# Patient Record
Sex: Female | Born: 1940
Health system: Southern US, Community
[De-identification: ages and names within clinical notes are randomized; demographics above are authoritative.]

## PROBLEM LIST (undated history)

## (undated) DIAGNOSIS — I639 Cerebral infarction, unspecified: Secondary | ICD-10-CM

## (undated) DIAGNOSIS — I219 Acute myocardial infarction, unspecified: Secondary | ICD-10-CM

## (undated) DIAGNOSIS — M1990 Unspecified osteoarthritis, unspecified site: Secondary | ICD-10-CM

## (undated) HISTORY — DX: Cerebral infarction, unspecified: I63.9

## (undated) HISTORY — PX: TOTAL KNEE ARTHROPLASTY: SHX125

## (undated) HISTORY — PX: COLON SURGERY: SHX602

## (undated) HISTORY — PX: JOINT REPLACEMENT: SHX530

---
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
## 2015-12-03 DIAGNOSIS — I10 Essential (primary) hypertension: Secondary | ICD-10-CM | POA: Diagnosis not present

## 2015-12-03 DIAGNOSIS — M5417 Radiculopathy, lumbosacral region: Secondary | ICD-10-CM | POA: Diagnosis not present

## 2015-12-03 DIAGNOSIS — Z6832 Body mass index (BMI) 32.0-32.9, adult: Secondary | ICD-10-CM | POA: Diagnosis not present

## 2015-12-03 DIAGNOSIS — R42 Dizziness and giddiness: Secondary | ICD-10-CM | POA: Diagnosis not present

## 2015-12-03 DIAGNOSIS — M25562 Pain in left knee: Secondary | ICD-10-CM | POA: Diagnosis not present

## 2015-12-03 DIAGNOSIS — E8881 Metabolic syndrome: Secondary | ICD-10-CM | POA: Diagnosis not present

## 2015-12-03 DIAGNOSIS — M810 Age-related osteoporosis without current pathological fracture: Secondary | ICD-10-CM | POA: Diagnosis not present

## 2015-12-03 DIAGNOSIS — R946 Abnormal results of thyroid function studies: Secondary | ICD-10-CM | POA: Diagnosis not present

## 2015-12-03 DIAGNOSIS — M159 Polyosteoarthritis, unspecified: Secondary | ICD-10-CM | POA: Diagnosis not present

## 2015-12-09 DIAGNOSIS — H8110 Benign paroxysmal vertigo, unspecified ear: Secondary | ICD-10-CM | POA: Diagnosis not present

## 2015-12-09 DIAGNOSIS — R42 Dizziness and giddiness: Secondary | ICD-10-CM | POA: Diagnosis not present

## 2015-12-10 DIAGNOSIS — M5417 Radiculopathy, lumbosacral region: Secondary | ICD-10-CM | POA: Diagnosis not present

## 2015-12-10 DIAGNOSIS — M25562 Pain in left knee: Secondary | ICD-10-CM | POA: Diagnosis not present

## 2015-12-10 DIAGNOSIS — M159 Polyosteoarthritis, unspecified: Secondary | ICD-10-CM | POA: Diagnosis not present

## 2015-12-10 DIAGNOSIS — M810 Age-related osteoporosis without current pathological fracture: Secondary | ICD-10-CM | POA: Diagnosis not present

## 2015-12-10 DIAGNOSIS — I10 Essential (primary) hypertension: Secondary | ICD-10-CM | POA: Diagnosis not present

## 2015-12-10 DIAGNOSIS — R946 Abnormal results of thyroid function studies: Secondary | ICD-10-CM | POA: Diagnosis not present

## 2015-12-10 DIAGNOSIS — E8881 Metabolic syndrome: Secondary | ICD-10-CM | POA: Diagnosis not present

## 2015-12-10 DIAGNOSIS — I2510 Atherosclerotic heart disease of native coronary artery without angina pectoris: Secondary | ICD-10-CM | POA: Diagnosis not present

## 2015-12-10 DIAGNOSIS — Z6832 Body mass index (BMI) 32.0-32.9, adult: Secondary | ICD-10-CM | POA: Diagnosis not present

## 2015-12-10 DIAGNOSIS — R42 Dizziness and giddiness: Secondary | ICD-10-CM | POA: Diagnosis not present

## 2015-12-16 DIAGNOSIS — R42 Dizziness and giddiness: Secondary | ICD-10-CM | POA: Diagnosis not present

## 2015-12-16 DIAGNOSIS — H8112 Benign paroxysmal vertigo, left ear: Secondary | ICD-10-CM | POA: Diagnosis not present

## 2015-12-23 DIAGNOSIS — M1712 Unilateral primary osteoarthritis, left knee: Secondary | ICD-10-CM | POA: Diagnosis not present

## 2016-01-06 DIAGNOSIS — R42 Dizziness and giddiness: Secondary | ICD-10-CM | POA: Diagnosis not present

## 2016-01-06 DIAGNOSIS — H8112 Benign paroxysmal vertigo, left ear: Secondary | ICD-10-CM | POA: Diagnosis not present

## 2016-01-13 DIAGNOSIS — H8112 Benign paroxysmal vertigo, left ear: Secondary | ICD-10-CM | POA: Diagnosis not present

## 2016-03-11 DIAGNOSIS — R946 Abnormal results of thyroid function studies: Secondary | ICD-10-CM | POA: Diagnosis not present

## 2016-03-11 DIAGNOSIS — Z9181 History of falling: Secondary | ICD-10-CM | POA: Diagnosis not present

## 2016-03-11 DIAGNOSIS — Z6832 Body mass index (BMI) 32.0-32.9, adult: Secondary | ICD-10-CM | POA: Diagnosis not present

## 2016-03-11 DIAGNOSIS — Z1389 Encounter for screening for other disorder: Secondary | ICD-10-CM | POA: Diagnosis not present

## 2016-03-11 DIAGNOSIS — M5417 Radiculopathy, lumbosacral region: Secondary | ICD-10-CM | POA: Diagnosis not present

## 2016-03-11 DIAGNOSIS — M810 Age-related osteoporosis without current pathological fracture: Secondary | ICD-10-CM | POA: Diagnosis not present

## 2016-03-11 DIAGNOSIS — E8881 Metabolic syndrome: Secondary | ICD-10-CM | POA: Diagnosis not present

## 2016-03-11 DIAGNOSIS — I10 Essential (primary) hypertension: Secondary | ICD-10-CM | POA: Diagnosis not present

## 2016-03-11 DIAGNOSIS — M159 Polyosteoarthritis, unspecified: Secondary | ICD-10-CM | POA: Diagnosis not present

## 2016-03-11 DIAGNOSIS — R42 Dizziness and giddiness: Secondary | ICD-10-CM | POA: Diagnosis not present

## 2016-05-03 DIAGNOSIS — M1712 Unilateral primary osteoarthritis, left knee: Secondary | ICD-10-CM | POA: Diagnosis not present

## 2016-05-05 DIAGNOSIS — Z79899 Other long term (current) drug therapy: Secondary | ICD-10-CM | POA: Diagnosis not present

## 2016-05-05 DIAGNOSIS — M79609 Pain in unspecified limb: Secondary | ICD-10-CM | POA: Diagnosis not present

## 2016-05-05 DIAGNOSIS — Z01818 Encounter for other preprocedural examination: Secondary | ICD-10-CM | POA: Diagnosis not present

## 2016-05-05 DIAGNOSIS — R52 Pain, unspecified: Secondary | ICD-10-CM | POA: Diagnosis not present

## 2016-05-05 DIAGNOSIS — Z01810 Encounter for preprocedural cardiovascular examination: Secondary | ICD-10-CM | POA: Diagnosis not present

## 2016-05-10 DIAGNOSIS — E785 Hyperlipidemia, unspecified: Secondary | ICD-10-CM | POA: Diagnosis not present

## 2016-05-10 DIAGNOSIS — E8881 Metabolic syndrome: Secondary | ICD-10-CM | POA: Diagnosis not present

## 2016-05-10 DIAGNOSIS — E669 Obesity, unspecified: Secondary | ICD-10-CM | POA: Diagnosis not present

## 2016-05-10 DIAGNOSIS — Z01810 Encounter for preprocedural cardiovascular examination: Secondary | ICD-10-CM | POA: Diagnosis not present

## 2016-05-10 DIAGNOSIS — M5417 Radiculopathy, lumbosacral region: Secondary | ICD-10-CM | POA: Diagnosis not present

## 2016-05-10 DIAGNOSIS — I2510 Atherosclerotic heart disease of native coronary artery without angina pectoris: Secondary | ICD-10-CM

## 2016-05-10 DIAGNOSIS — M159 Polyosteoarthritis, unspecified: Secondary | ICD-10-CM | POA: Diagnosis not present

## 2016-05-10 DIAGNOSIS — Z01818 Encounter for other preprocedural examination: Secondary | ICD-10-CM | POA: Diagnosis not present

## 2016-05-10 DIAGNOSIS — R42 Dizziness and giddiness: Secondary | ICD-10-CM | POA: Diagnosis not present

## 2016-05-10 DIAGNOSIS — I10 Essential (primary) hypertension: Secondary | ICD-10-CM | POA: Diagnosis not present

## 2016-05-10 DIAGNOSIS — R946 Abnormal results of thyroid function studies: Secondary | ICD-10-CM | POA: Diagnosis not present

## 2016-05-10 DIAGNOSIS — M810 Age-related osteoporosis without current pathological fracture: Secondary | ICD-10-CM | POA: Diagnosis not present

## 2016-05-10 HISTORY — DX: Atherosclerotic heart disease of native coronary artery without angina pectoris: I25.10

## 2016-05-11 DIAGNOSIS — I209 Angina pectoris, unspecified: Secondary | ICD-10-CM | POA: Diagnosis not present

## 2016-05-11 DIAGNOSIS — M059 Rheumatoid arthritis with rheumatoid factor, unspecified: Secondary | ICD-10-CM | POA: Diagnosis not present

## 2016-05-11 DIAGNOSIS — Z0000 Encounter for general adult medical examination without abnormal findings: Secondary | ICD-10-CM | POA: Diagnosis not present

## 2016-05-11 DIAGNOSIS — I252 Old myocardial infarction: Secondary | ICD-10-CM | POA: Diagnosis not present

## 2016-05-14 DIAGNOSIS — R42 Dizziness and giddiness: Secondary | ICD-10-CM | POA: Diagnosis not present

## 2016-05-14 DIAGNOSIS — I252 Old myocardial infarction: Secondary | ICD-10-CM | POA: Diagnosis not present

## 2016-05-14 DIAGNOSIS — R0789 Other chest pain: Secondary | ICD-10-CM | POA: Diagnosis not present

## 2016-05-14 DIAGNOSIS — I10 Essential (primary) hypertension: Secondary | ICD-10-CM | POA: Diagnosis not present

## 2016-05-21 DIAGNOSIS — Z01810 Encounter for preprocedural cardiovascular examination: Secondary | ICD-10-CM | POA: Diagnosis not present

## 2016-05-21 DIAGNOSIS — R079 Chest pain, unspecified: Secondary | ICD-10-CM | POA: Diagnosis not present

## 2016-05-21 DIAGNOSIS — Z01818 Encounter for other preprocedural examination: Secondary | ICD-10-CM | POA: Diagnosis not present

## 2016-05-21 DIAGNOSIS — E785 Hyperlipidemia, unspecified: Secondary | ICD-10-CM | POA: Diagnosis not present

## 2016-05-21 DIAGNOSIS — I2510 Atherosclerotic heart disease of native coronary artery without angina pectoris: Secondary | ICD-10-CM | POA: Diagnosis not present

## 2016-06-01 DIAGNOSIS — G43109 Migraine with aura, not intractable, without status migrainosus: Secondary | ICD-10-CM | POA: Diagnosis not present

## 2016-06-01 DIAGNOSIS — H8112 Benign paroxysmal vertigo, left ear: Secondary | ICD-10-CM | POA: Diagnosis not present

## 2016-06-04 DIAGNOSIS — M1712 Unilateral primary osteoarthritis, left knee: Secondary | ICD-10-CM | POA: Diagnosis not present

## 2016-06-04 DIAGNOSIS — Z01818 Encounter for other preprocedural examination: Secondary | ICD-10-CM | POA: Diagnosis not present

## 2016-06-08 DIAGNOSIS — H8112 Benign paroxysmal vertigo, left ear: Secondary | ICD-10-CM | POA: Diagnosis not present

## 2016-06-08 DIAGNOSIS — R51 Headache: Secondary | ICD-10-CM | POA: Diagnosis not present

## 2016-06-08 DIAGNOSIS — H903 Sensorineural hearing loss, bilateral: Secondary | ICD-10-CM | POA: Diagnosis not present

## 2016-06-10 DIAGNOSIS — E785 Hyperlipidemia, unspecified: Secondary | ICD-10-CM | POA: Diagnosis not present

## 2016-06-10 DIAGNOSIS — Z01818 Encounter for other preprocedural examination: Secondary | ICD-10-CM | POA: Diagnosis not present

## 2016-06-10 DIAGNOSIS — I2510 Atherosclerotic heart disease of native coronary artery without angina pectoris: Secondary | ICD-10-CM | POA: Diagnosis not present

## 2016-06-15 DIAGNOSIS — H8112 Benign paroxysmal vertigo, left ear: Secondary | ICD-10-CM | POA: Diagnosis not present

## 2016-06-15 DIAGNOSIS — R42 Dizziness and giddiness: Secondary | ICD-10-CM | POA: Diagnosis not present

## 2016-06-16 DIAGNOSIS — H905 Unspecified sensorineural hearing loss: Secondary | ICD-10-CM | POA: Diagnosis not present

## 2016-06-16 DIAGNOSIS — I252 Old myocardial infarction: Secondary | ICD-10-CM | POA: Diagnosis not present

## 2016-06-16 DIAGNOSIS — Z9861 Coronary angioplasty status: Secondary | ICD-10-CM | POA: Diagnosis not present

## 2016-06-16 DIAGNOSIS — M1712 Unilateral primary osteoarthritis, left knee: Secondary | ICD-10-CM | POA: Diagnosis not present

## 2016-06-16 DIAGNOSIS — G8918 Other acute postprocedural pain: Secondary | ICD-10-CM | POA: Diagnosis not present

## 2016-06-16 DIAGNOSIS — E079 Disorder of thyroid, unspecified: Secondary | ICD-10-CM | POA: Diagnosis not present

## 2016-06-16 DIAGNOSIS — Z96652 Presence of left artificial knee joint: Secondary | ICD-10-CM | POA: Diagnosis not present

## 2016-06-16 DIAGNOSIS — Z882 Allergy status to sulfonamides status: Secondary | ICD-10-CM | POA: Diagnosis not present

## 2016-06-16 DIAGNOSIS — Z86718 Personal history of other venous thrombosis and embolism: Secondary | ICD-10-CM | POA: Diagnosis not present

## 2016-06-16 DIAGNOSIS — Z471 Aftercare following joint replacement surgery: Secondary | ICD-10-CM | POA: Diagnosis not present

## 2016-06-16 DIAGNOSIS — I2510 Atherosclerotic heart disease of native coronary artery without angina pectoris: Secondary | ICD-10-CM | POA: Diagnosis not present

## 2016-06-19 DIAGNOSIS — Z471 Aftercare following joint replacement surgery: Secondary | ICD-10-CM | POA: Diagnosis not present

## 2016-06-21 DIAGNOSIS — Z471 Aftercare following joint replacement surgery: Secondary | ICD-10-CM | POA: Diagnosis not present

## 2016-06-22 DIAGNOSIS — Z471 Aftercare following joint replacement surgery: Secondary | ICD-10-CM | POA: Diagnosis not present

## 2016-06-23 DIAGNOSIS — Z471 Aftercare following joint replacement surgery: Secondary | ICD-10-CM | POA: Diagnosis not present

## 2016-06-24 DIAGNOSIS — Z471 Aftercare following joint replacement surgery: Secondary | ICD-10-CM | POA: Diagnosis not present

## 2016-06-25 DIAGNOSIS — R600 Localized edema: Secondary | ICD-10-CM | POA: Diagnosis not present

## 2016-06-25 DIAGNOSIS — M79662 Pain in left lower leg: Secondary | ICD-10-CM | POA: Diagnosis not present

## 2016-06-25 DIAGNOSIS — Z471 Aftercare following joint replacement surgery: Secondary | ICD-10-CM | POA: Diagnosis not present

## 2016-06-28 DIAGNOSIS — Z471 Aftercare following joint replacement surgery: Secondary | ICD-10-CM | POA: Diagnosis not present

## 2016-07-29 DIAGNOSIS — M25662 Stiffness of left knee, not elsewhere classified: Secondary | ICD-10-CM | POA: Diagnosis not present

## 2016-07-29 DIAGNOSIS — R262 Difficulty in walking, not elsewhere classified: Secondary | ICD-10-CM | POA: Diagnosis not present

## 2016-07-29 DIAGNOSIS — M25562 Pain in left knee: Secondary | ICD-10-CM | POA: Diagnosis not present

## 2016-07-30 DIAGNOSIS — Z96652 Presence of left artificial knee joint: Secondary | ICD-10-CM | POA: Diagnosis not present

## 2016-08-03 DIAGNOSIS — M25662 Stiffness of left knee, not elsewhere classified: Secondary | ICD-10-CM | POA: Diagnosis not present

## 2016-08-03 DIAGNOSIS — R262 Difficulty in walking, not elsewhere classified: Secondary | ICD-10-CM | POA: Diagnosis not present

## 2016-08-03 DIAGNOSIS — M25562 Pain in left knee: Secondary | ICD-10-CM | POA: Diagnosis not present

## 2016-08-05 DIAGNOSIS — M25562 Pain in left knee: Secondary | ICD-10-CM | POA: Diagnosis not present

## 2016-08-05 DIAGNOSIS — R262 Difficulty in walking, not elsewhere classified: Secondary | ICD-10-CM | POA: Diagnosis not present

## 2016-08-05 DIAGNOSIS — M25662 Stiffness of left knee, not elsewhere classified: Secondary | ICD-10-CM | POA: Diagnosis not present

## 2016-08-10 DIAGNOSIS — R262 Difficulty in walking, not elsewhere classified: Secondary | ICD-10-CM | POA: Diagnosis not present

## 2016-08-10 DIAGNOSIS — M25662 Stiffness of left knee, not elsewhere classified: Secondary | ICD-10-CM | POA: Diagnosis not present

## 2016-08-10 DIAGNOSIS — M25562 Pain in left knee: Secondary | ICD-10-CM | POA: Diagnosis not present

## 2016-09-10 DIAGNOSIS — M5417 Radiculopathy, lumbosacral region: Secondary | ICD-10-CM | POA: Diagnosis not present

## 2016-09-10 DIAGNOSIS — Z96652 Presence of left artificial knee joint: Secondary | ICD-10-CM | POA: Diagnosis not present

## 2016-09-10 DIAGNOSIS — M1712 Unilateral primary osteoarthritis, left knee: Secondary | ICD-10-CM | POA: Diagnosis not present

## 2016-09-24 DIAGNOSIS — M48061 Spinal stenosis, lumbar region without neurogenic claudication: Secondary | ICD-10-CM | POA: Diagnosis not present

## 2016-09-24 DIAGNOSIS — M5416 Radiculopathy, lumbar region: Secondary | ICD-10-CM | POA: Diagnosis not present

## 2016-09-28 DIAGNOSIS — Z96652 Presence of left artificial knee joint: Secondary | ICD-10-CM | POA: Diagnosis not present

## 2016-09-28 DIAGNOSIS — M1712 Unilateral primary osteoarthritis, left knee: Secondary | ICD-10-CM | POA: Diagnosis not present

## 2016-09-28 DIAGNOSIS — M5417 Radiculopathy, lumbosacral region: Secondary | ICD-10-CM | POA: Diagnosis not present

## 2016-10-06 DIAGNOSIS — M5417 Radiculopathy, lumbosacral region: Secondary | ICD-10-CM | POA: Diagnosis not present

## 2016-12-10 DIAGNOSIS — M5417 Radiculopathy, lumbosacral region: Secondary | ICD-10-CM | POA: Diagnosis not present

## 2016-12-10 DIAGNOSIS — Z96652 Presence of left artificial knee joint: Secondary | ICD-10-CM | POA: Diagnosis not present

## 2016-12-10 DIAGNOSIS — M1712 Unilateral primary osteoarthritis, left knee: Secondary | ICD-10-CM | POA: Diagnosis not present

## 2017-06-13 DIAGNOSIS — Z96652 Presence of left artificial knee joint: Secondary | ICD-10-CM | POA: Diagnosis not present

## 2018-06-01 DIAGNOSIS — M1711 Unilateral primary osteoarthritis, right knee: Secondary | ICD-10-CM | POA: Diagnosis not present

## 2018-06-08 DIAGNOSIS — R52 Pain, unspecified: Secondary | ICD-10-CM | POA: Diagnosis not present

## 2018-06-08 DIAGNOSIS — Z79899 Other long term (current) drug therapy: Secondary | ICD-10-CM | POA: Diagnosis not present

## 2018-06-08 DIAGNOSIS — Z01818 Encounter for other preprocedural examination: Secondary | ICD-10-CM | POA: Diagnosis not present

## 2018-06-08 DIAGNOSIS — J9811 Atelectasis: Secondary | ICD-10-CM | POA: Diagnosis not present

## 2018-06-08 DIAGNOSIS — M79609 Pain in unspecified limb: Secondary | ICD-10-CM | POA: Diagnosis not present

## 2018-06-13 DIAGNOSIS — Z01818 Encounter for other preprocedural examination: Secondary | ICD-10-CM | POA: Diagnosis not present

## 2018-06-13 DIAGNOSIS — I2510 Atherosclerotic heart disease of native coronary artery without angina pectoris: Secondary | ICD-10-CM | POA: Diagnosis not present

## 2018-06-13 DIAGNOSIS — L2081 Atopic neurodermatitis: Secondary | ICD-10-CM | POA: Diagnosis not present

## 2018-06-13 DIAGNOSIS — M1711 Unilateral primary osteoarthritis, right knee: Secondary | ICD-10-CM | POA: Diagnosis not present

## 2018-06-14 DIAGNOSIS — Z01810 Encounter for preprocedural cardiovascular examination: Secondary | ICD-10-CM | POA: Diagnosis not present

## 2018-07-05 ENCOUNTER — Ambulatory Visit: Payer: Medicare HMO | Admitting: Cardiology

## 2018-07-19 DIAGNOSIS — Z86718 Personal history of other venous thrombosis and embolism: Secondary | ICD-10-CM | POA: Diagnosis not present

## 2018-07-19 DIAGNOSIS — G8918 Other acute postprocedural pain: Secondary | ICD-10-CM | POA: Diagnosis not present

## 2018-07-19 DIAGNOSIS — Z8679 Personal history of other diseases of the circulatory system: Secondary | ICD-10-CM | POA: Diagnosis not present

## 2018-07-19 DIAGNOSIS — Z23 Encounter for immunization: Secondary | ICD-10-CM | POA: Diagnosis not present

## 2018-07-19 DIAGNOSIS — Z96652 Presence of left artificial knee joint: Secondary | ICD-10-CM | POA: Diagnosis not present

## 2018-07-19 DIAGNOSIS — I2510 Atherosclerotic heart disease of native coronary artery without angina pectoris: Secondary | ICD-10-CM | POA: Diagnosis not present

## 2018-07-19 DIAGNOSIS — M1711 Unilateral primary osteoarthritis, right knee: Secondary | ICD-10-CM | POA: Diagnosis not present

## 2018-07-19 DIAGNOSIS — Z7982 Long term (current) use of aspirin: Secondary | ICD-10-CM | POA: Diagnosis not present

## 2018-07-19 DIAGNOSIS — Z96651 Presence of right artificial knee joint: Secondary | ICD-10-CM | POA: Diagnosis not present

## 2018-07-19 DIAGNOSIS — I252 Old myocardial infarction: Secondary | ICD-10-CM | POA: Diagnosis not present

## 2018-07-19 DIAGNOSIS — I10 Essential (primary) hypertension: Secondary | ICD-10-CM | POA: Diagnosis not present

## 2018-07-19 DIAGNOSIS — Z471 Aftercare following joint replacement surgery: Secondary | ICD-10-CM | POA: Diagnosis not present

## 2018-07-22 DIAGNOSIS — Z79891 Long term (current) use of opiate analgesic: Secondary | ICD-10-CM | POA: Diagnosis not present

## 2018-07-22 DIAGNOSIS — Z471 Aftercare following joint replacement surgery: Secondary | ICD-10-CM | POA: Diagnosis not present

## 2018-07-22 DIAGNOSIS — I2510 Atherosclerotic heart disease of native coronary artery without angina pectoris: Secondary | ICD-10-CM | POA: Diagnosis not present

## 2018-07-22 DIAGNOSIS — M1991 Primary osteoarthritis, unspecified site: Secondary | ICD-10-CM | POA: Diagnosis not present

## 2018-07-22 DIAGNOSIS — Z7901 Long term (current) use of anticoagulants: Secondary | ICD-10-CM | POA: Diagnosis not present

## 2018-07-22 DIAGNOSIS — Z9181 History of falling: Secondary | ICD-10-CM | POA: Diagnosis not present

## 2018-07-22 DIAGNOSIS — Z96653 Presence of artificial knee joint, bilateral: Secondary | ICD-10-CM | POA: Diagnosis not present

## 2018-07-22 DIAGNOSIS — H8112 Benign paroxysmal vertigo, left ear: Secondary | ICD-10-CM | POA: Diagnosis not present

## 2018-07-22 DIAGNOSIS — M5416 Radiculopathy, lumbar region: Secondary | ICD-10-CM | POA: Diagnosis not present

## 2018-07-22 DIAGNOSIS — H902 Conductive hearing loss, unspecified: Secondary | ICD-10-CM | POA: Diagnosis not present

## 2018-07-25 DIAGNOSIS — I2510 Atherosclerotic heart disease of native coronary artery without angina pectoris: Secondary | ICD-10-CM | POA: Diagnosis not present

## 2018-07-25 DIAGNOSIS — Z79891 Long term (current) use of opiate analgesic: Secondary | ICD-10-CM | POA: Diagnosis not present

## 2018-07-25 DIAGNOSIS — M1991 Primary osteoarthritis, unspecified site: Secondary | ICD-10-CM | POA: Diagnosis not present

## 2018-07-25 DIAGNOSIS — H902 Conductive hearing loss, unspecified: Secondary | ICD-10-CM | POA: Diagnosis not present

## 2018-07-25 DIAGNOSIS — Z9181 History of falling: Secondary | ICD-10-CM | POA: Diagnosis not present

## 2018-07-25 DIAGNOSIS — Z471 Aftercare following joint replacement surgery: Secondary | ICD-10-CM | POA: Diagnosis not present

## 2018-07-25 DIAGNOSIS — Z96653 Presence of artificial knee joint, bilateral: Secondary | ICD-10-CM | POA: Diagnosis not present

## 2018-07-25 DIAGNOSIS — M5416 Radiculopathy, lumbar region: Secondary | ICD-10-CM | POA: Diagnosis not present

## 2018-07-25 DIAGNOSIS — H8112 Benign paroxysmal vertigo, left ear: Secondary | ICD-10-CM | POA: Diagnosis not present

## 2018-07-25 DIAGNOSIS — Z7901 Long term (current) use of anticoagulants: Secondary | ICD-10-CM | POA: Diagnosis not present

## 2018-07-26 DIAGNOSIS — H902 Conductive hearing loss, unspecified: Secondary | ICD-10-CM | POA: Diagnosis not present

## 2018-07-26 DIAGNOSIS — Z471 Aftercare following joint replacement surgery: Secondary | ICD-10-CM | POA: Diagnosis not present

## 2018-07-26 DIAGNOSIS — Z7901 Long term (current) use of anticoagulants: Secondary | ICD-10-CM | POA: Diagnosis not present

## 2018-07-26 DIAGNOSIS — I2510 Atherosclerotic heart disease of native coronary artery without angina pectoris: Secondary | ICD-10-CM | POA: Diagnosis not present

## 2018-07-26 DIAGNOSIS — Z9181 History of falling: Secondary | ICD-10-CM | POA: Diagnosis not present

## 2018-07-26 DIAGNOSIS — M5416 Radiculopathy, lumbar region: Secondary | ICD-10-CM | POA: Diagnosis not present

## 2018-07-26 DIAGNOSIS — H8112 Benign paroxysmal vertigo, left ear: Secondary | ICD-10-CM | POA: Diagnosis not present

## 2018-07-26 DIAGNOSIS — M1991 Primary osteoarthritis, unspecified site: Secondary | ICD-10-CM | POA: Diagnosis not present

## 2018-07-26 DIAGNOSIS — Z96653 Presence of artificial knee joint, bilateral: Secondary | ICD-10-CM | POA: Diagnosis not present

## 2018-07-26 DIAGNOSIS — Z79891 Long term (current) use of opiate analgesic: Secondary | ICD-10-CM | POA: Diagnosis not present

## 2018-07-27 DIAGNOSIS — M1991 Primary osteoarthritis, unspecified site: Secondary | ICD-10-CM | POA: Diagnosis not present

## 2018-07-27 DIAGNOSIS — Z7901 Long term (current) use of anticoagulants: Secondary | ICD-10-CM | POA: Diagnosis not present

## 2018-07-27 DIAGNOSIS — M5416 Radiculopathy, lumbar region: Secondary | ICD-10-CM | POA: Diagnosis not present

## 2018-07-27 DIAGNOSIS — I2510 Atherosclerotic heart disease of native coronary artery without angina pectoris: Secondary | ICD-10-CM | POA: Diagnosis not present

## 2018-07-27 DIAGNOSIS — H8112 Benign paroxysmal vertigo, left ear: Secondary | ICD-10-CM | POA: Diagnosis not present

## 2018-07-27 DIAGNOSIS — Z79891 Long term (current) use of opiate analgesic: Secondary | ICD-10-CM | POA: Diagnosis not present

## 2018-07-27 DIAGNOSIS — H902 Conductive hearing loss, unspecified: Secondary | ICD-10-CM | POA: Diagnosis not present

## 2018-07-27 DIAGNOSIS — Z96653 Presence of artificial knee joint, bilateral: Secondary | ICD-10-CM | POA: Diagnosis not present

## 2018-07-27 DIAGNOSIS — Z471 Aftercare following joint replacement surgery: Secondary | ICD-10-CM | POA: Diagnosis not present

## 2018-07-27 DIAGNOSIS — Z9181 History of falling: Secondary | ICD-10-CM | POA: Diagnosis not present

## 2018-08-01 DIAGNOSIS — M1991 Primary osteoarthritis, unspecified site: Secondary | ICD-10-CM | POA: Diagnosis not present

## 2018-08-01 DIAGNOSIS — Z96653 Presence of artificial knee joint, bilateral: Secondary | ICD-10-CM | POA: Diagnosis not present

## 2018-08-01 DIAGNOSIS — Z79891 Long term (current) use of opiate analgesic: Secondary | ICD-10-CM | POA: Diagnosis not present

## 2018-08-01 DIAGNOSIS — M5416 Radiculopathy, lumbar region: Secondary | ICD-10-CM | POA: Diagnosis not present

## 2018-08-01 DIAGNOSIS — I2510 Atherosclerotic heart disease of native coronary artery without angina pectoris: Secondary | ICD-10-CM | POA: Diagnosis not present

## 2018-08-01 DIAGNOSIS — Z471 Aftercare following joint replacement surgery: Secondary | ICD-10-CM | POA: Diagnosis not present

## 2018-08-01 DIAGNOSIS — H902 Conductive hearing loss, unspecified: Secondary | ICD-10-CM | POA: Diagnosis not present

## 2018-08-01 DIAGNOSIS — Z7901 Long term (current) use of anticoagulants: Secondary | ICD-10-CM | POA: Diagnosis not present

## 2018-08-01 DIAGNOSIS — Z9181 History of falling: Secondary | ICD-10-CM | POA: Diagnosis not present

## 2018-08-01 DIAGNOSIS — H8112 Benign paroxysmal vertigo, left ear: Secondary | ICD-10-CM | POA: Diagnosis not present

## 2018-08-02 DIAGNOSIS — M1711 Unilateral primary osteoarthritis, right knee: Secondary | ICD-10-CM | POA: Diagnosis not present

## 2018-08-02 DIAGNOSIS — M25561 Pain in right knee: Secondary | ICD-10-CM | POA: Diagnosis not present

## 2018-08-04 DIAGNOSIS — M1711 Unilateral primary osteoarthritis, right knee: Secondary | ICD-10-CM | POA: Diagnosis not present

## 2018-08-04 DIAGNOSIS — M25561 Pain in right knee: Secondary | ICD-10-CM | POA: Diagnosis not present

## 2018-08-07 DIAGNOSIS — M25561 Pain in right knee: Secondary | ICD-10-CM | POA: Diagnosis not present

## 2018-08-07 DIAGNOSIS — M1711 Unilateral primary osteoarthritis, right knee: Secondary | ICD-10-CM | POA: Diagnosis not present

## 2018-08-09 DIAGNOSIS — M25561 Pain in right knee: Secondary | ICD-10-CM | POA: Diagnosis not present

## 2018-08-09 DIAGNOSIS — M1711 Unilateral primary osteoarthritis, right knee: Secondary | ICD-10-CM | POA: Diagnosis not present

## 2018-08-14 DIAGNOSIS — M25561 Pain in right knee: Secondary | ICD-10-CM | POA: Diagnosis not present

## 2018-08-14 DIAGNOSIS — M1711 Unilateral primary osteoarthritis, right knee: Secondary | ICD-10-CM | POA: Diagnosis not present

## 2018-08-16 DIAGNOSIS — M25561 Pain in right knee: Secondary | ICD-10-CM | POA: Diagnosis not present

## 2018-08-16 DIAGNOSIS — M1711 Unilateral primary osteoarthritis, right knee: Secondary | ICD-10-CM | POA: Diagnosis not present

## 2018-08-23 DIAGNOSIS — M25561 Pain in right knee: Secondary | ICD-10-CM | POA: Diagnosis not present

## 2018-08-23 DIAGNOSIS — M1711 Unilateral primary osteoarthritis, right knee: Secondary | ICD-10-CM | POA: Diagnosis not present

## 2018-08-28 DIAGNOSIS — M1711 Unilateral primary osteoarthritis, right knee: Secondary | ICD-10-CM | POA: Diagnosis not present

## 2018-08-28 DIAGNOSIS — M25561 Pain in right knee: Secondary | ICD-10-CM | POA: Diagnosis not present

## 2018-08-29 DIAGNOSIS — M25561 Pain in right knee: Secondary | ICD-10-CM | POA: Diagnosis not present

## 2018-08-29 DIAGNOSIS — Z96651 Presence of right artificial knee joint: Secondary | ICD-10-CM | POA: Diagnosis not present

## 2018-09-01 DIAGNOSIS — M25561 Pain in right knee: Secondary | ICD-10-CM | POA: Diagnosis not present

## 2018-09-01 DIAGNOSIS — M1711 Unilateral primary osteoarthritis, right knee: Secondary | ICD-10-CM | POA: Diagnosis not present

## 2018-09-07 DIAGNOSIS — Z809 Family history of malignant neoplasm, unspecified: Secondary | ICD-10-CM | POA: Diagnosis not present

## 2018-09-07 DIAGNOSIS — Z7982 Long term (current) use of aspirin: Secondary | ICD-10-CM | POA: Diagnosis not present

## 2018-09-07 DIAGNOSIS — I252 Old myocardial infarction: Secondary | ICD-10-CM | POA: Diagnosis not present

## 2018-09-07 DIAGNOSIS — Z803 Family history of malignant neoplasm of breast: Secondary | ICD-10-CM | POA: Diagnosis not present

## 2018-09-07 DIAGNOSIS — E669 Obesity, unspecified: Secondary | ICD-10-CM | POA: Diagnosis not present

## 2018-09-07 DIAGNOSIS — Z6832 Body mass index (BMI) 32.0-32.9, adult: Secondary | ICD-10-CM | POA: Diagnosis not present

## 2018-09-07 DIAGNOSIS — Z8249 Family history of ischemic heart disease and other diseases of the circulatory system: Secondary | ICD-10-CM | POA: Diagnosis not present

## 2018-09-07 DIAGNOSIS — Z833 Family history of diabetes mellitus: Secondary | ICD-10-CM | POA: Diagnosis not present

## 2018-09-07 DIAGNOSIS — M1990 Unspecified osteoarthritis, unspecified site: Secondary | ICD-10-CM | POA: Diagnosis not present

## 2018-09-07 DIAGNOSIS — L089 Local infection of the skin and subcutaneous tissue, unspecified: Secondary | ICD-10-CM | POA: Diagnosis not present

## 2018-09-08 DIAGNOSIS — M545 Low back pain: Secondary | ICD-10-CM | POA: Diagnosis not present

## 2018-09-08 DIAGNOSIS — M47896 Other spondylosis, lumbar region: Secondary | ICD-10-CM | POA: Diagnosis not present

## 2018-09-08 DIAGNOSIS — M5116 Intervertebral disc disorders with radiculopathy, lumbar region: Secondary | ICD-10-CM | POA: Diagnosis not present

## 2018-09-14 DIAGNOSIS — M5416 Radiculopathy, lumbar region: Secondary | ICD-10-CM | POA: Diagnosis not present

## 2018-10-05 DIAGNOSIS — I361 Nonrheumatic tricuspid (valve) insufficiency: Secondary | ICD-10-CM | POA: Diagnosis not present

## 2018-10-05 DIAGNOSIS — I10 Essential (primary) hypertension: Secondary | ICD-10-CM | POA: Diagnosis not present

## 2018-10-05 DIAGNOSIS — G4489 Other headache syndrome: Secondary | ICD-10-CM | POA: Diagnosis not present

## 2018-10-05 DIAGNOSIS — H8110 Benign paroxysmal vertigo, unspecified ear: Secondary | ICD-10-CM | POA: Diagnosis not present

## 2018-10-05 DIAGNOSIS — R531 Weakness: Secondary | ICD-10-CM | POA: Diagnosis not present

## 2018-10-05 DIAGNOSIS — R42 Dizziness and giddiness: Secondary | ICD-10-CM | POA: Diagnosis not present

## 2018-10-05 DIAGNOSIS — I252 Old myocardial infarction: Secondary | ICD-10-CM | POA: Diagnosis not present

## 2018-10-05 DIAGNOSIS — I639 Cerebral infarction, unspecified: Secondary | ICD-10-CM | POA: Diagnosis not present

## 2018-10-05 DIAGNOSIS — I6350 Cerebral infarction due to unspecified occlusion or stenosis of unspecified cerebral artery: Secondary | ICD-10-CM | POA: Diagnosis not present

## 2018-10-05 DIAGNOSIS — R29810 Facial weakness: Secondary | ICD-10-CM | POA: Diagnosis not present

## 2018-10-05 DIAGNOSIS — R51 Headache: Secondary | ICD-10-CM | POA: Diagnosis not present

## 2018-10-05 DIAGNOSIS — Q211 Atrial septal defect: Secondary | ICD-10-CM | POA: Diagnosis not present

## 2018-10-06 DIAGNOSIS — H538 Other visual disturbances: Secondary | ICD-10-CM | POA: Diagnosis not present

## 2018-10-06 DIAGNOSIS — I6523 Occlusion and stenosis of bilateral carotid arteries: Secondary | ICD-10-CM | POA: Diagnosis not present

## 2018-10-06 DIAGNOSIS — R42 Dizziness and giddiness: Secondary | ICD-10-CM | POA: Diagnosis not present

## 2018-10-06 DIAGNOSIS — Q211 Atrial septal defect: Secondary | ICD-10-CM | POA: Diagnosis not present

## 2018-10-06 DIAGNOSIS — I6350 Cerebral infarction due to unspecified occlusion or stenosis of unspecified cerebral artery: Secondary | ICD-10-CM | POA: Diagnosis not present

## 2018-10-06 DIAGNOSIS — R531 Weakness: Secondary | ICD-10-CM | POA: Diagnosis not present

## 2018-10-10 DIAGNOSIS — J342 Deviated nasal septum: Secondary | ICD-10-CM | POA: Diagnosis not present

## 2018-10-10 DIAGNOSIS — R42 Dizziness and giddiness: Secondary | ICD-10-CM | POA: Diagnosis not present

## 2018-10-10 DIAGNOSIS — Z87898 Personal history of other specified conditions: Secondary | ICD-10-CM | POA: Diagnosis not present

## 2018-10-10 DIAGNOSIS — Z8673 Personal history of transient ischemic attack (TIA), and cerebral infarction without residual deficits: Secondary | ICD-10-CM | POA: Diagnosis not present

## 2018-10-24 DIAGNOSIS — Z96651 Presence of right artificial knee joint: Secondary | ICD-10-CM | POA: Diagnosis not present

## 2018-11-06 DIAGNOSIS — M533 Sacrococcygeal disorders, not elsewhere classified: Secondary | ICD-10-CM | POA: Diagnosis not present

## 2018-11-06 DIAGNOSIS — I739 Peripheral vascular disease, unspecified: Secondary | ICD-10-CM | POA: Diagnosis not present

## 2018-11-06 DIAGNOSIS — G8929 Other chronic pain: Secondary | ICD-10-CM | POA: Diagnosis not present

## 2018-11-06 DIAGNOSIS — M792 Neuralgia and neuritis, unspecified: Secondary | ICD-10-CM | POA: Diagnosis not present

## 2018-11-06 DIAGNOSIS — M25561 Pain in right knee: Secondary | ICD-10-CM | POA: Diagnosis not present

## 2018-11-06 DIAGNOSIS — M48062 Spinal stenosis, lumbar region with neurogenic claudication: Secondary | ICD-10-CM | POA: Diagnosis not present

## 2018-11-23 DIAGNOSIS — M533 Sacrococcygeal disorders, not elsewhere classified: Secondary | ICD-10-CM | POA: Diagnosis not present

## 2018-11-23 DIAGNOSIS — I739 Peripheral vascular disease, unspecified: Secondary | ICD-10-CM | POA: Diagnosis not present

## 2019-01-23 DIAGNOSIS — M4726 Other spondylosis with radiculopathy, lumbar region: Secondary | ICD-10-CM | POA: Diagnosis not present

## 2019-01-23 DIAGNOSIS — M533 Sacrococcygeal disorders, not elsewhere classified: Secondary | ICD-10-CM | POA: Diagnosis not present

## 2019-01-23 DIAGNOSIS — M48062 Spinal stenosis, lumbar region with neurogenic claudication: Secondary | ICD-10-CM | POA: Diagnosis not present

## 2019-01-23 DIAGNOSIS — G894 Chronic pain syndrome: Secondary | ICD-10-CM | POA: Diagnosis not present

## 2019-01-23 DIAGNOSIS — M5416 Radiculopathy, lumbar region: Secondary | ICD-10-CM | POA: Diagnosis not present

## 2019-02-06 DIAGNOSIS — M792 Neuralgia and neuritis, unspecified: Secondary | ICD-10-CM | POA: Diagnosis not present

## 2019-02-06 DIAGNOSIS — M48062 Spinal stenosis, lumbar region with neurogenic claudication: Secondary | ICD-10-CM | POA: Diagnosis not present

## 2019-02-06 DIAGNOSIS — M5416 Radiculopathy, lumbar region: Secondary | ICD-10-CM | POA: Diagnosis not present

## 2019-02-28 DIAGNOSIS — M48062 Spinal stenosis, lumbar region with neurogenic claudication: Secondary | ICD-10-CM

## 2019-02-28 HISTORY — DX: Spinal stenosis, lumbar region with neurogenic claudication: M48.062

## 2019-04-24 DIAGNOSIS — Z96651 Presence of right artificial knee joint: Secondary | ICD-10-CM | POA: Diagnosis not present

## 2019-04-24 DIAGNOSIS — M25561 Pain in right knee: Secondary | ICD-10-CM | POA: Diagnosis not present

## 2019-04-25 DIAGNOSIS — Z79899 Other long term (current) drug therapy: Secondary | ICD-10-CM | POA: Diagnosis not present

## 2019-04-25 DIAGNOSIS — M5416 Radiculopathy, lumbar region: Secondary | ICD-10-CM | POA: Diagnosis not present

## 2019-04-25 DIAGNOSIS — M792 Neuralgia and neuritis, unspecified: Secondary | ICD-10-CM | POA: Diagnosis not present

## 2019-04-25 DIAGNOSIS — G894 Chronic pain syndrome: Secondary | ICD-10-CM | POA: Diagnosis not present

## 2019-04-25 DIAGNOSIS — M48062 Spinal stenosis, lumbar region with neurogenic claudication: Secondary | ICD-10-CM | POA: Diagnosis not present

## 2019-08-07 DIAGNOSIS — Z7982 Long term (current) use of aspirin: Secondary | ICD-10-CM | POA: Diagnosis not present

## 2019-08-07 DIAGNOSIS — I252 Old myocardial infarction: Secondary | ICD-10-CM | POA: Diagnosis not present

## 2019-08-07 DIAGNOSIS — Z803 Family history of malignant neoplasm of breast: Secondary | ICD-10-CM | POA: Diagnosis not present

## 2019-08-07 DIAGNOSIS — Z809 Family history of malignant neoplasm, unspecified: Secondary | ICD-10-CM | POA: Diagnosis not present

## 2019-08-07 DIAGNOSIS — G8929 Other chronic pain: Secondary | ICD-10-CM | POA: Diagnosis not present

## 2019-08-07 DIAGNOSIS — M1990 Unspecified osteoarthritis, unspecified site: Secondary | ICD-10-CM | POA: Diagnosis not present

## 2019-08-07 DIAGNOSIS — E669 Obesity, unspecified: Secondary | ICD-10-CM | POA: Diagnosis not present

## 2019-08-07 DIAGNOSIS — I739 Peripheral vascular disease, unspecified: Secondary | ICD-10-CM | POA: Diagnosis not present

## 2019-08-07 DIAGNOSIS — R32 Unspecified urinary incontinence: Secondary | ICD-10-CM | POA: Diagnosis not present

## 2019-08-07 DIAGNOSIS — I25119 Atherosclerotic heart disease of native coronary artery with unspecified angina pectoris: Secondary | ICD-10-CM | POA: Diagnosis not present

## 2019-08-12 ENCOUNTER — Inpatient Hospital Stay (HOSPITAL_COMMUNITY)
Admission: AD | Admit: 2019-08-12 | Discharge: 2019-08-16 | DRG: 177 | Disposition: A | Discharge status: Home / Self Care | Payer: Medicare HMO | Source: Other Acute Inpatient Hospital | Attending: Internal Medicine | Admitting: Internal Medicine

## 2019-08-12 ENCOUNTER — Encounter (HOSPITAL_COMMUNITY): Payer: Self-pay

## 2019-08-12 ENCOUNTER — Other Ambulatory Visit: Payer: Self-pay

## 2019-08-12 DIAGNOSIS — J1289 Other viral pneumonia: Secondary | ICD-10-CM | POA: Diagnosis present

## 2019-08-12 DIAGNOSIS — I2510 Atherosclerotic heart disease of native coronary artery without angina pectoris: Secondary | ICD-10-CM | POA: Diagnosis present

## 2019-08-12 DIAGNOSIS — U071 COVID-19: Secondary | ICD-10-CM

## 2019-08-12 DIAGNOSIS — T380X5A Adverse effect of glucocorticoids and synthetic analogues, initial encounter: Secondary | ICD-10-CM | POA: Diagnosis not present

## 2019-08-12 DIAGNOSIS — R739 Hyperglycemia, unspecified: Secondary | ICD-10-CM | POA: Diagnosis not present

## 2019-08-12 DIAGNOSIS — E876 Hypokalemia: Secondary | ICD-10-CM | POA: Diagnosis not present

## 2019-08-12 DIAGNOSIS — M1990 Unspecified osteoarthritis, unspecified site: Secondary | ICD-10-CM | POA: Diagnosis not present

## 2019-08-12 DIAGNOSIS — R509 Fever, unspecified: Secondary | ICD-10-CM | POA: Diagnosis not present

## 2019-08-12 DIAGNOSIS — E669 Obesity, unspecified: Secondary | ICD-10-CM | POA: Diagnosis present

## 2019-08-12 DIAGNOSIS — J1282 Pneumonia due to coronavirus disease 2019: Secondary | ICD-10-CM | POA: Diagnosis present

## 2019-08-12 DIAGNOSIS — I10 Essential (primary) hypertension: Secondary | ICD-10-CM | POA: Diagnosis not present

## 2019-08-12 DIAGNOSIS — J069 Acute upper respiratory infection, unspecified: Secondary | ICD-10-CM

## 2019-08-12 DIAGNOSIS — J9601 Acute respiratory failure with hypoxia: Secondary | ICD-10-CM | POA: Diagnosis present

## 2019-08-12 DIAGNOSIS — R7303 Prediabetes: Secondary | ICD-10-CM | POA: Diagnosis present

## 2019-08-12 DIAGNOSIS — R531 Weakness: Secondary | ICD-10-CM | POA: Diagnosis not present

## 2019-08-12 DIAGNOSIS — Z8673 Personal history of transient ischemic attack (TIA), and cerebral infarction without residual deficits: Secondary | ICD-10-CM | POA: Diagnosis not present

## 2019-08-12 DIAGNOSIS — I252 Old myocardial infarction: Secondary | ICD-10-CM | POA: Diagnosis not present

## 2019-08-12 DIAGNOSIS — Z6831 Body mass index (BMI) 31.0-31.9, adult: Secondary | ICD-10-CM | POA: Diagnosis not present

## 2019-08-12 DIAGNOSIS — R05 Cough: Secondary | ICD-10-CM | POA: Diagnosis not present

## 2019-08-12 HISTORY — DX: COVID-19: U07.1

## 2019-08-12 HISTORY — DX: Acute upper respiratory infection, unspecified: J06.9

## 2019-08-12 HISTORY — DX: Pneumonia due to coronavirus disease 2019: J12.82

## 2019-08-12 LAB — CBC WITH DIFFERENTIAL/PLATELET
Abs Immature Granulocytes: 0 10*3/uL (ref 0.00–0.07)
Basophils Absolute: 0 10*3/uL (ref 0.0–0.1)
Basophils Relative: 0 %
Eosinophils Absolute: 0 10*3/uL (ref 0.0–0.5)
Eosinophils Relative: 1 %
HCT: 42 % (ref 36.0–46.0)
Hemoglobin: 13.8 g/dL (ref 12.0–15.0)
Immature Granulocytes: 0 %
Lymphocytes Relative: 45 %
Lymphs Abs: 1.7 10*3/uL (ref 0.7–4.0)
MCH: 29.4 pg (ref 26.0–34.0)
MCHC: 32.9 g/dL (ref 30.0–36.0)
MCV: 89.6 fL (ref 80.0–100.0)
Monocytes Absolute: 0.5 10*3/uL (ref 0.1–1.0)
Monocytes Relative: 13 %
Neutro Abs: 1.6 10*3/uL — ABNORMAL LOW (ref 1.7–7.7)
Neutrophils Relative %: 41 %
Platelets: 177 10*3/uL (ref 150–400)
RBC: 4.69 MIL/uL (ref 3.87–5.11)
RDW: 12 % (ref 11.5–15.5)
WBC: 3.8 10*3/uL — ABNORMAL LOW (ref 4.0–10.5)
nRBC: 0 % (ref 0.0–0.2)

## 2019-08-12 MED ORDER — GUAIFENESIN-DM 100-10 MG/5ML PO SYRP
10.0000 mL | ORAL_SOLUTION | ORAL | Status: DC | PRN
  Administered 2019-08-13 – 2019-08-15 (×5): 10 mL via ORAL
  Filled 2019-08-12 (×5): qty 10

## 2019-08-12 MED ORDER — SODIUM CHLORIDE 0.9% FLUSH: 3.0000 mL | INTRAVENOUS | Status: DC | PRN

## 2019-08-12 MED ORDER — HYDROCOD POLST-CPM POLST ER 10-8 MG/5ML PO SUER
5.0000 mL | Freq: Two times a day (BID) | ORAL | Status: DC | PRN
  Administered 2019-08-14 – 2019-08-15 (×2): 5 mL via ORAL
  Filled 2019-08-12 (×2): qty 5

## 2019-08-12 MED ORDER — DEXAMETHASONE SODIUM PHOSPHATE 10 MG/ML IJ SOLN
6.0000 mg | INTRAMUSCULAR | Status: DC
  Administered 2019-08-12 – 2019-08-13 (×2): 6 mg via INTRAVENOUS
  Filled 2019-08-12 (×2): qty 1

## 2019-08-12 MED ORDER — SODIUM CHLORIDE 0.9 % IV SOLN
100.0000 mg | INTRAVENOUS | Status: AC
  Administered 2019-08-13 – 2019-08-16 (×4): 100 mg via INTRAVENOUS
  Filled 2019-08-12 (×5): qty 20

## 2019-08-12 MED ORDER — ZINC SULFATE 220 (50 ZN) MG PO CAPS
220.0000 mg | ORAL_CAPSULE | Freq: Every day | ORAL | Status: DC
  Administered 2019-08-13 – 2019-08-16 (×4): 220 mg via ORAL
  Filled 2019-08-12 (×4): qty 1

## 2019-08-12 MED ORDER — ONDANSETRON HCL 4 MG/2ML IJ SOLN: 4.0000 mg | Freq: Four times a day (QID) | INTRAMUSCULAR | Status: DC | PRN

## 2019-08-12 MED ORDER — SODIUM CHLORIDE 0.9 % IV SOLN: 250.0000 mL | INTRAVENOUS | Status: DC | PRN

## 2019-08-12 MED ORDER — SODIUM CHLORIDE 0.9% FLUSH
3.0000 mL | Freq: Two times a day (BID) | INTRAVENOUS | Status: DC
  Administered 2019-08-13 – 2019-08-16 (×7): 3 mL via INTRAVENOUS

## 2019-08-12 MED ORDER — ONDANSETRON HCL 4 MG PO TABS: 4.0000 mg | ORAL_TABLET | Freq: Four times a day (QID) | ORAL | Status: DC | PRN

## 2019-08-12 MED ORDER — ENOXAPARIN SODIUM 40 MG/0.4ML ~~LOC~~ SOLN
40.0000 mg | SUBCUTANEOUS | Status: DC
  Administered 2019-08-12 – 2019-08-15 (×4): 40 mg via SUBCUTANEOUS
  Filled 2019-08-12 (×4): qty 0.4

## 2019-08-12 MED ORDER — SODIUM CHLORIDE 0.9 % IV SOLN
200.0000 mg | Freq: Once | INTRAVENOUS | Status: AC
  Administered 2019-08-12: 200 mg via INTRAVENOUS
  Filled 2019-08-12: qty 40

## 2019-08-12 MED ORDER — ACETAMINOPHEN 325 MG PO TABS
650.0000 mg | ORAL_TABLET | Freq: Four times a day (QID) | ORAL | Status: DC | PRN
  Administered 2019-08-12 – 2019-08-15 (×3): 650 mg via ORAL
  Filled 2019-08-12 (×3): qty 2

## 2019-08-12 MED ORDER — VITAMIN C 500 MG PO TABS
500.0000 mg | ORAL_TABLET | Freq: Every day | ORAL | Status: DC
  Administered 2019-08-13 – 2019-08-16 (×4): 500 mg via ORAL
  Filled 2019-08-12 (×4): qty 1

## 2019-08-12 NOTE — H&P (Signed)
History and Physical    Tina Perkins WRU:[id number] DOB: 08-13-1941 DOA: 08/12/2019  PCP: Lillard Anes, MD  Patient coming from: home  Chief Complaint:  sob  HPI: Tina Perkins is a 78 y.o. female with medical history significant of arthritis comes in with several days sob, fever, cough worsening sob.  No abd pain or chest pain.  No n/v/d.  Has had covid exposure.  Went to [facility] ed found to be hypoxic at 87% on RA and covid positive.  Pt found to have covid pna on cxr.  Review of Systems: As per HPI otherwise 10 point review of systems negative.   No past medical history on file.  arthritis    has no history on file for tobacco, alcohol, and drug. smokes, no etoh  Not on File  No family history on file. no prematureCAD  Prior to Admission medications   Not on File  Arthritis medicine  Physical Exam: Vitals:   08/12/19 2230  BP: (!) 143/79  Pulse: 69  Resp: 18  Temp: 99.1 F (37.3 C)  TempSrc: Oral  SpO2: 96%  Weight: 78.6 kg  Height: 5\' 2"  (1.575 m)      Constitutional: NAD, calm, comfortable Vitals:   08/12/19 2230  BP: (!) 143/79  Pulse: 69  Resp: 18  Temp: 99.1 F (37.3 C)  TempSrc: Oral  SpO2: 96%  Weight: 78.6 kg  Height: 5\' 2"  (1.575 m)   Eyes: PERRL, lids and conjunctivae normal ENMT: Mucous membranes are moist. Posterior pharynx clear of any exudate or lesions.Normal dentition.  Neck: normal, supple, no masses, no thyromegaly Respiratory: clear to auscultation bilaterally, no wheezing, no crackles. Normal respiratory effort. No accessory muscle use.  Cardiovascular: Regular rate and rhythm, no murmurs / rubs / gallops. No extremity edema. 2+ pedal pulses. No carotid bruits.  Abdomen: no tenderness, no masses palpated. No hepatosplenomegaly. Bowel sounds positive.  Musculoskeletal: no clubbing / cyanosis. No joint deformity upper and lower extremities. Good ROM, no contractures. Normal muscle tone.  Skin: no rashes, lesions, ulcers.  No induration Neurologic: CN 2-12 grossly intact. Sensation intact, DTR normal. Strength 5/5 in all 4.  Psychiatric: Normal judgment and insight. Alert and oriented x 3. Normal mood.    Labs on Admission: I have personally reviewed following labs and imaging studies  CBC: No results for input(s): WBC, NEUTROABS, HGB, HCT, MCV, PLT in the last 168 hours. Basic Metabolic Panel: No results for input(s): NA, K, CL, CO2, GLUCOSE, BUN, CREATININE, CALCIUM, MG, PHOS in the last 168 hours. GFR: CrCl cannot be calculated (No successful lab value found.). Liver Function Tests: No results for input(s): AST, ALT, ALKPHOS, BILITOT, PROT, ALBUMIN in the last 168 hours. No results for input(s): LIPASE, AMYLASE in the last 168 hours. No results for input(s): AMMONIA in the last 168 hours. Coagulation Profile: No results for input(s): INR, PROTIME in the last 168 hours. Cardiac Enzymes: No results for input(s): CKTOTAL, CKMB, CKMBINDEX, TROPONINI in the last 168 hours. BNP (last 3 results) No results for input(s): PROBNP in the last 8760 hours. HbA1C: No results for input(s): HGBA1C in the last 72 hours. CBG: No results for input(s): GLUCAP in the last 168 hours. Lipid Profile: No results for input(s): CHOL, HDL, LDLCALC, TRIG, CHOLHDL, LDLDIRECT in the last 72 hours. Thyroid Function Tests: No results for input(s): TSH, T4TOTAL, FREET4, T3FREE, THYROIDAB in the last 72 hours. Anemia Panel: No results for input(s): VITAMINB12, FOLATE, FERRITIN, TIBC, IRON, RETICCTPCT in the last 72 hours. Urine analysis:  No results found for: COLORURINE, APPEARANCEUR, LABSPEC, PHURINE, GLUCOSEU, HGBUR, BILIRUBINUR, KETONESUR, PROTEINUR, UROBILINOGEN, NITRITE, LEUKOCYTESUR Sepsis Labs: !!!!!!!!!!!!!!!!!!!!!!!!!!!!!!!!!!!!!!!!!!!! @LABRCNTIP (procalcitonin:4,lacticidven:4) )No results found for this or any previous visit (from the past 240 hour(s)).   Radiological Exams on Admission: No results found.   Assessment/Plan 78 yo healthy female with acute hypoxic resp failure secondary to bilateral covid pna  Principal Problem:   Pneumonia due to COVID-19 virus- pt agreeable to iv decadron and remdisivir for covid treatment.  Also place on lovenox, vit c, zinc.  Ck cmp, cbc, bnp, procal, dimer, crp levels.  Daily labs also ordered.  Wean oxygen as tolerates.  Currently no respiratory distress, on 2 liters with nml sats.  Active Problems:   Acute respiratory disease due to COVID-19 virus- as above    DVT prophylaxis:  lovenox Code Status:  full Family Communication:  none Disposition Plan:  days Consults called:  none Admission status:  admission   Shatera Rennert A MD [facility]  If 7PM-7AM, please contact night-coverage [email or web address] Password [email or web address]  08/12/2019, 10:50 PM

## 2019-08-12 NOTE — Progress Notes (Signed)
Pharmacy Note - Remdesivir Dosing  O:  ALT: 40 CXR: neg Requiring supplemental O2:  2L per [place]   A/P:  Patient meets criteria for remdesivir.  Begin remdesivir 200 mg IV x 1, followed by 100 mg IV daily x 4 days  Monitor ALT, clinical progress  Despina Pole, Pharm. D. Clinical Pharmacist 08/12/2019 10:55 PM

## 2019-08-13 ENCOUNTER — Encounter (HOSPITAL_COMMUNITY): Payer: Self-pay

## 2019-08-13 DIAGNOSIS — J069 Acute upper respiratory infection, unspecified: Secondary | ICD-10-CM

## 2019-08-13 LAB — COMPREHENSIVE METABOLIC PANEL
ALT: 32 U/L (ref 0–44)
ALT: 31 U/L (ref 0–44)
AST: 30 U/L (ref 15–41)
AST: 30 U/L (ref 15–41)
Albumin: 3.4 g/dL — ABNORMAL LOW (ref 3.5–5.0)
Albumin: 3.7 g/dL (ref 3.5–5.0)
Alkaline Phosphatase: 49 U/L (ref 38–126)
Alkaline Phosphatase: 52 U/L (ref 38–126)
Anion gap: 6 (ref 5–15)
Anion gap: 9 (ref 5–15)
BUN: 17 mg/dL (ref 8–23)
BUN: 20 mg/dL (ref 8–23)
CO2: 23 mmol/L (ref 22–32)
CO2: 21 mmol/L — ABNORMAL LOW (ref 22–32)
Calcium: 8.3 mg/dL — ABNORMAL LOW (ref 8.9–10.3)
Calcium: 8.5 mg/dL — ABNORMAL LOW (ref 8.9–10.3)
Chloride: 109 mmol/L (ref 98–111)
Chloride: 108 mmol/L (ref 98–111)
Creatinine, Ser: 0.72 mg/dL (ref 0.44–1.00)
Creatinine, Ser: 0.66 mg/dL (ref 0.44–1.00)
GFR calc Af Amer: >60 mL/min (ref >60)
GFR calc Af Amer: >60 mL/min (ref >60)
GFR calc non Af Amer: >60 mL/min (ref >60)
GFR calc non Af Amer: >60 mL/min (ref >60)
Glucose, Bld: 121 mg/dL — ABNORMAL HIGH (ref 70–99)
Glucose, Bld: 228 mg/dL — ABNORMAL HIGH (ref 70–99)
Potassium: 3.4 mmol/L — ABNORMAL LOW (ref 3.5–5.1)
Potassium: 3.9 mmol/L (ref 3.5–5.1)
Sodium: 138 mmol/L (ref 135–145)
Sodium: 138 mmol/L (ref 135–145)
Total Bilirubin: 0.9 mg/dL (ref 0.3–1.2)
Total Bilirubin: 0.6 mg/dL (ref 0.3–1.2)
Total Protein: 6.3 g/dL — ABNORMAL LOW (ref 6.5–8.1)
Total Protein: 6.7 g/dL (ref 6.5–8.1)

## 2019-08-13 LAB — CBC WITH DIFFERENTIAL/PLATELET
Abs Immature Granulocytes: 0 10*3/uL (ref 0.00–0.07)
Basophils Absolute: 0 10*3/uL (ref 0.0–0.1)
Basophils Relative: 0 %
Eosinophils Absolute: 0 10*3/uL (ref 0.0–0.5)
Eosinophils Relative: 0 %
HCT: 44.1 % (ref 36.0–46.0)
Hemoglobin: 14.3 g/dL (ref 12.0–15.0)
Immature Granulocytes: 0 %
Lymphocytes Relative: 25 %
Lymphs Abs: 0.8 10*3/uL (ref 0.7–4.0)
MCH: 29.1 pg (ref 26.0–34.0)
MCHC: 32.4 g/dL (ref 30.0–36.0)
MCV: 89.8 fL (ref 80.0–100.0)
Monocytes Absolute: 0.1 10*3/uL (ref 0.1–1.0)
Monocytes Relative: 3 %
Neutro Abs: 2.2 10*3/uL (ref 1.7–7.7)
Neutrophils Relative %: 72 %
Platelets: 159 10*3/uL (ref 150–400)
RBC: 4.91 MIL/uL (ref 3.87–5.11)
RDW: 11.9 % (ref 11.5–15.5)
WBC: 3.1 10*3/uL — ABNORMAL LOW (ref 4.0–10.5)
nRBC: 0 % (ref 0.0–0.2)

## 2019-08-13 LAB — GLUCOSE, CAPILLARY
Glucose-Capillary: 281 mg/dL — ABNORMAL HIGH (ref 70–99)
Glucose-Capillary: 293 mg/dL — ABNORMAL HIGH (ref 70–99)
Glucose-Capillary: 211 mg/dL — ABNORMAL HIGH (ref 70–99)
Glucose-Capillary: 205 mg/dL — ABNORMAL HIGH (ref 70–99)

## 2019-08-13 LAB — HEMOGLOBIN A1C
Hgb A1c MFr Bld: 5.9 % — ABNORMAL HIGH (ref 4.8–5.6)
Mean Plasma Glucose: 122.63 mg/dL

## 2019-08-13 LAB — PROCALCITONIN: Procalcitonin: <0.1 ng/mL

## 2019-08-13 LAB — D-DIMER, QUANTITATIVE
D-Dimer, Quant: 0.74 ug/mL-FEU — ABNORMAL HIGH (ref 0.00–0.50)
D-Dimer, Quant: 0.57 ug/mL-FEU — ABNORMAL HIGH (ref 0.00–0.50)

## 2019-08-13 LAB — C-REACTIVE PROTEIN
CRP: 1.7 mg/dL — ABNORMAL HIGH (ref <1.0)
CRP: 2.3 mg/dL — ABNORMAL HIGH (ref <1.0)

## 2019-08-13 LAB — ABO/RH: ABO/RH(D): O POS

## 2019-08-13 LAB — BRAIN NATRIURETIC PEPTIDE: B Natriuretic Peptide: 12.5 pg/mL (ref 0.0–100.0)

## 2019-08-13 MED ORDER — INSULIN DETEMIR 100 UNIT/ML ~~LOC~~ SOLN
10.0000 [IU] | Freq: Two times a day (BID) | SUBCUTANEOUS | Status: DC
  Administered 2019-08-13 (×2): 10 [IU] via SUBCUTANEOUS
  Filled 2019-08-13 (×3): qty 0.1

## 2019-08-13 MED ORDER — INSULIN ASPART 100 UNIT/ML ~~LOC~~ SOLN
0.0000 [IU] | Freq: Every day | SUBCUTANEOUS | Status: DC
  Administered 2019-08-13: 2 [IU] via SUBCUTANEOUS
  Administered 2019-08-14: 21:00:00 3 [IU] via SUBCUTANEOUS
  Administered 2019-08-15: 21:00:00 2 [IU] via SUBCUTANEOUS

## 2019-08-13 MED ORDER — INSULIN DETEMIR 100 UNIT/ML ~~LOC~~ SOLN: 20.0000 [IU] | Freq: Two times a day (BID) | SUBCUTANEOUS | Status: DC

## 2019-08-13 MED ORDER — ORAL CARE MOUTH RINSE
15.0000 mL | Freq: Two times a day (BID) | OROMUCOSAL | Status: DC
  Administered 2019-08-13 – 2019-08-16 (×7): 15 mL via OROMUCOSAL

## 2019-08-13 MED ORDER — INSULIN ASPART 100 UNIT/ML ~~LOC~~ SOLN
4.0000 [IU] | Freq: Three times a day (TID) | SUBCUTANEOUS | Status: DC
  Administered 2019-08-13 – 2019-08-15 (×7): 4 [IU] via SUBCUTANEOUS

## 2019-08-13 MED ORDER — INSULIN ASPART 100 UNIT/ML ~~LOC~~ SOLN
0.0000 [IU] | Freq: Three times a day (TID) | SUBCUTANEOUS | Status: DC
  Administered 2019-08-13: 13:00:00 8 [IU] via SUBCUTANEOUS
  Administered 2019-08-13 – 2019-08-14 (×2): 5 [IU] via SUBCUTANEOUS
  Administered 2019-08-14 (×2): 8 [IU] via SUBCUTANEOUS
  Administered 2019-08-15 (×2): 5 [IU] via SUBCUTANEOUS
  Administered 2019-08-15: 08:00:00 3 [IU] via SUBCUTANEOUS
  Administered 2019-08-16: 08:00:00 5 [IU] via SUBCUTANEOUS

## 2019-08-13 NOTE — Plan of Care (Signed)
  Problem: Education: Goal: Knowledge of General Education information will improve Description: Including pain rating scale, medication(s)/side effects and non-pharmacologic comfort measures Outcome: Progressing   Problem: Clinical Measurements: Goal: Ability to maintain clinical measurements within normal limits will improve Outcome: Progressing Goal: Will remain free from infection Outcome: Progressing Goal: Cardiovascular complication will be avoided Outcome: Progressing   Problem: Nutrition: Goal: Adequate nutrition will be maintained Outcome: Progressing   Problem: Coping: Goal: Level of anxiety will decrease Outcome: Progressing   Problem: Safety: Goal: Ability to remain free from injury will improve Outcome: Progressing

## 2019-08-13 NOTE — Progress Notes (Signed)
[facility] PROGRESS NOTE    Progress Note  Tina Perkins  SWF:[id number] DOB: 26-Aug-1941 DOA: 08/12/2019 PCP: Tina Miyamoto, MD     Brief Narrative:   Tina Perkins is an 78 y.o. female past medical history of arthritis, CAD involving the native arteries on a baby aspirin with a normal ejection fraction comes in for several days of fever cough and shortness of breath.  In the ED at [facility] was found to be hypoxic at 87% on room air, COVID-19 PCR was positive chest x-ray was shown to have bilateral infiltrates  Assessment/Plan:   Acute respiratory disease due to COVID-19 virus due to Pneumonia due to COVID-19 virus: She is currently satting greater than 96% on 2 L of oxygen. Inflammatory markers are elevated, will continue to monitor inflammatory markers closely. Continue IV remdesivir and steroids. Try to keep the patient peripherally 16 hours a day. Continue vitamin C and zinc.  Hyperglycemia: I will ask pharmacy to do her med rec, her blood glucose is going to become erratic likely due to steroids. We will start her on long-acting insulin and sliding scale. Check an A1c.  Hypokalemia: Repleted orally now improved.   DVT prophylaxis: lovenox Family Communication:none Disposition Plan/Barrier to D/C: When she complete her remdesivir treatment. Code Status:     Code Status Orders  (From admission, onward)         Start     Ordered   08/12/19 2235  Full code  Continuous     08/12/19 2236        Code Status History    This patient has a current code status but no historical code status.   Advance Care Planning Activity        IV Access:    Peripheral IV   Procedures and diagnostic studies:   No results found.   Medical Consultants:    None.  Anti-Infectives:   IV remdesivir.  Subjective:    Tina Perkins she relates she continues to be short of breath unchanged compared to yesterday.  Objective:    Vitals:   08/12/19  2230 08/13/19 0405  BP: (!) 143/79 130/74  Pulse: 69 68  Resp: 18 18  Temp: 99.1 F (37.3 C) 98 F (36.7 C)  TempSrc: Oral Oral  SpO2: 96% 93%  Weight: 78.6 kg   Height: 5\' 2"  (1.575 m)    SpO2: 93 % O2 Flow Rate (L/min): 2 L/min   Intake/Output Summary (Last 24 hours) at 08/13/2019 0746 Last data filed at 08/13/2019 0600 Gross per 24 hour  Intake 180 ml  Output -  Net 180 ml   Filed Weights   08/12/19 2230  Weight: 78.6 kg    Exam: General exam: In no acute distress. Respiratory system: Good air movement and diffuse crackles. Cardiovascular system: S1 & S2 heard, RRR. No JVD. Gastrointestinal system: Abdomen is nondistended, soft and nontender.  Central nervous system: Alert and oriented. No focal neurological deficits. Extremities: No pedal edema. Skin: No rashes, lesions or ulcers Psychiatry: Judgement and insight appear normal. Mood & affect appropriate.   Data Reviewed:    Labs: Basic Metabolic Panel: Recent Labs  Lab 08/12/19 2300 08/13/19 0600  NA 138 138  K 3.4* 3.9  CL 109 108  CO2 23 21*  GLUCOSE 121* 228*  BUN 17 20  CREATININE 0.72 0.66  CALCIUM 8.3* 8.5*   GFR Estimated Creatinine Clearance: 56.3 mL/min (by C-G formula based on SCr of 0.66 mg/dL). Liver Function Tests: Recent Labs  Lab  08/12/19 2300 08/13/19 0600  AST 30 30  ALT 32 31  ALKPHOS 49 52  BILITOT 0.9 0.6  PROT 6.3* 6.7  ALBUMIN 3.4* 3.7   No results for input(s): LIPASE, AMYLASE in the last 168 hours. No results for input(s): AMMONIA in the last 168 hours. Coagulation profile No results for input(s): INR, PROTIME in the last 168 hours. COVID-19 Labs  Recent Labs    08/12/19 2300  DDIMER 0.74*  CRP 1.7*    No results found for: SARSCOV2NAA  CBC: Recent Labs  Lab 08/12/19 2300 08/13/19 0600  WBC 3.8* 3.1*  NEUTROABS 1.6* 2.2  HGB 13.8 14.3  HCT 42.0 44.1  MCV 89.6 89.8  PLT 177 159   Cardiac Enzymes: No results for input(s): CKTOTAL, CKMB,  CKMBINDEX, TROPONINI in the last 168 hours. BNP (last 3 results) No results for input(s): PROBNP in the last 8760 hours. CBG: No results for input(s): GLUCAP in the last 168 hours. D-Dimer: Recent Labs    08/12/19 2300  DDIMER 0.74*   Hgb A1c: No results for input(s): HGBA1C in the last 72 hours. Lipid Profile: No results for input(s): CHOL, HDL, LDLCALC, TRIG, CHOLHDL, LDLDIRECT in the last 72 hours. Thyroid function studies: No results for input(s): TSH, T4TOTAL, T3FREE, THYROIDAB in the last 72 hours.  Invalid input(s): FREET3 Anemia work up: No results for input(s): VITAMINB12, FOLATE, FERRITIN, TIBC, IRON, RETICCTPCT in the last 72 hours. Sepsis Labs: Recent Labs  Lab 08/12/19 2300 08/13/19 0600  PROCALCITON <0.10  --   WBC 3.8* 3.1*   Microbiology No results found for this or any previous visit (from the past 240 hour(s)).   Medications:   . dexamethasone (DECADRON) injection  6 mg Intravenous Q24H  . enoxaparin (LOVENOX) injection  40 mg Subcutaneous Q24H  . mouth rinse  15 mL Mouth Rinse BID  . sodium chloride flush  3 mL Intravenous Q12H  . vitamin C  500 mg Oral Daily  . zinc sulfate  220 mg Oral Daily   Continuous Infusions: . sodium chloride    . remdesivir 100 mg in NS 250 mL        LOS: 1 day   Tina Perkins  [facility]  08/13/2019, 7:46 AM

## 2019-08-13 NOTE — Progress Notes (Signed)
Patient is using her flutter valve and incentive spirometer alternately. She is doing well and her cough is improving. Will continue to monitor patient.

## 2019-08-14 LAB — CBC WITH DIFFERENTIAL/PLATELET
Abs Immature Granulocytes: 0 10*3/uL (ref 0.00–0.07)
Basophils Absolute: 0 10*3/uL (ref 0.0–0.1)
Basophils Relative: 0 %
Eosinophils Absolute: 0 10*3/uL (ref 0.0–0.5)
Eosinophils Relative: 0 %
HCT: 42.5 % (ref 36.0–46.0)
Hemoglobin: 14 g/dL (ref 12.0–15.0)
Immature Granulocytes: 0 %
Lymphocytes Relative: 17 %
Lymphs Abs: 0.8 10*3/uL (ref 0.7–4.0)
MCH: 29.3 pg (ref 26.0–34.0)
MCHC: 32.9 g/dL (ref 30.0–36.0)
MCV: 88.9 fL (ref 80.0–100.0)
Monocytes Absolute: 0.2 10*3/uL (ref 0.1–1.0)
Monocytes Relative: 5 %
Neutro Abs: 3.8 10*3/uL (ref 1.7–7.7)
Neutrophils Relative %: 78 %
Platelets: 190 10*3/uL (ref 150–400)
RBC: 4.78 MIL/uL (ref 3.87–5.11)
RDW: 11.6 % (ref 11.5–15.5)
WBC: 4.8 10*3/uL (ref 4.0–10.5)
nRBC: 0 % (ref 0.0–0.2)

## 2019-08-14 LAB — COMPREHENSIVE METABOLIC PANEL
ALT: 28 U/L (ref 0–44)
AST: 24 U/L (ref 15–41)
Albumin: 3.5 g/dL (ref 3.5–5.0)
Alkaline Phosphatase: 64 U/L (ref 38–126)
Anion gap: 12 (ref 5–15)
BUN: 22 mg/dL (ref 8–23)
CO2: 20 mmol/L — ABNORMAL LOW (ref 22–32)
Calcium: 9.2 mg/dL (ref 8.9–10.3)
Chloride: 109 mmol/L (ref 98–111)
Creatinine, Ser: 0.7 mg/dL (ref 0.44–1.00)
GFR calc Af Amer: >60 mL/min (ref >60)
GFR calc non Af Amer: >60 mL/min (ref >60)
Glucose, Bld: 229 mg/dL — ABNORMAL HIGH (ref 70–99)
Potassium: 4 mmol/L (ref 3.5–5.1)
Sodium: 141 mmol/L (ref 135–145)
Total Bilirubin: 0.9 mg/dL (ref 0.3–1.2)
Total Protein: 6.3 g/dL — ABNORMAL LOW (ref 6.5–8.1)

## 2019-08-14 LAB — GLUCOSE, CAPILLARY
Glucose-Capillary: 257 mg/dL — ABNORMAL HIGH (ref 70–99)
Glucose-Capillary: 250 mg/dL — ABNORMAL HIGH (ref 70–99)
Glucose-Capillary: 296 mg/dL — ABNORMAL HIGH (ref 70–99)
Glucose-Capillary: 283 mg/dL — ABNORMAL HIGH (ref 70–99)

## 2019-08-14 LAB — D-DIMER, QUANTITATIVE: D-Dimer, Quant: 0.5 ug/mL-FEU (ref 0.00–0.50)

## 2019-08-14 LAB — C-REACTIVE PROTEIN: CRP: 1.9 mg/dL — ABNORMAL HIGH (ref <1.0)

## 2019-08-14 MED ORDER — INSULIN DETEMIR 100 UNIT/ML ~~LOC~~ SOLN
20.0000 [IU] | Freq: Two times a day (BID) | SUBCUTANEOUS | Status: DC
  Administered 2019-08-14 – 2019-08-16 (×5): 20 [IU] via SUBCUTANEOUS
  Filled 2019-08-14 (×5): qty 0.2

## 2019-08-14 MED ORDER — DEXAMETHASONE 6 MG PO TABS
6.0000 mg | ORAL_TABLET | ORAL | Status: DC
  Administered 2019-08-14 – 2019-08-15 (×2): 6 mg via ORAL
  Filled 2019-08-14 (×2): qty 1

## 2019-08-14 NOTE — Progress Notes (Signed)
[facility] PROGRESS NOTE    Progress Note  Tina Perkins  LFY:[id number] DOB: [birth year] DOA: 08/12/2019 PCP: Lillard Anes, MD     Brief Narrative:   Tina Perkins is an 78 y.o. female past medical history of arthritis, CAD involving the native arteries on a baby aspirin with a normal ejection fraction comes in for several days of fever cough and shortness of breath.  In the ED at [facility] was found to be hypoxic at 87% on room air, COVID-19 PCR was positive chest x-ray was shown to have bilateral infiltrates  Assessment/Plan:   Acute respiratory disease due to COVID-19 virus due to Pneumonia due to COVID-19 virus: She is currently satting greater than 92% on 2 L. Inflammatory markers were mildly elevated but they are improving. Continue IV remdesivir, steroids, vitamin C and zinc. Continue to keep the patient prone for at least 16 hours a day.  Hyperglycemia: A1c of 5.9, currently on long-acting insulin plus sliding scale blood glucose remains elevated. We will titrate up long-acting insulin.  Hypokalemia: Repleted orally now improved.   DVT prophylaxis: lovenox Family Communication:none Disposition Plan/Barrier to D/C: When she complete her remdesivir treatment. Code Status:     Code Status Orders  (From admission, onward)         Start     Ordered   08/12/19 2235  Full code  Continuous     08/12/19 2236        Code Status History    This patient has a current code status but no historical code status.   Advance Care Planning Activity        IV Access:    Peripheral IV   Procedures and diagnostic studies:   No results found.   Medical Consultants:    None.  Anti-Infectives:   IV remdesivir.  Subjective:    Tina Perkins she relates her breathing is unchanged compared to yesterday.  She continues to have persistent cough.  Objective:    Vitals:   08/13/19 1625 08/13/19 2200 08/14/19 0426 08/14/19 0738  BP: 137/72  (!) 150/89 139/86 137/76  Pulse:  64 61 61  Resp: 20 (!) 22 20 18   Temp: 98.6 F (37 C) 97.8 F (36.6 C) 98.1 F (36.7 C) 97.6 F (36.4 C)  TempSrc: Oral Axillary Oral Oral  SpO2: 98% 93% 93% 92%  Weight:      Height:       SpO2: 92 % O2 Flow Rate (L/min): 2 L/min   Intake/Output Summary (Last 24 hours) at 08/14/2019 0758 Last data filed at 08/13/2019 1824 Gross per 24 hour  Intake 1980 ml  Output -  Net 1980 ml   Filed Weights   08/12/19 2230  Weight: 78.6 kg    Exam: General exam: In no acute distress. Respiratory system: Good air movement and diffuse crackles bilaterally. Cardiovascular system: S1 & S2 heard, RRR. No JVD. Gastrointestinal system: Abdomen is nondistended, soft and nontender.  Central nervous system: Alert and oriented. No focal neurological deficits. Extremities: No pedal edema. Skin: No rashes, lesions or ulcers Psychiatry: Judgement and insight appear normal. Mood & affect appropriate.    Data Reviewed:    Labs: Basic Metabolic Panel: Recent Labs  Lab 08/12/19 2300 08/13/19 0600 08/14/19 0137  NA 138 138 141  K 3.4* 3.9 4.0  CL 109 108 109  CO2 23 21* 20*  GLUCOSE 121* 228* 229*  BUN 17 20 22   CREATININE 0.72 0.66 0.70  CALCIUM 8.3* 8.5* 9.2   GFR  Estimated Creatinine Clearance: 56.3 mL/min (by C-G formula based on SCr of 0.7 mg/dL). Liver Function Tests: Recent Labs  Lab 08/12/19 2300 08/13/19 0600 08/14/19 0137  AST 30 30 24   ALT 32 31 28  ALKPHOS 49 52 64  BILITOT 0.9 0.6 0.9  PROT 6.3* 6.7 6.3*  ALBUMIN 3.4* 3.7 3.5   No results for input(s): LIPASE, AMYLASE in the last 168 hours. No results for input(s): AMMONIA in the last 168 hours. Coagulation profile No results for input(s): INR, PROTIME in the last 168 hours. COVID-19 Labs  Recent Labs    08/12/19 2300 08/13/19 0600 08/14/19 0137  DDIMER 0.74* 0.57* 0.50  CRP 1.7* 2.3* 1.9*    No results found for: SARSCOV2NAA  CBC: Recent Labs  Lab 08/12/19  2300 08/13/19 0600 08/14/19 0137  WBC 3.8* 3.1* 4.8  NEUTROABS 1.6* 2.2 3.8  HGB 13.8 14.3 14.0  HCT 42.0 44.1 42.5  MCV 89.6 89.8 88.9  PLT 177 159 190   Cardiac Enzymes: No results for input(s): CKTOTAL, CKMB, CKMBINDEX, TROPONINI in the last 168 hours. BNP (last 3 results) No results for input(s): PROBNP in the last 8760 hours. CBG: Recent Labs  Lab 08/13/19 0935 08/13/19 1134 08/13/19 1621 08/13/19 2036 08/14/19 0736  GLUCAP 281* 293* 211* 205* 257*   D-Dimer: Recent Labs    08/13/19 0600 08/14/19 0137  DDIMER 0.57* 0.50   Hgb A1c: Recent Labs    08/13/19 0500  HGBA1C 5.9*   Lipid Profile: No results for input(s): CHOL, HDL, LDLCALC, TRIG, CHOLHDL, LDLDIRECT in the last 72 hours. Thyroid function studies: No results for input(s): TSH, T4TOTAL, T3FREE, THYROIDAB in the last 72 hours.  Invalid input(s): FREET3 Anemia work up: No results for input(s): VITAMINB12, FOLATE, FERRITIN, TIBC, IRON, RETICCTPCT in the last 72 hours. Sepsis Labs: Recent Labs  Lab 08/12/19 2300 08/13/19 0600 08/14/19 0137  PROCALCITON <0.10  --   --   WBC 3.8* 3.1* 4.8   Microbiology No results found for this or any previous visit (from the past 240 hour(s)).   Medications:   . dexamethasone (DECADRON) injection  6 mg Intravenous Q24H  . enoxaparin (LOVENOX) injection  40 mg Subcutaneous Q24H  . insulin aspart  0-15 Units Subcutaneous TID WC  . insulin aspart  0-5 Units Subcutaneous QHS  . insulin aspart  4 Units Subcutaneous TID WC  . insulin detemir  10 Units Subcutaneous BID  . mouth rinse  15 mL Mouth Rinse BID  . sodium chloride flush  3 mL Intravenous Q12H  . vitamin C  500 mg Oral Daily  . zinc sulfate  220 mg Oral Daily   Continuous Infusions: . sodium chloride    . remdesivir 100 mg in NS 250 mL Stopped (08/13/19 2200)      LOS: 2 days   13/09/20  [facility]  08/14/2019, 7:58 AM

## 2019-08-15 ENCOUNTER — Encounter (HOSPITAL_COMMUNITY): Payer: Self-pay

## 2019-08-15 LAB — C-REACTIVE PROTEIN: CRP: 0.9 mg/dL (ref <1.0)

## 2019-08-15 LAB — CBC WITH DIFFERENTIAL/PLATELET
Abs Immature Granulocytes: 0.04 10*3/uL (ref 0.00–0.07)
Basophils Absolute: 0 10*3/uL (ref 0.0–0.1)
Basophils Relative: 0 %
Eosinophils Absolute: 0.2 10*3/uL (ref 0.0–0.5)
Eosinophils Relative: 3 %
HCT: 38.8 % (ref 36.0–46.0)
Hemoglobin: 12.6 g/dL (ref 12.0–15.0)
Immature Granulocytes: 1 %
Lymphocytes Relative: 20 %
Lymphs Abs: 1.7 10*3/uL (ref 0.7–4.0)
MCH: 29.2 pg (ref 26.0–34.0)
MCHC: 32.5 g/dL (ref 30.0–36.0)
MCV: 89.8 fL (ref 80.0–100.0)
Monocytes Absolute: 0.7 10*3/uL (ref 0.1–1.0)
Monocytes Relative: 9 %
Neutro Abs: 5.7 10*3/uL (ref 1.7–7.7)
Neutrophils Relative %: 67 %
Platelets: 179 10*3/uL (ref 150–400)
RBC: 4.32 MIL/uL (ref 3.87–5.11)
RDW: 11.7 % (ref 11.5–15.5)
WBC: 8.4 10*3/uL (ref 4.0–10.5)
nRBC: 0 % (ref 0.0–0.2)

## 2019-08-15 LAB — COMPREHENSIVE METABOLIC PANEL
ALT: 24 U/L (ref 0–44)
AST: 22 U/L (ref 15–41)
Albumin: 2.8 g/dL — ABNORMAL LOW (ref 3.5–5.0)
Alkaline Phosphatase: 64 U/L (ref 38–126)
Anion gap: 10 (ref 5–15)
BUN: 25 mg/dL — ABNORMAL HIGH (ref 8–23)
CO2: 22 mmol/L (ref 22–32)
Calcium: 8.8 mg/dL — ABNORMAL LOW (ref 8.9–10.3)
Chloride: 107 mmol/L (ref 98–111)
Creatinine, Ser: 0.64 mg/dL (ref 0.44–1.00)
GFR calc Af Amer: >60 mL/min (ref >60)
GFR calc non Af Amer: >60 mL/min (ref >60)
Glucose, Bld: 220 mg/dL — ABNORMAL HIGH (ref 70–99)
Potassium: 3.5 mmol/L (ref 3.5–5.1)
Sodium: 139 mmol/L (ref 135–145)
Total Bilirubin: 0.3 mg/dL (ref 0.3–1.2)
Total Protein: 5.5 g/dL — ABNORMAL LOW (ref 6.5–8.1)

## 2019-08-15 LAB — GLUCOSE, CAPILLARY
Glucose-Capillary: 195 mg/dL — ABNORMAL HIGH (ref 70–99)
Glucose-Capillary: 239 mg/dL — ABNORMAL HIGH (ref 70–99)
Glucose-Capillary: 202 mg/dL — ABNORMAL HIGH (ref 70–99)
Glucose-Capillary: 242 mg/dL — ABNORMAL HIGH (ref 70–99)

## 2019-08-15 LAB — D-DIMER, QUANTITATIVE: D-Dimer, Quant: 0.29 ug/mL-FEU (ref 0.00–0.50)

## 2019-08-15 MED ORDER — INSULIN ASPART 100 UNIT/ML ~~LOC~~ SOLN
8.0000 [IU] | Freq: Three times a day (TID) | SUBCUTANEOUS | Status: DC
  Administered 2019-08-15 – 2019-08-16 (×2): 8 [IU] via SUBCUTANEOUS

## 2019-08-15 NOTE — Progress Notes (Signed)
Inpatient Diabetes Program Recommendations  AACE/ADA: New Consensus Statement on Inpatient Glycemic Control  Target Ranges:  Prepandial:   less than 140 mg/dL      Peak postprandial:   less than 180 mg/dL (1-2 hours)      Critically ill patients:  140 - 180 mg/dL   Results for Tina Perkins, Tina Perkins (MRN [id number]) as of 08/15/2019 13:59  Ref. Range 08/14/2019 07:36 08/14/2019 11:07 08/14/2019 16:43 08/14/2019 21:05 08/15/2019 07:12 08/15/2019 11:16  Glucose-Capillary Latest Ref Range: 70 - 99 mg/dL 257 (H) 250 (H) 296 (H) 283 (H) 195 (H) 239 (H)   Review of Glycemic Control  Diabetes history: No Outpatient Diabetes medications: NA Current orders for Inpatient glycemic control: Levemir 20 units BID, Novolog 0-15 units TID with meals, Novolog 0-5 units QHS, Novolog 4 units TID with meals; Decadron 6 mg Q24H  Inpatient Diabetes Program Recommendations:   Insulin-Meal Coverage: If steroids are continued, please consider increasing meal coverage to Novolog 8 units TID with meals if patient eats at least 50% of meals.  Thanks, Barnie Alderman, RN, MSN, CDE Diabetes Coordinator Inpatient Diabetes Program [phone] (Team Pager from 8am to 5pm)

## 2019-08-15 NOTE — Progress Notes (Signed)
Attempted to call daughter. Unable to get through on phone. Pt reports daughter has trouble with her phone lines a lot. Will attempt later.

## 2019-08-15 NOTE — Progress Notes (Signed)
PROGRESS NOTE                                                                                                                                                                                                             Patient Demographics:    Tina Perkins, is a 78 y.o. female, DOB - 16-Oct-1940, UJW:[id number]  Outpatient Primary MD for the patient is [name], Tina Edward, MD   Admit date - 08/12/2019   LOS - 3  No chief complaint on file.      Brief Narrative: Patient is a 78 y.o. female with PMHx of CAD-treated with several days history of cough, fever, shortness of breath-found to have COVID-19 pneumonia and acute hypoxic respiratory failure.  See below for further details.   Subjective:    Tina MediaMartha Ozbun today feels better-still coughing-still on around 1-2 L of oxygen.   Assessment  & Plan :   Acute Hypoxic Resp Failure due to Covid 19 Viral pneumonia: Slowly improving-remains on 1-2 L of oxygen-continue steroids and remdesivir.  Fever: afebrile  O2 requirements: On 2 l/m   COVID-19 Labs: Recent Labs    08/13/19 0600 08/14/19 0137 08/15/19 0150  DDIMER 0.57* 0.50 0.29  CRP 2.3* 1.9* 0.9    No results found for: SARSCOV2NAA   COVID-19 Medications: Steroids:11/8>> Remdesivir:11/8>> Actemra: Not indicated given mild hypoxia Convalescent Plasma: Not given Research Studies:N/A  Other medications: Diuretics:Euvolemic-no need for lasix Antibiotics:Not needed as no evidence of bacterial infection  Prone/Incentive Spirometry: encouraged incentive spirometry use 3-4/hour.  DVT Prophylaxis  :  Lovenox   Prediabetes (A1c 5.9 on 08/13/2019) with superimposed steroid-induced hyperglycemia: CBGs still on the higher side-increase NovoLog to 8 units with meals-continue with Levemir 20 units twice daily.  Follow and adjust.  CBG (last 3)  Recent Labs    08/14/19 2105 08/15/19 0712 08/15/19 1116  GLUCAP  283* 195* 239*    Obesity: Estimated body mass index is 31.69 kg/m as calculated from the following:   Height as of this encounter: 5\' 2"  (1.575 m).   Weight as of this encounter: 78.6 kg.   ABG: No results found for: PHART, PCO2ART, PO2ART, HCO3, TCO2, ACIDBASEDEF, O2SAT  Vent Settings: N/A  Condition - Stable  Family Communication  : Left a voicemail for patient's daughter.  Code Status :  Full Code  Diet :  Diet  Order            Diet Heart Room service appropriate? Yes; Fluid consistency: Thin  Diet effective now               Disposition Plan  :  Remain hospitalized  Barriers to discharge: Hypoxia requiring O2 supplementation/complete 5 days of IV Remdesivir  Consults  :  None  Procedures  :  None  Antibiotics  :    Anti-infectives (From admission, onward)   Start     Dose/Rate Route Frequency Ordered Stop   08/13/19 1600  remdesivir 100 mg in sodium chloride 0.9 % 250 mL IVPB     100 mg 500 mL/hr over 30 Minutes Intravenous Every 24 hours 08/12/19 2252 08/17/19 1559   08/12/19 2315  remdesivir 200 mg in sodium chloride 0.9 % 250 mL IVPB     200 mg 500 mL/hr over 30 Minutes Intravenous Once 08/12/19 2252 08/13/19 0002      Inpatient Medications  Scheduled Meds: . dexamethasone  6 mg Oral Q24H  . enoxaparin (LOVENOX) injection  40 mg Subcutaneous Q24H  . insulin aspart  0-15 Units Subcutaneous TID WC  . insulin aspart  0-5 Units Subcutaneous QHS  . insulin aspart  4 Units Subcutaneous TID WC  . insulin detemir  20 Units Subcutaneous BID  . mouth rinse  15 mL Mouth Rinse BID  . sodium chloride flush  3 mL Intravenous Q12H  . vitamin C  500 mg Oral Daily  . zinc sulfate  220 mg Oral Daily   Continuous Infusions: . sodium chloride    . remdesivir 100 mg in NS 250 mL Stopped (08/14/19 1819)   PRN Meds:.sodium chloride, acetaminophen, chlorpheniramine-HYDROcodone, guaiFENesin-dextromethorphan, ondansetron **OR** ondansetron (ZOFRAN) IV, sodium  chloride flush   Time Spent in minutes  25  See all Orders from today for further details   Oren Binet M.D on 08/15/2019 at 2:04 PM  To page go to [email or web address] - use universal password  [facility] -  Office  [phone]    Objective:   Vitals:   08/14/19 1610 08/14/19 2005 08/15/19 0526 08/15/19 0714  BP: (!) 144/83 128/68 132/89 130/71  Pulse: 65 61 (!) 52 (!) 51  Resp: 18 18  18   Temp: 98 F (36.7 C) 97.9 F (36.6 C) 97.7 F (36.5 C) 97.9 F (36.6 C)  TempSrc: Oral Oral Oral Oral  SpO2: 93% 94% 91% 94%  Weight:      Height:        Wt Readings from Last 3 Encounters:  08/12/19 78.6 kg     Intake/Output Summary (Last 24 hours) at 08/15/2019 1404 Last data filed at 08/15/2019 0900 Gross per 24 hour  Intake 1443.2 ml  Output -  Net 1443.2 ml     Physical Exam Gen Exam:Alert awake-not in any distress HEENT:atraumatic, normocephalic Chest: B/L clear to auscultation anteriorly CVS:S1S2 regular Abdomen:soft non tender, non distended Extremities:no edema Neurology: Non focal Skin: no rash   Data Review:    CBC Recent Labs  Lab 08/12/19 2300 08/13/19 0600 08/14/19 0137 08/15/19 0150  WBC 3.8* 3.1* 4.8 8.4  HGB 13.8 14.3 14.0 12.6  HCT 42.0 44.1 42.5 38.8  PLT 177 159 190 179  MCV 89.6 89.8 88.9 89.8  MCH 29.4 29.1 29.3 29.2  MCHC 32.9 32.4 32.9 32.5  RDW 12.0 11.9 11.6 11.7  LYMPHSABS 1.7 0.8 0.8 1.7  MONOABS 0.5 0.1 0.2 0.7  EOSABS 0.0 0.0 0.0 0.2  BASOSABS 0.0 0.0 0.0  0.0    Chemistries  Recent Labs  Lab 08/12/19 2300 08/13/19 0600 08/14/19 0137 08/15/19 0150  NA 138 138 141 139  K 3.4* 3.9 4.0 3.5  CL 109 108 109 107  CO2 23 21* 20* 22  GLUCOSE 121* 228* 229* 220*  BUN 17 20 22  25*  CREATININE 0.72 0.66 0.70 0.64  CALCIUM 8.3* 8.5* 9.2 8.8*  AST 30 30 24 22   ALT 32 31 28 24   ALKPHOS 49 52 64 64  BILITOT 0.9 0.6 0.9 0.3    ------------------------------------------------------------------------------------------------------------------ No results for input(s): CHOL, HDL, LDLCALC, TRIG, CHOLHDL, LDLDIRECT in the last 72 hours.  Lab Results  Component Value Date   HGBA1C 5.9 (H) 08/13/2019   ------------------------------------------------------------------------------------------------------------------ No results for input(s): TSH, T4TOTAL, T3FREE, THYROIDAB in the last 72 hours.  Invalid input(s): FREET3 ------------------------------------------------------------------------------------------------------------------ No results for input(s): VITAMINB12, FOLATE, FERRITIN, TIBC, IRON, RETICCTPCT in the last 72 hours.  Coagulation profile No results for input(s): INR, PROTIME in the last 168 hours.  Recent Labs    08/14/19 0137 08/15/19 0150  DDIMER 0.50 0.29    Cardiac Enzymes No results for input(s): CKMB, TROPONINI, MYOGLOBIN in the last 168 hours.  Invalid input(s): CK ------------------------------------------------------------------------------------------------------------------    Component Value Date/Time   BNP 12.5 08/12/2019 2300    Micro Results No results found for this or any previous visit (from the past 240 hour(s)).  Radiology Reports No results found.

## 2019-08-16 LAB — D-DIMER, QUANTITATIVE: D-Dimer, Quant: <0.27 ug/mL-FEU (ref 0.00–0.50)

## 2019-08-16 LAB — CBC WITH DIFFERENTIAL/PLATELET
Abs Immature Granulocytes: 0.04 10*3/uL (ref 0.00–0.07)
Basophils Absolute: 0 10*3/uL (ref 0.0–0.1)
Basophils Relative: 0 %
Eosinophils Absolute: 0.1 10*3/uL (ref 0.0–0.5)
Eosinophils Relative: 1 %
HCT: 39.4 % (ref 36.0–46.0)
Hemoglobin: 12.7 g/dL (ref 12.0–15.0)
Immature Granulocytes: 1 %
Lymphocytes Relative: 19 %
Lymphs Abs: 1.3 10*3/uL (ref 0.7–4.0)
MCH: 28.8 pg (ref 26.0–34.0)
MCHC: 32.2 g/dL (ref 30.0–36.0)
MCV: 89.3 fL (ref 80.0–100.0)
Monocytes Absolute: 0.6 10*3/uL (ref 0.1–1.0)
Monocytes Relative: 10 %
Neutro Abs: 4.5 10*3/uL (ref 1.7–7.7)
Neutrophils Relative %: 69 %
Platelets: 171 10*3/uL (ref 150–400)
RBC: 4.41 MIL/uL (ref 3.87–5.11)
RDW: 11.9 % (ref 11.5–15.5)
WBC: 6.5 10*3/uL (ref 4.0–10.5)
nRBC: 0 % (ref 0.0–0.2)

## 2019-08-16 LAB — COMPREHENSIVE METABOLIC PANEL
ALT: 37 U/L (ref 0–44)
AST: 32 U/L (ref 15–41)
Albumin: 3 g/dL — ABNORMAL LOW (ref 3.5–5.0)
Alkaline Phosphatase: 63 U/L (ref 38–126)
Anion gap: 8 (ref 5–15)
BUN: 23 mg/dL (ref 8–23)
CO2: 22 mmol/L (ref 22–32)
Calcium: 8.6 mg/dL — ABNORMAL LOW (ref 8.9–10.3)
Chloride: 107 mmol/L (ref 98–111)
Creatinine, Ser: 0.81 mg/dL (ref 0.44–1.00)
GFR calc Af Amer: >60 mL/min (ref >60)
GFR calc non Af Amer: >60 mL/min (ref >60)
Glucose, Bld: 174 mg/dL — ABNORMAL HIGH (ref 70–99)
Potassium: 4.1 mmol/L (ref 3.5–5.1)
Sodium: 137 mmol/L (ref 135–145)
Total Bilirubin: 0.7 mg/dL (ref 0.3–1.2)
Total Protein: 5.5 g/dL — ABNORMAL LOW (ref 6.5–8.1)

## 2019-08-16 LAB — C-REACTIVE PROTEIN: CRP: <0.8 mg/dL (ref <1.0)

## 2019-08-16 LAB — GLUCOSE, CAPILLARY: Glucose-Capillary: 232 mg/dL — ABNORMAL HIGH (ref 70–99)

## 2019-08-16 MED ORDER — GUAIFENESIN-DM 100-10 MG/5ML PO SYRP: 10.0000 mL | ORAL_SOLUTION | ORAL | 0 refills | Status: DC | PRN

## 2019-08-16 NOTE — Progress Notes (Signed)
All belongings returned to pt at bedside, $440 returned from security.

## 2019-08-16 NOTE — Discharge Summary (Signed)
PATIENT DETAILS Name: Tina Perkins Age: 78 y.o. Sex: female Date of Birth: August 02, 1941 MRN: [id number]. Admitting Physician: Coralie Keens, MD EQA:[id number], Mickle Mallory, MD  Admit Date: 08/12/2019 Discharge date: 08/16/2019  Recommendations for Outpatient Follow-up:  1. Follow up with PCP in 1-2 weeks 2. Please obtain CMP/CBC in one week 3. Repeat Chest Xray in 4-6 week  Admitted From:  Home   Disposition: Home   Home Health: No  Equipment/Devices: None  Discharge Condition: Stable  CODE STATUS: FULL CODE  Diet recommendation:  Diet Order            Diet - low sodium heart healthy        Diet Carb Modified        Diet Heart Room service appropriate? Yes; Fluid consistency: Thin  Diet effective now               Brief Summary: See H&P, Labs, Consult and Test reports for all details in brief, Patient is a 78 y.o. female with PMHx of CAD-treated with several days history of cough, fever, shortness of breath-found to have COVID-19 pneumonia and acute hypoxic respiratory failure.  See below for further details.  Brief Hospital Course: Acute Hypoxic Resp Failure due to Covid 19 Viral pneumonia: Significantly better-on room air. Per RN-ambulated around the unit on RA-gait steady.Will finish 5th dose of remdesivir prior to her discharge today. Since CRP has normalized-patient is on room air-doubt she will require steroids on discharge.  COVID-19 Labs:  Recent Labs    08/14/19 0137 08/15/19 0150 08/16/19 0015  DDIMER 0.50 0.29 <0.27  CRP 1.9* 0.9 <0.8    No results found for: SARSCOV2NAA   COVID-19 Medications: Steroids:11/8>>11/12 Remdesivir:11/8>>11/12 Actemra: Not indicated given mild hypoxia Convalescent Plasma: Not given Research Studies:N/A  Prediabetes (A1c 5.9 on 08/13/2019) with superimposed steroid-induced hyperglycemia: CBGs stable on Levemir/Novolog-since will not be on steroids-does not require any specific treatment on  discharge. Needs to follow with PCP   Obesity: Estimated body mass index is 31.69 kg/m as calculated from the following:   Height as of this encounter: 5\' 2"  (1.575 m).   Weight as of this encounter: 78.6 kg.    Procedures/Studies: None  Discharge Diagnoses:  Principal Problem:   Pneumonia due to COVID-19 virus Active Problems:   Acute respiratory disease due to COVID-19 virus   Discharge Instructions:    Person Under Monitoring Name: Tina Perkins  Location: [facility] [address] [address] [place] [address] [place]   Infection Prevention Recommendations for Individuals Confirmed to have, or Being Evaluated for, 2019 Novel Coronavirus (COVID-19) Infection Who Receive Care at Home  Individuals who are confirmed to have, or are being evaluated for, COVID-19 should follow the prevention steps below until a healthcare provider or local or state health department says they can return to normal activities.  Stay home except to get medical care You should restrict activities outside your home, except for getting medical care. Do not go to work, school, or public areas, and do not use public transportation or taxis.  Call ahead before visiting your doctor Before your medical appointment, call the healthcare provider and tell them that you have, or are being evaluated for, COVID-19 infection. This will help the healthcare providers office take steps to keep other people from getting infected. Ask your healthcare provider to call the local or state health department.  Monitor your symptoms Seek prompt medical attention if your illness is worsening (e.g., difficulty breathing). Before going to your medical  appointment, call the healthcare provider and tell them that you have, or are being evaluated for, COVID-19 infection. Ask your healthcare provider to call the local or state health department.  Wear a facemask You should wear a facemask that covers your nose and mouth when you are in the  same room with other people and when you visit a healthcare provider. People who live with or visit you should also wear a facemask while they are in the same room with you.  Separate yourself from other people in your home As much as possible, you should stay in a different room from other people in your home. Also, you should use a separate bathroom, if available.  Avoid sharing household items You should not share dishes, drinking glasses, cups, eating utensils, towels, bedding, or other items with other people in your home. After using these items, you should wash them thoroughly with soap and water.  Cover your coughs and sneezes Cover your mouth and nose with a tissue when you cough or sneeze, or you can cough or sneeze into your sleeve. Throw used tissues in a lined trash can, and immediately wash your hands with soap and water for at least 20 seconds or use an alcohol-based hand rub.  Wash your [employer] your hands often and thoroughly with soap and water for at least 20 seconds. You can use an alcohol-based hand sanitizer if soap and water are not available and if your hands are not visibly dirty. Avoid touching your eyes, nose, and mouth with unwashed hands.   Prevention Steps for Caregivers and Household Members of Individuals Confirmed to have, or Being Evaluated for, COVID-19 Infection Being Cared for in the Home  If you live with, or provide care at home for, a person confirmed to have, or being evaluated for, COVID-19 infection please follow these guidelines to prevent infection:  Follow healthcare providers instructions Make sure that you understand and can help the patient follow any healthcare provider instructions for all care.  Provide for the patients basic needs You should help the patient with basic needs in the home and provide support for getting groceries, prescriptions, and other personal needs.  Monitor the patients symptoms If they are getting  sicker, call his or her medical provider and tell them that the patient has, or is being evaluated for, COVID-19 infection. This will help the healthcare providers office take steps to keep other people from getting infected. Ask the healthcare provider to call the local or state health department.  Limit the number of people who have contact with the patient  If possible, have only one caregiver for the patient.  Other household members should stay in another home or place of residence. If this is not possible, they should stay  in another room, or be separated from the patient as much as possible. Use a separate bathroom, if available.  Restrict visitors who do not have an essential need to be in the home.  Keep older adults, very young children, and other sick people away from the patient Keep older adults, very young children, and those who have compromised immune systems or chronic health conditions away from the patient. This includes people with chronic heart, lung, or kidney conditions, diabetes, and cancer.  Ensure good ventilation Make sure that shared spaces in the home have good air flow, such as from an air conditioner or an opened window, weather permitting.  Wash your hands often  Wash your hands often and thoroughly with soap  and water for at least 20 seconds. You can use an alcohol based hand sanitizer if soap and water are not available and if your hands are not visibly dirty.  Avoid touching your eyes, nose, and mouth with unwashed hands.  Use disposable paper towels to dry your hands. If not available, use dedicated cloth towels and replace them when they become wet.  Wear a facemask and gloves  Wear a disposable facemask at all times in the room and gloves when you touch or have contact with the patients blood, body fluids, and/or secretions or excretions, such as sweat, saliva, sputum, nasal mucus, vomit, urine, or feces.  Ensure the mask fits over your nose and  mouth tightly, and do not touch it during use.  Throw out disposable facemasks and gloves after using them. Do not reuse.  Wash your hands immediately after removing your facemask and gloves.  If your personal clothing becomes contaminated, carefully remove clothing and launder. Wash your hands after handling contaminated clothing.  Place all used disposable facemasks, gloves, and other waste in a lined container before disposing them with other household waste.  Remove gloves and wash your hands immediately after handling these items.  Do not share dishes, glasses, or other household items with the patient  Avoid sharing household items. You should not share dishes, drinking glasses, cups, eating utensils, towels, bedding, or other items with a patient who is confirmed to have, or being evaluated for, COVID-19 infection.  After the person uses these items, you should wash them thoroughly with soap and water.  Wash laundry thoroughly  Immediately remove and wash clothes or bedding that have blood, body fluids, and/or secretions or excretions, such as sweat, saliva, sputum, nasal mucus, vomit, urine, or feces, on them.  Wear gloves when handling laundry from the patient.  Read and follow directions on labels of laundry or clothing items and detergent. In general, wash and dry with the warmest temperatures recommended on the label.  Clean all areas the individual has used often  Clean all touchable surfaces, such as counters, tabletops, doorknobs, bathroom fixtures, toilets, phones, keyboards, tablets, and bedside tables, every day. Also, clean any surfaces that may have blood, body fluids, and/or secretions or excretions on them.  Wear gloves when cleaning surfaces the patient has come in contact with.  Use a diluted bleach solution (e.g., dilute bleach with 1 part bleach and 10 parts water) or a household disinfectant with a label that says EPA-registered for coronaviruses. To make a  bleach solution at home, add 1 tablespoon of bleach to 1 quart (4 cups) of water. For a larger supply, add  cup of bleach to 1 gallon (16 cups) of water.  Read labels of cleaning products and follow recommendations provided on product labels. Labels contain instructions for safe and effective use of the cleaning product including precautions you should take when applying the product, such as wearing gloves or eye protection and making sure you have good ventilation during use of the product.  Remove gloves and wash hands immediately after cleaning.  Monitor yourself for signs and symptoms of illness Caregivers and household members are considered close contacts, should monitor their health, and will be asked to limit movement outside of the home to the extent possible. Follow the monitoring steps for close contacts listed on the symptom monitoring form.   ? If you have additional questions, contact your local health department or call the epidemiologist on call at [phone] (available 24/7). ? This guidance is  subject to change. For the most up-to-date guidance from CDC, please refer to their website: [employer]Met[employer].huhttps://www.cdc.gov/coronavirus/2019-ncov/hcp/guidance-prevent-spread.html    Activity:  As tolerated    Discharge Instructions    Call MD for:  difficulty breathing, headache or visual disturbances   Complete by: As directed    Call MD for:  persistant nausea and vomiting   Complete by: As directed    Call MD for:  temperature >100.4   Complete by: As directed    Diet - low sodium heart healthy   Complete by: As directed    Diet Carb Modified   Complete by: As directed    Increase activity slowly   Complete by: As directed      Allergies as of 08/16/2019      Reactions   Sulfa Antibiotics Hives      Medication List    TAKE these medications   aspirin EC 81 MG tablet Take 81 mg by mouth daily.   gabapentin 300 MG capsule Commonly known as: NEURONTIN Take 900 mg by  mouth at bedtime.   guaiFENesin-dextromethorphan 100-10 MG/5ML syrup Commonly known as: ROBITUSSIN DM Take 10 mLs by mouth every 4 (four) hours as needed for cough.   tiZANidine 4 MG tablet Commonly known as: ZANAFLEX Take 4 mg by mouth at bedtime as needed for pain.      Follow-up Information    [name], [name], MD. Schedule an appointment as soon as possible for a visit in 1 week(s).   Specialty: Family Medicine Contact information: [address] [place] [id number] [phone]          Allergies  Allergen Reactions   Sulfa Antibiotics Hives    Consultations:   None  Other Procedures/Studies: No results found.   TODAY-DAY OF DISCHARGE:  Subjective:   Tina MediaMartha Perkins today has no headache,no chest abdominal pain,no new weakness tingling or numbness, feels much better wants to go home today.   Objective:   Blood pressure (!) 146/88, pulse 61, temperature 98 F (36.7 C), temperature source Oral, resp. rate 16, height 5\' 2"  (1.575 m), weight 78.6 kg, SpO2 92 %.  Intake/Output Summary (Last 24 hours) at 08/16/2019 0859 Last data filed at 08/15/2019 1800 Gross per 24 hour  Intake 1450 ml  Output --  Net 1450 ml   Filed Weights   08/12/19 2230  Weight: 78.6 kg    Exam: Awake Alert, Oriented *3, No new F.N deficits, Normal affect [place].AT,PERRAL Supple Neck,No JVD, No cervical lymphadenopathy appriciated.  Symmetrical Chest wall movement, Good air movement bilaterally, CTAB RRR,No Gallops,Rubs or new Murmurs, No Parasternal Heave +ve B.Sounds, Abd Soft, Non tender, No organomegaly appriciated, No rebound -guarding or rigidity. No Cyanosis, Clubbing or edema, No new Rash or bruise   PERTINENT RADIOLOGIC STUDIES: No results found.   PERTINENT LAB RESULTS: CBC: Recent Labs    08/15/19 0150 08/16/19 0015  WBC 8.4 6.5  HGB 12.6 12.7  HCT 38.8 39.4  PLT 179 171   CMET CMP     Component Value Date/Time   NA 137 08/16/2019  0015   K 4.1 08/16/2019 0015   CL 107 08/16/2019 0015   CO2 22 08/16/2019 0015   GLUCOSE 174 (H) 08/16/2019 0015   BUN 23 08/16/2019 0015   CREATININE 0.81 08/16/2019 0015   CALCIUM 8.6 (L) 08/16/2019 0015   PROT 5.5 (L) 08/16/2019 0015   ALBUMIN 3.0 (L) 08/16/2019 0015   AST 32 08/16/2019 0015   ALT 37 08/16/2019 0015  ALKPHOS 63 08/16/2019 0015   BILITOT 0.7 08/16/2019 0015   GFRNONAA >60 08/16/2019 0015   GFRAA >60 08/16/2019 0015    GFR Estimated Creatinine Clearance: 55.6 mL/min (by C-G formula based on SCr of 0.81 mg/dL). No results for input(s): LIPASE, AMYLASE in the last 72 hours. No results for input(s): CKTOTAL, CKMB, CKMBINDEX, TROPONINI in the last 72 hours. Invalid input(s): POCBNP Recent Labs    08/15/19 0150 08/16/19 0015  DDIMER 0.29 <0.27   No results for input(s): HGBA1C in the last 72 hours. No results for input(s): CHOL, HDL, LDLCALC, TRIG, CHOLHDL, LDLDIRECT in the last 72 hours. No results for input(s): TSH, T4TOTAL, T3FREE, THYROIDAB in the last 72 hours.  Invalid input(s): FREET3 No results for input(s): VITAMINB12, FOLATE, FERRITIN, TIBC, IRON, RETICCTPCT in the last 72 hours. Coags: No results for input(s): INR in the last 72 hours.  Invalid input(s): PT Microbiology: No results found for this or any previous visit (from the past 240 hour(s)).  FURTHER DISCHARGE INSTRUCTIONS:  Get Medicines reviewed and adjusted: Please take all your medications with you for your next visit with your Primary MD  Laboratory/radiological data: Please request your Primary MD to go over all hospital tests and procedure/radiological results at the follow up, please ask your Primary MD to get all Hospital records sent to his/her office.  In some cases, they will be blood work, cultures and biopsy results pending at the time of your discharge. Please request that your primary care M.D. goes through all the records of your hospital data and follows up on these  results.  Also Note the following: If you experience worsening of your admission symptoms, develop shortness of breath, life threatening emergency, suicidal or homicidal thoughts you must seek medical attention immediately by calling 911 or calling your MD immediately  if symptoms less severe.  You must read complete instructions/literature along with all the possible adverse reactions/side effects for all the Medicines you take and that have been prescribed to you. Take any new Medicines after you have completely understood and accpet all the possible adverse reactions/side effects.   Do not drive when taking Pain medications or sleeping medications (Benzodaizepines)  Do not take more than prescribed Pain, Sleep and Anxiety Medications. It is not advisable to combine anxiety,sleep and pain medications without talking with your primary care practitioner  Special Instructions: If you have smoked or chewed Tobacco  in the last 2 yrs please stop smoking, stop any regular Alcohol  and or any Recreational drug use.  Wear Seat belts while driving.  Please note: You were cared for by a hospitalist during your hospital stay. Once you are discharged, your primary care physician will handle any further medical issues. Please note that NO REFILLS for any discharge medications will be authorized once you are discharged, as it is imperative that you return to your primary care physician (or establish a relationship with a primary care physician if you do not have one) for your post hospital discharge needs so that they can reassess your need for medications and monitor your lab values.  Total Time spent coordinating discharge including counseling, education and face to face time equals 35 minutes.  SignedOren Binet 08/16/2019 8:59 AM

## 2019-08-16 NOTE — Discharge Instructions (Signed)
Person Under Monitoring Name: Tina Perkins  Location: [address] [place] [address]   Infection Prevention Recommendations for Individuals Confirmed to have, or Being Evaluated for, 2019 Novel Coronavirus (COVID-19) Infection Who Receive Care at Home  Individuals who are confirmed to have, or are being evaluated for, COVID-19 should follow the prevention steps below until a healthcare provider or local or state health department says they can return to normal activities.  Stay home except to get medical care You should restrict activities outside your home, except for getting medical care. Do not go to work, school, or public areas, and do not use public transportation or taxis.  Call ahead before visiting your doctor Before your medical appointment, call the healthcare provider and tell them that you have, or are being evaluated for, COVID-19 infection. This will help the healthcare providers office take steps to keep other people from getting infected. Ask your healthcare provider to call the local or state health department.  Monitor your symptoms Seek prompt medical attention if your illness is worsening (e.g., difficulty breathing). Before going to your medical appointment, call the healthcare provider and tell them that you have, or are being evaluated for, COVID-19 infection. Ask your healthcare provider to call the local or state health department.  Wear a facemask You should wear a facemask that covers your nose and mouth when you are in the same room with other people and when you visit a healthcare provider. People who live with or visit you should also wear a facemask while they are in the same room with you.  Separate yourself from other people in your home As much as possible, you should stay in a different room from other people in your home. Also, you should use a separate bathroom, if available.  Avoid sharing household items You should not  share dishes, drinking glasses, cups, eating utensils, towels, bedding, or other items with other people in your home. After using these items, you should wash them thoroughly with soap and water.  Cover your coughs and sneezes Cover your mouth and nose with a tissue when you cough or sneeze, or you can cough or sneeze into your sleeve. Throw used tissues in a lined trash can, and immediately wash your hands with soap and water for at least 20 seconds or use an alcohol-based hand rub.  Wash your [facility] your hands often and thoroughly with soap and water for at least 20 seconds. You can use an alcohol-based hand sanitizer if soap and water are not available and if your hands are not visibly dirty. Avoid touching your eyes, nose, and mouth with unwashed hands.   Prevention Steps for Caregivers and Household Members of Individuals Confirmed to have, or Being Evaluated for, COVID-19 Infection Being Cared for in the Home  If you live with, or provide care at home for, a person confirmed to have, or being evaluated for, COVID-19 infection please follow these guidelines to prevent infection:  Follow healthcare providers instructions Make sure that you understand and can help the patient follow any healthcare provider instructions for all care.  Provide for the patients basic needs You should help the patient with basic needs in the home and provide support for getting groceries, prescriptions, and other personal needs.  Monitor the patients symptoms If they are getting sicker, call his or her medical provider and tell them that the patient has, or is being evaluated for, COVID-19 infection. This will help the  healthcare providers office take steps to keep other people from getting infected. Ask the healthcare provider to call the local or state health department.  Limit the number of people who have contact with the patient  If possible, have only one caregiver for the  patient.  Other household members should stay in another home or place of residence. If this is not possible, they should stay  in another room, or be separated from the patient as much as possible. Use a separate bathroom, if available.  Restrict visitors who do not have an essential need to be in the home.  Keep older adults, very young children, and other sick people away from the patient Keep older adults, very young children, and those who have compromised immune systems or chronic health conditions away from the patient. This includes people with chronic heart, lung, or kidney conditions, diabetes, and cancer.  Ensure good ventilation Make sure that shared spaces in the home have good air flow, such as from an air conditioner or an opened window, weather permitting.  Wash your hands often  Wash your hands often and thoroughly with soap and water for at least 20 seconds. You can use an alcohol based hand sanitizer if soap and water are not available and if your hands are not visibly dirty.  Avoid touching your eyes, nose, and mouth with unwashed hands.  Use disposable paper towels to dry your hands. If not available, use dedicated cloth towels and replace them when they become wet.  Wear a facemask and gloves  Wear a disposable facemask at all times in the room and gloves when you touch or have contact with the patients blood, body fluids, and/or secretions or excretions, such as sweat, saliva, sputum, nasal mucus, vomit, urine, or feces.  Ensure the mask fits over your nose and mouth tightly, and do not touch it during use.  Throw out disposable facemasks and gloves after using them. Do not reuse.  Wash your hands immediately after removing your facemask and gloves.  If your personal clothing becomes contaminated, carefully remove clothing and launder. Wash your hands after handling contaminated clothing.  Place all used disposable facemasks, gloves, and other waste in a lined  container before disposing them with other household waste.  Remove gloves and wash your hands immediately after handling these items.  Do not share dishes, glasses, or other household items with the patient  Avoid sharing household items. You should not share dishes, drinking glasses, cups, eating utensils, towels, bedding, or other items with a patient who is confirmed to have, or being evaluated for, COVID-19 infection.  After the person uses these items, you should wash them thoroughly with soap and water.  Wash laundry thoroughly  Immediately remove and wash clothes or bedding that have blood, body fluids, and/or secretions or excretions, such as sweat, saliva, sputum, nasal mucus, vomit, urine, or feces, on them.  Wear gloves when handling laundry from the patient.  Read and follow directions on labels of laundry or clothing items and detergent. In general, wash and dry with the warmest temperatures recommended on the label.  Clean all areas the individual has used often  Clean all touchable surfaces, such as counters, tabletops, doorknobs, bathroom fixtures, toilets, phones, keyboards, tablets, and bedside tables, every day. Also, clean any surfaces that may have blood, body fluids, and/or secretions or excretions on them.  Wear gloves when cleaning surfaces the patient has come in contact with.  Use a diluted bleach solution (e.g., dilute bleach  with 1 part bleach and 10 parts water) or a household disinfectant with a label that says EPA-registered for coronaviruses. To make a bleach solution at home, add 1 tablespoon of bleach to 1 quart (4 cups) of water. For a larger supply, add  cup of bleach to 1 gallon (16 cups) of water.  Read labels of cleaning products and follow recommendations provided on product labels. Labels contain instructions for safe and effective use of the cleaning product including precautions you should take when applying the product, such as wearing gloves or  eye protection and making sure you have good ventilation during use of the product.  Remove gloves and wash hands immediately after cleaning.  Monitor yourself for signs and symptoms of illness Caregivers and household members are considered close contacts, should monitor their health, and will be asked to limit movement outside of the home to the extent possible. Follow the monitoring steps for close contacts listed on the symptom monitoring form.   ? If you have additional questions, contact your local health department or call the epidemiologist on call at [phone] (available 24/7). ? This guidance is subject to change. For the most up-to-date guidance from [facility], please refer to their website: [email or web address]

## 2019-12-14 ENCOUNTER — Encounter: Payer: Self-pay | Admitting: Legal Medicine

## 2019-12-14 ENCOUNTER — Other Ambulatory Visit: Payer: Self-pay

## 2019-12-14 ENCOUNTER — Ambulatory Visit (INDEPENDENT_AMBULATORY_CARE_PROVIDER_SITE_OTHER): Payer: Medicare HMO | Admitting: Legal Medicine

## 2019-12-14 VITALS — BP 140/80 | HR 92 | Temp 98.0°F | Resp 17 | Ht 62.0 in | Wt 177.2 lb

## 2019-12-14 DIAGNOSIS — Q2112 Patent foramen ovale: Secondary | ICD-10-CM | POA: Insufficient documentation

## 2019-12-14 DIAGNOSIS — I6930 Unspecified sequelae of cerebral infarction: Secondary | ICD-10-CM | POA: Insufficient documentation

## 2019-12-14 DIAGNOSIS — E782 Mixed hyperlipidemia: Secondary | ICD-10-CM

## 2019-12-14 DIAGNOSIS — L304 Erythema intertrigo: Secondary | ICD-10-CM

## 2019-12-14 DIAGNOSIS — Z8619 Personal history of other infectious and parasitic diseases: Secondary | ICD-10-CM | POA: Diagnosis not present

## 2019-12-14 DIAGNOSIS — I2510 Atherosclerotic heart disease of native coronary artery without angina pectoris: Secondary | ICD-10-CM

## 2019-12-14 DIAGNOSIS — Q211 Atrial septal defect: Secondary | ICD-10-CM | POA: Insufficient documentation

## 2019-12-14 DIAGNOSIS — L299 Pruritus, unspecified: Secondary | ICD-10-CM

## 2019-12-14 DIAGNOSIS — R7303 Prediabetes: Secondary | ICD-10-CM

## 2019-12-14 HISTORY — DX: Personal history of other infectious and parasitic diseases: Z86.19

## 2019-12-14 HISTORY — DX: Unspecified sequelae of cerebral infarction: I69.30

## 2019-12-14 HISTORY — DX: Atrial septal defect: Q21.1

## 2019-12-14 HISTORY — DX: Pruritus, unspecified: L29.9

## 2019-12-14 HISTORY — DX: Patent foramen ovale: Q21.12

## 2019-12-14 HISTORY — DX: Mixed hyperlipidemia: E78.2

## 2019-12-14 MED ORDER — CLOTRIMAZOLE-BETAMETHASONE 1-0.05 % EX CREA: 1.0000 "application " | TOPICAL_CREAM | Freq: Two times a day (BID) | CUTANEOUS | 0 refills | Status: DC

## 2019-12-14 NOTE — Assessment & Plan Note (Signed)
Patient has dry skin and intertrigo under right breast.  Use lotrisone, we will check liver and thyroid.  Increase fluid intake.

## 2019-12-14 NOTE — Assessment & Plan Note (Addendum)
She has hepatitis as child, needs retesting, no evidence she has CAH.

## 2019-12-14 NOTE — Progress Notes (Signed)
Acute Office Visit  Subjective:    Patient ID: Tina Perkins, female    DOB: 22-Aug-1941, 79 y.o.   MRN: [id number]  Chief Complaint  Patient presents with  . Rash    Itching and it is over all the body since one year ago  . Alopecia    2 months ago    HPI Patient is in today for pruritis with dry skin.  Cocoa butter on it for one year.  Not seen  In >1 year.  She has had Covid pneumonia in November and admitted to [facility].  Had stoke in January 2020.  She has bilateral TKA last year. She is seeing [place]. [place]velene [place] surgery.  CORONARY ARTERY DISEASE  Patient presents in follow up of CAD. Patient was diagnosed in 29 with 2 MI. The patient has no associated CHF. The patient is currently not taking a beta blocker, statin, and aspirin. CAD was diagnosed 27 years ago.  Patient is having no angina. Patient has used no NTG.  Patient is followed by cardiology.  Patient had no stents,  . Last angiography was 1993, last echocardiogram none  Dry skin for one year with pruritis.  She had hepatitis as child but does not know type and has no follow up.  Past Medical History:  Diagnosis Date  . Arthritis   . Myocardial infarction ([place])   . Stroke [facility])     Past Surgical History:  Procedure Laterality Date  . COLON SURGERY    . JOINT REPLACEMENT      History reviewed. No pertinent family history.  Social History   Socioeconomic History  . Marital status: Widowed    Spouse name: Not on file  . Number of children: 11  . Years of education: Not on file  . Highest education level: Not on file  Occupational History  . Occupation: Retired  Tobacco Use  . Smoking status: Never Smoker  . Smokeless tobacco: Never Used  Substance and Sexual Activity  . Alcohol use: Never  . Drug use: Never  . Sexual activity: Not Currently  Other Topics Concern  . Not on file  Social History Narrative  . Not on file   Social Determinants of Health   Financial Resource Strain:  Low Risk   . Difficulty of Paying Living Expenses: Not hard at all  Food Insecurity: No Food Insecurity  . Worried About Charity fundraiser in the Last Year: Never true  . Ran Out of Food in the Last Year: Never true  Transportation Needs: No Transportation Needs  . Lack of Transportation (Medical): No  . Lack of Transportation (Non-Medical): No  Physical Activity: Inactive  . Days of Exercise per Week: 0 days  . Minutes of Exercise per Session: 0 min  Stress: No Stress Concern Present  . Feeling of Stress : Not at all  Social Connections: Moderately Isolated  . Frequency of Communication with Friends and Family: Not on file  . Frequency of Social Gatherings with Friends and Family: Three times a week  . Attends Religious Services: Never  . Active Member of Clubs or Organizations: No  . Attends Archivist Meetings: Never  . Marital Status: Widowed  Intimate Partner Violence: Not At Risk  . Fear of Current or Ex-Partner: No  . Emotionally Abused: No  . Physically Abused: No  . Sexually Abused: No    Outpatient Medications Prior to Visit  Medication Sig Dispense Refill  . aspirin EC 81  MG tablet Take 81 mg by mouth daily.    Marland Kitchen gabapentin (NEURONTIN) 300 MG capsule Take 900 mg by mouth at bedtime.    Marland Kitchen guaiFENesin-dextromethorphan (ROBITUSSIN DM) 100-10 MG/5ML syrup Take 10 mLs by mouth every 4 (four) hours as needed for cough. 118 mL 0  . tiZANidine (ZANAFLEX) 4 MG tablet Take 4 mg by mouth at bedtime as needed for pain.     No facility-administered medications prior to visit.    Allergies  Allergen Reactions  . Sulfa Antibiotics Hives    Review of Systems  Constitutional: Negative.   HENT: Negative.   Eyes: Negative.   Respiratory: Negative.   Gastrointestinal: Negative.   Endocrine: Negative.   Genitourinary: Negative.   Musculoskeletal: Negative.   Skin: Positive for rash.  Allergic/Immunologic: Negative.   Neurological: Negative.   Hematological:  Negative.   Psychiatric/Behavioral: Positive for dysphoric mood.       Objective:    Physical Exam Vitals reviewed.  Constitutional:      Appearance: Normal appearance.  HENT:     Head: Normocephalic and atraumatic.     Right Ear: Tympanic membrane normal.     Left Ear: Tympanic membrane normal.     Nose: Nose normal.  Cardiovascular:     Rate and Rhythm: Normal rate and regular rhythm.     Pulses: Normal pulses.     Heart sounds: Normal heart sounds.  Pulmonary:     Effort: Pulmonary effort is normal.     Breath sounds: Normal breath sounds.  Abdominal:     General: Abdomen is flat.     Palpations: Abdomen is soft.  Musculoskeletal:        General: Normal range of motion.     Cervical back: Normal range of motion and neck supple.  Skin:    Findings: Rash present.     Comments: Intertrigo under right breast, skin is dry   Neurological:     General: No focal deficit present.     Mental Status: She is alert and oriented to person, place, and time.   PHQ9 =11  BP 140/80 (BP Location: Right Arm, Patient Position: Sitting)   Pulse 92   Temp 98 F (36.7 C) (Temporal)   Resp 17   Ht 5\' 2"  (1.575 m)   Wt 177 lb 3.2 oz (80.4 kg)   SpO2 94%   BMI 32.41 kg/m  Wt Readings from Last 3 Encounters:  12/14/19 177 lb 3.2 oz (80.4 kg)  08/12/19 173 lb 4.5 oz (78.6 kg)    Health Maintenance Due  Topic Date Due  . TETANUS/TDAP  Never done  . DEXA SCAN  Never done  . PNA vac Low Risk Adult (1 of 2 - PCV13) Never done  . INFLUENZA VACCINE  Never done    There are no preventive care reminders to display for this patient.   No results found for: TSH Lab Results  Component Value Date   WBC 6.5 08/16/2019   HGB 12.7 08/16/2019   HCT 39.4 08/16/2019   MCV 89.3 08/16/2019   PLT 171 08/16/2019   Lab Results  Component Value Date   NA 137 08/16/2019   K 4.1 08/16/2019   CO2 22 08/16/2019   GLUCOSE 174 (H) 08/16/2019   BUN 23 08/16/2019   CREATININE 0.81 08/16/2019    BILITOT 0.7 08/16/2019   ALKPHOS 63 08/16/2019   AST 32 08/16/2019   ALT 37 08/16/2019   PROT 5.5 (L) 08/16/2019   ALBUMIN 3.0 (L) 08/16/2019  CALCIUM 8.6 (L) 08/16/2019   ANIONGAP 8 08/16/2019   No results found for: CHOL No results found for: HDL No results found for: LDLCALC No results found for: TRIG No results found for: CHOLHDL Lab Results  Component Value Date   HGBA1C 5.9 (H) 08/13/2019       Assessment & Plan:   Problem List Items Addressed This Visit      Cardiovascular and Mediastinum   Coronary artery disease involving native coronary artery of native heart without angina pectoris - Primary    An individual plan was formulated based on patient history and exam, labs and evidence based data. Patient has not had recent angina or nitroglycerin use. continue present treatment. She needs cardiology follow up which she refuses at present      Relevant Orders   CBC with Differential   Comprehensive metabolic panel     Musculoskeletal and Integument   Pruritus    Patient has dry skin and intertrigo under right breast.  Use lotrisone, we will check liver and thyroid.  Increase fluid intake.      Relevant Orders   TSH     Other   History of hepatitis    She has hepatitis as child, needs retesting, no evidence she has CAH.      Relevant Orders   Hepatitis C Antibody   Hepatitis B surface antibody,quantitative    Other Visit Diagnoses    Intertrigo       Relevant Medications   clotrimazole-betamethasone (LOTRISONE) cream       Meds ordered this encounter  Medications  . clotrimazole-betamethasone (LOTRISONE) cream    Sig: Apply 1 application topically 2 (two) times daily.    Dispense:  30 g    Refill:  0     Brent Bulla, MD

## 2019-12-14 NOTE — Assessment & Plan Note (Signed)
An individual plan was formulated based on patient history and exam, labs and evidence based data. Patient has not had recent angina or nitroglycerin use. continue present treatment. She needs cardiology follow up which she refuses at present

## 2019-12-15 LAB — CBC WITH DIFFERENTIAL/PLATELET
Basophils Absolute: 0.1 10*3/uL (ref 0.0–0.2)
Basos: 1 %
EOS (ABSOLUTE): 0.1 10*3/uL (ref 0.0–0.4)
Eos: 2 %
Hematocrit: 44.1 % (ref 34.0–46.6)
Hemoglobin: 15 g/dL (ref 11.1–15.9)
Immature Grans (Abs): 0 10*3/uL (ref 0.0–0.1)
Immature Granulocytes: 0 %
Lymphocytes Absolute: 1.5 10*3/uL (ref 0.7–3.1)
Lymphs: 27 %
MCH: 29.5 pg (ref 26.6–33.0)
MCHC: 34 g/dL (ref 31.5–35.7)
MCV: 87 fL (ref 79–97)
Monocytes Absolute: 0.5 10*3/uL (ref 0.1–0.9)
Monocytes: 9 %
Neutrophils Absolute: 3.5 10*3/uL (ref 1.4–7.0)
Neutrophils: 61 %
Platelets: 226 10*3/uL (ref 150–450)
RBC: 5.09 x10E6/uL (ref 3.77–5.28)
RDW: 11.6 % — ABNORMAL LOW (ref 11.7–15.4)
WBC: 5.7 10*3/uL (ref 3.4–10.8)

## 2019-12-15 LAB — COMPREHENSIVE METABOLIC PANEL
ALT: 26 IU/L (ref 0–32)
AST: 26 IU/L (ref 0–40)
Albumin/Globulin Ratio: 1.7 (ref 1.2–2.2)
Albumin: 4.3 g/dL (ref 3.7–4.7)
Alkaline Phosphatase: 88 IU/L (ref 39–117)
BUN/Creatinine Ratio: 25 (ref 12–28)
BUN: 17 mg/dL (ref 8–27)
Bilirubin Total: 0.4 mg/dL (ref 0.0–1.2)
CO2: 19 mmol/L — ABNORMAL LOW (ref 20–29)
Calcium: 9.6 mg/dL (ref 8.7–10.3)
Chloride: 104 mmol/L (ref 96–106)
Creatinine, Ser: 0.69 mg/dL (ref 0.57–1.00)
GFR calc Af Amer: 96 mL/min/{1.73_m2} (ref >59)
GFR calc non Af Amer: 84 mL/min/{1.73_m2} (ref >59)
Globulin, Total: 2.5 g/dL (ref 1.5–4.5)
Glucose: 136 mg/dL — ABNORMAL HIGH (ref 65–99)
Potassium: 4.7 mmol/L (ref 3.5–5.2)
Sodium: 140 mmol/L (ref 134–144)
Total Protein: 6.8 g/dL (ref 6.0–8.5)

## 2019-12-15 LAB — TSH: TSH: 0.274 u[IU]/mL — ABNORMAL LOW (ref 0.450–4.500)

## 2019-12-15 LAB — HEPATITIS B SURFACE ANTIBODY, QUANTITATIVE: Hepatitis B Surf Ab Quant: <3.1 m[IU]/mL — ABNORMAL LOW (ref >9.9)

## 2019-12-15 LAB — HEPATITIS C ANTIBODY: Hep C Virus Ab: <0.1 s/co ratio (ref 0.0–0.9)

## 2019-12-15 NOTE — Progress Notes (Signed)
CBC normal, TSH low 0.274- need to add free T4, glucose high 136, kidney and liver tests normal, Hepatitis C negative, He[atitis B- has immunity, Add A1c also to lab. lp

## 2019-12-17 ENCOUNTER — Other Ambulatory Visit: Payer: Self-pay

## 2019-12-17 DIAGNOSIS — R7309 Other abnormal glucose: Secondary | ICD-10-CM

## 2019-12-18 LAB — HGB A1C W/O EAG: Hgb A1c MFr Bld: 6.2 % — ABNORMAL HIGH (ref 4.8–5.6)

## 2019-12-18 LAB — SPECIMEN STATUS REPORT

## 2019-12-18 NOTE — Progress Notes (Signed)
A1c 6.2- patient is prediabetic- will need nurse visit to discuss diet. lp

## 2019-12-21 LAB — SPECIMEN STATUS REPORT

## 2019-12-22 NOTE — Progress Notes (Signed)
No specimen left.  Patient should come in for redrawn of free T4 and T3 lp

## 2019-12-28 ENCOUNTER — Other Ambulatory Visit: Payer: Self-pay

## 2019-12-28 ENCOUNTER — Ambulatory Visit (INDEPENDENT_AMBULATORY_CARE_PROVIDER_SITE_OTHER): Payer: Medicare HMO | Admitting: Legal Medicine

## 2019-12-28 ENCOUNTER — Encounter: Payer: Self-pay | Admitting: Legal Medicine

## 2019-12-28 VITALS — BP 136/66 | HR 74 | Temp 97.1°F | Resp 17 | Ht 59.45 in | Wt 177.8 lb

## 2019-12-28 DIAGNOSIS — R7309 Other abnormal glucose: Secondary | ICD-10-CM | POA: Diagnosis not present

## 2019-12-28 DIAGNOSIS — R7303 Prediabetes: Secondary | ICD-10-CM

## 2019-12-28 DIAGNOSIS — R7989 Other specified abnormal findings of blood chemistry: Secondary | ICD-10-CM

## 2019-12-28 DIAGNOSIS — L299 Pruritus, unspecified: Secondary | ICD-10-CM

## 2019-12-28 HISTORY — DX: Prediabetes: R73.03

## 2019-12-28 NOTE — Progress Notes (Signed)
Established Patient Office Visit  Subjective:  Patient ID: Tina Perkins, female    DOB: 15-Mar-1941  Age: 79 y.o. MRN: [id number]  CC:  Chief Complaint  Patient presents with  . Pruritus  . Prediabetes    Last Hba1c was 6.2%    HPI Tina Perkins presents for prediabetes counseling.  She is on no diet.  No diabetes medicines.  She needs counseling.  Past Medical History:  Diagnosis Date  . Arthritis   . Myocardial infarction ([place])   . Stroke [facility])     Past Surgical History:  Procedure Laterality Date  . COLON SURGERY    . JOINT REPLACEMENT      History reviewed. No pertinent family history.  Social History   Socioeconomic History  . Marital status: Widowed    Spouse name: Not on file  . Number of children: 11  . Years of education: Not on file  . Highest education level: Not on file  Occupational History  . Occupation: Retired  Tobacco Use  . Smoking status: Never Smoker  . Smokeless tobacco: Never Used  Substance and Sexual Activity  . Alcohol use: Never  . Drug use: Never  . Sexual activity: Not Currently  Other Topics Concern  . Not on file  Social History Narrative  . Not on file   Social Determinants of Health   Financial Resource Strain: Low Risk   . Difficulty of Paying Living Expenses: Not hard at all  Food Insecurity: No Food Insecurity  . Worried About Charity fundraiser in the Last Year: Never true  . Ran Out of Food in the Last Year: Never true  Transportation Needs: No Transportation Needs  . Lack of Transportation (Medical): No  . Lack of Transportation (Non-Medical): No  Physical Activity: Inactive  . Days of Exercise per Week: 0 days  . Minutes of Exercise per Session: 0 min  Stress: No Stress Concern Present  . Feeling of Stress : Not at all  Social Connections: Moderately Isolated  . Frequency of Communication with Friends and Family: Not on file  . Frequency of Social Gatherings with Friends and Family: Three times a week  .  Attends Religious Services: Never  . Active Member of Clubs or Organizations: No  . Attends Archivist Meetings: Never  . Marital Status: Widowed  Intimate Partner Violence: Not At Risk  . Fear of Current or Ex-Partner: No  . Emotionally Abused: No  . Physically Abused: No  . Sexually Abused: No    Outpatient Medications Prior to Visit  Medication Sig Dispense Refill  . clotrimazole-betamethasone (LOTRISONE) cream Apply 1 application topically 2 (two) times daily. 30 g 0   No facility-administered medications prior to visit.    Allergies  Allergen Reactions  . Sulfa Antibiotics Hives    ROS Review of Systems  Constitutional: Negative.   HENT: Negative.   Eyes: Negative.   Respiratory: Negative.   Cardiovascular: Negative.   Gastrointestinal: Negative.   Endocrine: Negative.   Genitourinary: Negative.   Musculoskeletal: Negative.   Skin: Negative.   Neurological: Negative.       Objective:    Physical Exam  Constitutional: She is oriented to person, place, and time. She appears well-developed and well-nourished.  HENT:  Head: Normocephalic and atraumatic.  Eyes: Pupils are equal, round, and reactive to light. Conjunctivae and EOM are normal.  Cardiovascular: Normal rate and normal heart sounds.  Pulmonary/Chest: Effort normal and breath sounds normal.  Abdominal: Soft. Bowel sounds are  normal.  Musculoskeletal:        General: Normal range of motion.     Cervical back: Neck supple.  Neurological: She is oriented to person, place, and time.  Skin: Skin is warm and dry.  Psychiatric: She has a normal mood and affect.  Vitals reviewed.   BP 136/66 (BP Location: Right Arm, Patient Position: Sitting)   Pulse 74   Temp (!) 97.1 F (36.2 C) (Temporal)   Resp 17   Ht 4' 11.45" (1.51 m)   Wt 177 lb 12.8 oz (80.6 kg)   BMI 35.37 kg/m  Wt Readings from Last 3 Encounters:  12/28/19 177 lb 12.8 oz (80.6 kg)  12/14/19 177 lb 3.2 oz (80.4 kg)  08/12/19  173 lb 4.5 oz (78.6 kg)     Health Maintenance Due  Topic Date Due  . TETANUS/TDAP  Never done  . DEXA SCAN  Never done  . PNA vac Low Risk Adult (1 of 2 - PCV13) Never done  . INFLUENZA VACCINE  Never done    There are no preventive care reminders to display for this patient.  Lab Results  Component Value Date   TSH 0.274 (L) 12/14/2019   Lab Results  Component Value Date   WBC 5.7 12/14/2019   HGB 15.0 12/14/2019   HCT 44.1 12/14/2019   MCV 87 12/14/2019   PLT 226 12/14/2019   Lab Results  Component Value Date   NA 140 12/14/2019   K 4.7 12/14/2019   CO2 19 (L) 12/14/2019   GLUCOSE 136 (H) 12/14/2019   BUN 17 12/14/2019   CREATININE 0.69 12/14/2019   BILITOT 0.4 12/14/2019   ALKPHOS 88 12/14/2019   AST 26 12/14/2019   ALT 26 12/14/2019   PROT 6.8 12/14/2019   ALBUMIN 4.3 12/14/2019   CALCIUM 9.6 12/14/2019   ANIONGAP 8 08/16/2019   No results found for: CHOL No results found for: HDL No results found for: LDLCALC No results found for: TRIG No results found for: CHOLHDL Lab Results  Component Value Date   HGBA1C 6.2 (H) 12/14/2019      Assessment & Plan:   Problem List Items Addressed This Visit      Musculoskeletal and Integument   Pruritus - Primary    Patient continues to have atopic dermatitis.  She is not moisturizing after shower or increasing fluid intake.  I encouraged her to do this.        Other   Prediabetes    Patient was given education on diabetes and prediabetes.  Diet and lifestyle changes explained.  She was given written instruction and Tina Perkins spoke with her 20 minutes.       Other Visit Diagnoses    Elevated glucose       Abnormal thyroid blood test       Relevant Orders   T4, Free (Completed)   Abnormal TSH          No orders of the defined types were placed in this encounter.   Follow-up: Return in about 3 months (around 03/29/2020) for fasting.    Brent Bulla, MD

## 2019-12-29 LAB — T4, FREE: Free T4: 0.96 ng/dL (ref 0.82–1.77)

## 2019-12-29 NOTE — Progress Notes (Signed)
Free T4 0.96 normal range. Recheck TSH in 3 months lp

## 2019-12-29 NOTE — Assessment & Plan Note (Signed)
Patient was given education on diabetes and prediabetes.  Diet and lifestyle changes explained.  She was given written instruction and Leia Alf spoke with her 20 minutes.

## 2019-12-29 NOTE — Assessment & Plan Note (Signed)
Patient continues to have atopic dermatitis.  She is not moisturizing after shower or increasing fluid intake.  I encouraged her to do this.

## 2020-03-31 ENCOUNTER — Ambulatory Visit (INDEPENDENT_AMBULATORY_CARE_PROVIDER_SITE_OTHER): Payer: Medicare HMO | Admitting: Legal Medicine

## 2020-03-31 ENCOUNTER — Other Ambulatory Visit: Payer: Self-pay

## 2020-03-31 ENCOUNTER — Encounter: Payer: Self-pay | Admitting: Legal Medicine

## 2020-03-31 VITALS — BP 150/86 | HR 69 | Temp 96.6°F | Resp 16 | Ht 59.0 in | Wt 180.8 lb

## 2020-03-31 DIAGNOSIS — R0600 Dyspnea, unspecified: Secondary | ICD-10-CM | POA: Diagnosis not present

## 2020-03-31 DIAGNOSIS — E782 Mixed hyperlipidemia: Secondary | ICD-10-CM

## 2020-03-31 DIAGNOSIS — Z6835 Body mass index (BMI) 35.0-35.9, adult: Secondary | ICD-10-CM

## 2020-03-31 DIAGNOSIS — I2510 Atherosclerotic heart disease of native coronary artery without angina pectoris: Secondary | ICD-10-CM

## 2020-03-31 DIAGNOSIS — Z6836 Body mass index (BMI) 36.0-36.9, adult: Secondary | ICD-10-CM

## 2020-03-31 DIAGNOSIS — R0609 Other forms of dyspnea: Secondary | ICD-10-CM

## 2020-03-31 DIAGNOSIS — R7303 Prediabetes: Secondary | ICD-10-CM

## 2020-03-31 DIAGNOSIS — Z8619 Personal history of other infectious and parasitic diseases: Secondary | ICD-10-CM

## 2020-03-31 HISTORY — DX: Body mass index (BMI) 35.0-35.9, adult: Z68.35

## 2020-03-31 LAB — PULMONARY FUNCTION TEST
FEV1/FVC: 67.6 %
FEV1: 1.08 L
FVC: 1.25 L

## 2020-03-31 NOTE — Progress Notes (Signed)
Subjective:  Patient ID: Tina Perkins, female    DOB: [birth year]  Age: 79 y.o. MRN: [id number]  Chief Complaint  Patient presents with  . Hyperlipidemia  . Prediabetes    HPI: chronic visit.  She is tired and feels bad, gaining weight. She is getting severe DOE and has no energy.  No depression, no stressors, he has frontal headaches.  Patient presents with hyperlipidemia.  Compliance with treatment has been good; patient takes medicines as directed, maintains low cholesterol diet, follows up as directed, and maintains exercise regimen.  Patient is using none without problems.  Patient has prediabetes: She is on diet. No medicines or complications.   Current Outpatient Medications on File Prior to Visit  Medication Sig Dispense Refill  . clotrimazole-betamethasone (LOTRISONE) cream Apply 1 application topically 2 (two) times daily. 30 g 0   No current facility-administered medications on file prior to visit.   Past Medical History:  Diagnosis Date  . Arthritis   . Myocardial infarction ([place])   . Stroke [facility])    Past Surgical History:  Procedure Laterality Date  . COLON SURGERY    . JOINT REPLACEMENT      History reviewed. No pertinent family history. Social History   Socioeconomic History  . Marital status: Widowed    Spouse name: Not on file  . Number of children: 11  . Years of education: Not on file  . Highest education level: Not on file  Occupational History  . Occupation: Retired  Tobacco Use  . Smoking status: Never Smoker  . Smokeless tobacco: Never Used  Vaping Use  . Vaping Use: Never used  Substance and Sexual Activity  . Alcohol use: Never  . Drug use: Never  . Sexual activity: Not Currently  Other Topics Concern  . Not on file  Social History Narrative  . Not on file   Social Determinants of Health   Financial Resource Strain: Low Risk   . Difficulty of Paying Living Expenses: Not hard at all  Food Insecurity: No Food Insecurity  . Worried  About Charity fundraiser in the Last Year: Never true  . Ran Out of Food in the Last Year: Never true  Transportation Needs: No Transportation Needs  . Lack of Transportation (Medical): No  . Lack of Transportation (Non-Medical): No  Physical Activity: Inactive  . Days of Exercise per Week: 0 days  . Minutes of Exercise per Session: 0 min  Stress: No Stress Concern Present  . Feeling of Stress : Not at all  Social Connections: Socially Isolated  . Frequency of Communication with Friends and Family: Not on file  . Frequency of Social Gatherings with Friends and Family: Three times a week  . Attends Religious Services: Never  . Active Member of Clubs or Organizations: No  . Attends Archivist Meetings: Never  . Marital Status: Widowed    Review of Systems  Constitutional: Negative.   HENT: Negative.   Eyes: Negative.   Respiratory: Positive for shortness of breath.   Cardiovascular: Negative.   Gastrointestinal: Negative.   Endocrine: Negative.   Genitourinary: Negative.   Musculoskeletal: Negative.   Neurological: Negative.   Psychiatric/Behavioral: Positive for dysphoric mood.     Objective:  BP (!) 150/86   Pulse 69   Temp (!) 96.6 F (35.9 C)   Resp 16   Ht 4\' 11"  (1.499 m)   Wt 180 lb 12.8 oz (82 kg)   SpO2 96%   BMI 36.52 kg/m  BP/Weight 03/31/2020 12/28/2019 12/14/2019  Systolic BP 150 136 140  Diastolic BP 86 66 80  Wt. (Lbs) 180.8 177.8 177.2  BMI 36.52 35.37 32.41    Physical Exam Vitals reviewed.  Constitutional:      Appearance: Normal appearance.  HENT:     Head: Normocephalic and atraumatic.     Right Ear: Tympanic membrane normal.     Left Ear: Tympanic membrane normal.     Nose: Nose normal.     Mouth/Throat:     Mouth: Mucous membranes are moist.  Eyes:     Extraocular Movements: Extraocular movements intact.     Conjunctiva/sclera: Conjunctivae normal.     Pupils: Pupils are equal, round, and reactive to light.    Cardiovascular:     Rate and Rhythm: Normal rate and regular rhythm.     Pulses: Normal pulses.     Heart sounds: Normal heart sounds.  Pulmonary:     Effort: Pulmonary effort is normal.     Breath sounds: Normal breath sounds.  Abdominal:     General: Abdomen is flat. Bowel sounds are normal.     Palpations: Abdomen is soft.  Musculoskeletal:        General: Swelling present.  Skin:    General: Skin is warm and dry.     Capillary Refill: Capillary refill takes less than 2 seconds.  Neurological:     General: No focal deficit present.     Mental Status: She is alert and oriented to person, place, and time.     EKG: NSR 60 bpm, PR , QRS , QTC 438, Axis -25, nonspecific st-T changes  PFT: FVC 137%, FEV1 142, FEV1.FVC 101%, PEF 96%  Lab Results  Component Value Date   WBC 5.7 12/14/2019   HGB 15.0 12/14/2019   HCT 44.1 12/14/2019   PLT 226 12/14/2019   GLUCOSE 136 (H) 12/14/2019   ALT 26 12/14/2019   AST 26 12/14/2019   NA 140 12/14/2019   K 4.7 12/14/2019   CL 104 12/14/2019   CREATININE 0.69 12/14/2019   BUN 17 12/14/2019   CO2 19 (L) 12/14/2019   TSH 0.274 (L) 12/14/2019   HGBA1C 6.2 (H) 12/14/2019      Assessment & Plan:   1. Mixed hyperlipidemia - Lipid panel AN INDIVIDUAL CARE PLAN for hyperlipidemia/ cholesterol was established and reinforced today.  The patient's status was assessed using clinical findings on exam, lab and other diagnostic tests. The patient's disease status was assessed based on evidence-based guidelines and found to be well controlled. MEDICATIONS were reviewed. SELF MANAGEMENT GOALS have been discussed and patient's success at attaining the goal of low cholesterol was assessed. RECOMMENDATION given include regular exercise 3 days a week and low cholesterol/low fat diet. CLINICAL SUMMARY including written plan to identify barriers unique to the patient due to social or economic  reasons was discussed.  2. Prediabetes -  Hemoglobin A1c AN INDIVIDUAL CARE PLAN was established and reinforced today.  The patient's status was assessed using clinical findings on exam, labs, and other diagnostic testing. Patient's success at meeting treatment goals based on disease specific evidence-bassed guidelines and found to be in fair control. RECOMMENDATIONS include maintaining present medicines and treatment.  3. Coronary artery disease involving native coronary artery of native heart without angina pectoris - EKG 12-Lead - CBC with Differential/Platelet - Comprehensive metabolic panel - Ambulatory referral to Cardiology Patient's CAD was assessed using history and physical along with other information to maximize treatment.  Evidence based criteria  was use in deciding proper management for this disease process.  Patient's CAD is not under good control.therapy refer to cardiology.  4. History of hepatitis This is stable and has been treated in past  5. BMI 36.0-36.9,adult An individualize plan was formulated for obesity using patient history and physical exam to encourage weight loss.  An evidence based program was formulated.  Patient is to cut portion size with meals and to plan physical exercise 3 days a week at least 20 minutes.  Weight watchers and other programs are helpful.  Planned amount of weight loss 10 lbs.  6. Dyspnea on exertion - EKG 12-Lead - Pulmonary Function Test - TSH DOE I suspect is secondary to cardiac cause and deconditioning.  Refer to cardiology    No orders of the defined types were placed in this encounter.   Orders Placed This Encounter  Procedures  . CBC with Differential/Platelet  . Comprehensive metabolic panel  . Lipid panel  . TSH  . Hemoglobin A1c  . Ambulatory referral to Cardiology  . EKG 12-Lead  . Pulmonary Function Test     Follow-up: No follow-ups on file.  An After Visit Summary was printed and given to the patient.  Brent Bulla [facility] [phone]

## 2020-04-03 LAB — CBC WITH DIFFERENTIAL/PLATELET
Basophils Absolute: 0.1 10*3/uL (ref 0.0–0.2)
Basos: 1 %
EOS (ABSOLUTE): 0.2 10*3/uL (ref 0.0–0.4)
Eos: 3 %
Hematocrit: 44.2 % (ref 34.0–46.6)
Hemoglobin: 14.7 g/dL (ref 11.1–15.9)
Immature Grans (Abs): 0 10*3/uL (ref 0.0–0.1)
Immature Granulocytes: 0 %
Lymphocytes Absolute: 1.9 10*3/uL (ref 0.7–3.1)
Lymphs: 34 %
MCH: 29.2 pg (ref 26.6–33.0)
MCHC: 33.3 g/dL (ref 31.5–35.7)
MCV: 88 fL (ref 79–97)
Monocytes Absolute: 0.5 10*3/uL (ref 0.1–0.9)
Monocytes: 9 %
Neutrophils Absolute: 3 10*3/uL (ref 1.4–7.0)
Neutrophils: 53 %
Platelets: 214 10*3/uL (ref 150–450)
RBC: 5.03 x10E6/uL (ref 3.77–5.28)
RDW: 11.8 % (ref 11.7–15.4)
WBC: 5.7 10*3/uL (ref 3.4–10.8)

## 2020-04-03 LAB — TSH: TSH: 0.667 u[IU]/mL (ref 0.450–4.500)

## 2020-04-03 LAB — COMPREHENSIVE METABOLIC PANEL
ALT: 27 IU/L (ref 0–32)
AST: 23 IU/L (ref 0–40)
Albumin/Globulin Ratio: 1.6 (ref 1.2–2.2)
Albumin: 4.1 g/dL (ref 3.7–4.7)
Alkaline Phosphatase: 94 IU/L (ref 48–121)
BUN/Creatinine Ratio: 24 (ref 12–28)
BUN: 19 mg/dL (ref 8–27)
Bilirubin Total: 0.4 mg/dL (ref 0.0–1.2)
CO2: 20 mmol/L (ref 20–29)
Calcium: 9.4 mg/dL (ref 8.7–10.3)
Chloride: 104 mmol/L (ref 96–106)
Creatinine, Ser: 0.78 mg/dL (ref 0.57–1.00)
GFR calc Af Amer: 84 mL/min/{1.73_m2} (ref >59)
GFR calc non Af Amer: 73 mL/min/{1.73_m2} (ref >59)
Globulin, Total: 2.6 g/dL (ref 1.5–4.5)
Glucose: 134 mg/dL — ABNORMAL HIGH (ref 65–99)
Potassium: 4.9 mmol/L (ref 3.5–5.2)
Sodium: 138 mmol/L (ref 134–144)
Total Protein: 6.7 g/dL (ref 6.0–8.5)

## 2020-04-03 LAB — LIPID PANEL
Chol/HDL Ratio: 4 ratio (ref 0.0–4.4)
Cholesterol, Total: 186 mg/dL (ref 100–199)
HDL: 46 mg/dL (ref >39)
LDL Chol Calc (NIH): 113 mg/dL — ABNORMAL HIGH (ref 0–99)
Triglycerides: 150 mg/dL — ABNORMAL HIGH (ref 0–149)
VLDL Cholesterol Cal: 27 mg/dL (ref 5–40)

## 2020-04-03 LAB — HEMOGLOBIN A1C
Est. average glucose Bld gHb Est-mCnc: 131 mg/dL
Hgb A1c MFr Bld: 6.2 % — ABNORMAL HIGH (ref 4.8–5.6)

## 2020-04-03 LAB — CARDIOVASCULAR RISK ASSESSMENT

## 2020-04-03 NOTE — Progress Notes (Signed)
Cbc normal, glucose 134, kidney tests normal, liver tests normal, triglycerides and LDL cholesterol high consider statin, TSH low 0.274 patient needs T3, free T4 levels for hyperthyroidism, need ultrasound of thyroid lp

## 2020-04-10 ENCOUNTER — Other Ambulatory Visit: Payer: Self-pay

## 2020-04-10 ENCOUNTER — Encounter: Payer: Self-pay | Admitting: Cardiology

## 2020-04-10 ENCOUNTER — Ambulatory Visit: Payer: Medicare HMO | Admitting: Cardiology

## 2020-04-10 VITALS — BP 146/92 | HR 60 | Ht 59.0 in | Wt 183.2 lb

## 2020-04-10 DIAGNOSIS — I10 Essential (primary) hypertension: Secondary | ICD-10-CM

## 2020-04-10 DIAGNOSIS — E782 Mixed hyperlipidemia: Secondary | ICD-10-CM

## 2020-04-10 DIAGNOSIS — R072 Precordial pain: Secondary | ICD-10-CM | POA: Insufficient documentation

## 2020-04-10 DIAGNOSIS — R0602 Shortness of breath: Secondary | ICD-10-CM

## 2020-04-10 DIAGNOSIS — E669 Obesity, unspecified: Secondary | ICD-10-CM

## 2020-04-10 HISTORY — DX: Precordial pain: R07.2

## 2020-04-10 HISTORY — DX: Shortness of breath: R06.02

## 2020-04-10 HISTORY — DX: Essential (primary) hypertension: I10

## 2020-04-10 HISTORY — DX: Obesity, unspecified: E66.9

## 2020-04-10 MED ORDER — AMLODIPINE BESYLATE 2.5 MG PO TABS
2.5000 mg | ORAL_TABLET | Freq: Every day | ORAL | 3 refills | Status: DC
Start: 2020-04-10 — End: 2020-07-31

## 2020-04-10 MED ORDER — METOPROLOL TARTRATE 50 MG PO TABS
50.0000 mg | ORAL_TABLET | Freq: Once | ORAL | 0 refills | Status: DC
Start: 2020-04-10 — End: 2020-06-18

## 2020-04-10 MED ORDER — NITROGLYCERIN 0.4 MG SL SUBL: 0.4000 mg | SUBLINGUAL_TABLET | SUBLINGUAL | 3 refills | Status: AC | PRN

## 2020-04-10 NOTE — Patient Instructions (Addendum)
Medication Instructions:  Your physician has recommended you make the following change in your medication:  START: Amlodipine 2.5 mg take one tablet by mouth daily.  START: Nitroglycerin 0.4 mg take one tablet by mouth every five minutes up to three times as needed for chest pain. *If you need a refill on your cardiac medications before your next appointment, please call your pharmacy*   Lab Work: Your physician recommends that you return for lab work in: Within one week of your cardiac CT.  If you have labs (blood work) drawn today and your tests are completely normal, you will receive your results only by:  [place] (if you have MyChart) OR  A paper copy in the mail If you have any lab test that is abnormal or we need to change your treatment, we will call you to review the results.   Testing/Procedures: Your cardiac CT will be scheduled at  the below location:   [facility] [address] [place], [place] [address] [phone]  If scheduled at [facility], please arrive at the [facility] main entrance of [facility] 30 minutes prior to test start time. Proceed to the [facility] Radiology Department (first floor) to check-in and test prep.  Please follow these instructions carefully (unless otherwise directed):  On the Night Before the Test:  Be sure to Drink plenty of water.  Do not consume any caffeinated/decaffeinated beverages or chocolate 12 hours prior to your test.  Do not take any antihistamines 12 hours prior to your test.  On the Day of the Test:  Drink plenty of water. Do not drink any water within one hour of the test.  Do not eat any food 4 hours prior to the test.  You may take your regular medications prior to the test.   Take metoprolol (Lopressor) two hours prior to test.  FEMALES- please wear underwire-free bra if available      After the Test:  Drink plenty of water.  After receiving IV contrast, you may  experience a mild flushed feeling. This is normal.  On occasion, you may experience a mild rash up to 24 hours after the test. This is not dangerous. If this occurs, you can take Benadryl 25 mg and increase your fluid intake.  If you experience trouble breathing, this can be serious. If it is severe call 911 IMMEDIATELY. If it is mild, please call our office.  If you take any of these medications: Glipizide/Metformin, Avandament, Glucavance, please do not take 48 hours after completing test unless otherwise instructed.   Once we have confirmed authorization from your insurance company, we will call you to set up a date and time for your test. Based on how quickly your insurance processes prior authorizations requests, please allow up to 4 weeks to be contacted for scheduling your Cardiac CT appointment. Be advised that routine Cardiac CT appointments could be scheduled as many as 8 weeks after your provider has ordered it.  For non-scheduling related questions, please contact the cardiac imaging nurse navigator should you have any questions/concerns: Marchia Bond, Cardiac Imaging Nurse Navigator Burley Saver, Interim Cardiac Imaging Nurse [place] [facility] Direct Office Dial: [phone]   For scheduling needs, including cancellations and rescheduling, please call Vivien Rota at [phone], option 3.   Your physician has requested that you have an echocardiogram. Echocardiography is a painless test that uses sound waves to create images of your heart. It provides your doctor with information  about the size and shape of your heart and how well your hearts chambers and valves are working. This procedure takes approximately one hour. There are no restrictions for this procedure.     Follow-Up: At [facility], you and your health needs are our priority.  As part of our continuing mission to provide you with exceptional heart care, we have created designated Provider  Care Teams.  These Care Teams include your primary Cardiologist (physician) and Advanced Practice Providers (APPs -  Physician Assistants and Nurse Practitioners) who all work together to provide you with the care you need, when you need it.  We recommend signing up for the patient portal called "MyChart".  Sign up information is provided on this After Visit Summary.  MyChart is used to connect with patients for Virtual Visits (Telemedicine).  Patients are able to view lab/test results, encounter notes, upcoming appointments, etc.  Non-urgent messages can be sent to your provider as well.   To learn more about what you can do with MyChart, go to [email or web address].    Your next appointment:   3 month(s)  The format for your next appointment:   In Person  Provider:   Berniece Salines, DO   Other Instructions

## 2020-04-10 NOTE — Progress Notes (Signed)
Cardiology Office Note:    Date:  04/10/2020   ID:  Tina Perkins, DOB November 08, 1940, MRN [id number]  PCP:  Lillard Anes, MD  Cardiologist:  Berniece Salines, DO  Electrophysiologist:  None   Referring MD: Lillard Anes,*   Chief Complaint  Patient presents with  . New Patient (Initial Visit)    Cheest pains off and on for the last 4 or 5 months, usually last about 5-10 minutes.     History of Present Illness:    Tina Perkins is a 79 y.o. female with a hx of myocardial infarction back in 1992 which was thought to be myocardial spasm she was treated intracardiac nitroglycerin, obesity, family history of coronary artery disease presents today to be evaluated for chest pain.  Patient tells me that she has been experiencing significant chest heaviness for the last 1 to 5 months and is getting progressively worse.  She notes that does not matter anymore what she is doing what time of the day the pain just comes on.  It last for about 5 to 10 minutes prior to resolution.  Is associated shortness of breath, and nausea.  She tells me that her shortness of breath is getting progressively worse.  No other complaints at this time.    Past Medical History:  Diagnosis Date  . Arthritis   . Myocardial infarction ([place])   . Stroke [facility])     Past Surgical History:  Procedure Laterality Date  . COLON SURGERY    . JOINT REPLACEMENT      Current Medications: Current Meds  Medication Sig  . clotrimazole-betamethasone (LOTRISONE) cream Apply 1 application topically 2 (two) times daily.     Allergies:   Sulfa antibiotics   Social History   Socioeconomic History  . Marital status: Widowed    Spouse name: Not on file  . Number of children: 11  . Years of education: Not on file  . Highest education level: Not on file  Occupational History  . Occupation: Retired  Tobacco Use  . Smoking status: Never Smoker  . Smokeless tobacco: Never Used  Vaping Use  . Vaping Use: Never  used  Substance and Sexual Activity  . Alcohol use: Never  . Drug use: Never  . Sexual activity: Not Currently  Other Topics Concern  . Not on file  Social History Narrative  . Not on file   Social Determinants of Health   Financial Resource Strain: Low Risk   . Difficulty of Paying Living Expenses: Not hard at all  Food Insecurity: No Food Insecurity  . Worried About Charity fundraiser in the Last Year: Never true  . Ran Out of Food in the Last Year: Never true  Transportation Needs: No Transportation Needs  . Lack of Transportation (Medical): No  . Lack of Transportation (Non-Medical): No  Physical Activity: Inactive  . Days of Exercise per Week: 0 days  . Minutes of Exercise per Session: 0 min  Stress: No Stress Concern Present  . Feeling of Stress : Not at all  Social Connections: Socially Isolated  . Frequency of Communication with Friends and Family: Not on file  . Frequency of Social Gatherings with Friends and Family: Three times a week  . Attends Religious Services: Never  . Active Member of Clubs or Organizations: No  . Attends Archivist Meetings: Never  . Marital Status: Widowed     Family History: The patient's family history is not on file.  ROS:  Review of Systems  Constitution: Negative for decreased appetite, fever and weight gain.  HENT: Negative for congestion, ear discharge, hoarse voice and sore throat.   Eyes: Negative for discharge, redness, vision loss in right eye and visual halos.  Cardiovascular: Negative for chest pain, dyspnea on exertion, leg swelling, orthopnea and palpitations.  Respiratory: Negative for cough, hemoptysis, shortness of breath and snoring.   Endocrine: Negative for heat intolerance and polyphagia.  Hematologic/Lymphatic: Negative for bleeding problem. Does not bruise/bleed easily.  Skin: Negative for flushing, nail changes, rash and suspicious lesions.  Musculoskeletal: Negative for arthritis, joint pain,  muscle cramps, myalgias, neck pain and stiffness.  Gastrointestinal: Negative for abdominal pain, bowel incontinence, diarrhea and excessive appetite.  Genitourinary: Negative for decreased libido, genital sores and incomplete emptying.  Neurological: Negative for brief paralysis, focal weakness, headaches and loss of balance.  Psychiatric/Behavioral: Negative for altered mental status, depression and suicidal ideas.  Allergic/Immunologic: Negative for HIV exposure and persistent infections.    EKGs/Labs/Other Studies Reviewed:    The following studies were reviewed today:   EKG:  The ekg ordered today demonstrates sinus rhythm, heart rate 60 bpm and  left axis deviation.  There is a prior EKG no significant change  Recent Labs: 08/12/2019: B Natriuretic Peptide 12.5 03/31/2020: ALT 27; BUN 19; Creatinine, Ser 0.78; Hemoglobin 14.7; Platelets 214; Potassium 4.9; Sodium 138; TSH 0.667  Recent Lipid Panel    Component Value Date/Time   CHOL 186 03/31/2020 1001   TRIG 150 (H) 03/31/2020 1001   HDL 46 03/31/2020 1001   CHOLHDL 4.0 03/31/2020 1001   LDLCALC 113 (H) 03/31/2020 1001    Physical Exam:    VS:  BP (!) 146/92 (BP Location: Left Arm, Patient Position: Sitting, Cuff Size: Normal)   Pulse 60   Ht 4' 11"  (1.499 m)   Wt 183 lb 3.2 oz (83.1 kg)   SpO2 95%   BMI 37.00 kg/m     Wt Readings from Last 3 Encounters:  04/10/20 183 lb 3.2 oz (83.1 kg)  03/31/20 180 lb 12.8 oz (82 kg)  12/28/19 177 lb 12.8 oz (80.6 kg)     GEN: Well nourished, well developed in no acute distress HEENT: Normal NECK: No JVD; No carotid bruits LYMPHATICS: No lymphadenopathy CARDIAC: S1S2 noted,RRR, no murmurs, rubs, gallops RESPIRATORY:  Clear to auscultation without rales, wheezing or rhonchi  ABDOMEN: Soft, non-tender, non-distended, +bowel sounds, no guarding. EXTREMITIES: No edema, No cyanosis, no clubbing MUSCULOSKELETAL:  No deformity  SKIN: Warm and dry NEUROLOGIC:  Alert and oriented  x 3, non-focal PSYCHIATRIC:  Normal affect, good insight  ASSESSMENT:    1. Chest pain, precordial   2. Shortness of breath   3. Essential hypertension   4. Mixed hyperlipidemia   5. Obesity (BMI 30-39.9)    PLAN:     1.  Chest pain-at this time will be beneficial to pursue an ischemic evaluation in this patient.  We discussed a coronary CTA.  She is agreeable to proceed with this.  Educated patient about this testing.  She has no IV contrast dye allergy.  2.  Shortness of breath-this could be multifactorial but for now I will get a transthoracic echocardiogram to rule out LV dysfunction cardiomyopathy as well as any valvular abnormality or diastolic dysfunction.  3.  She is slightly hypertensive in the office today.  I will get her started patient on amlodipine 2.5 mg daily.  I am hoping that this will help with her blood pressure as well as  if myocardial spasm is contributing to her chest pain this also will help.  4.  Hyperlipidemia most recent lipid profile 10 days ago shows DL 113, HDL 46, triglyceride 150, total cholesterol 186.  For now she plans diet modification we will repeat this and if needed will start patient on medication.  5.  She is prediabetic hemoglobin A1c is 6.2.  Continue patient with diet modification.  The patient is in agreement with the above plan. The patient left the office in stable condition.  The patient will follow up in 3 months or sooner if needed.   Medication Adjustments/Labs and Tests Ordered: Current medicines are reviewed at length with the patient today.  Concerns regarding medicines are outlined above.  Orders Placed This Encounter  Procedures  . CT CORONARY MORPH W/CTA COR W/SCORE W/CA W/CM &/OR WO/CM  . CT CORONARY FRACTIONAL FLOW RESERVE DATA PREP  . CT CORONARY FRACTIONAL FLOW RESERVE FLUID ANALYSIS  . Basic metabolic panel  . EKG 12-Lead  . ECHOCARDIOGRAM COMPLETE   Meds ordered this encounter  Medications  . amLODipine (NORVASC)  2.5 MG tablet    Sig: Take 1 tablet (2.5 mg total) by mouth daily.    Dispense:  180 tablet    Refill:  3  . metoprolol tartrate (LOPRESSOR) 50 MG tablet    Sig: Take 1 tablet (50 mg total) by mouth once for 1 dose. Please take two hours prior to your cardiac CT    Dispense:  1 tablet    Refill:  0  . nitroGLYCERIN (NITROSTAT) 0.4 MG SL tablet    Sig: Place 1 tablet (0.4 mg total) under the tongue every 5 (five) minutes as needed for chest pain.    Dispense:  90 tablet    Refill:  3    Patient Instructions  Medication Instructions:  Your physician has recommended you make the following change in your medication:  START: Amlodipine 2.5 mg take one tablet by mouth daily.  START: Nitroglycerin 0.4 mg take one tablet by mouth every five minutes up to three times as needed for chest pain. *If you need a refill on your cardiac medications before your next appointment, please call your pharmacy*   Lab Work: Your physician recommends that you return for lab work in: Within one week of your cardiac CT.  If you have labs (blood work) drawn today and your tests are completely normal, you will receive your results only by: Marland Kitchen MyChart Message (if you have MyChart) OR . A paper copy in the mail If you have any lab test that is abnormal or we need to change your treatment, we will call you to review the results.   Testing/Procedures: Your cardiac CT will be scheduled at  the below location:   [facility] [address] [place], [place] [address] [phone]  If scheduled at [facility], please arrive at the [facility] main entrance of [facility] 30 minutes prior to test start time. Proceed to the [facility] Department (first floor) to check-in and test prep.  Please follow these instructions carefully (unless otherwise directed):  On the Night Before the Test: . Be sure to Drink plenty of water. . Do not consume any caffeinated/decaffeinated  beverages or chocolate 12 hours prior to your test. . Do not take any antihistamines 12 hours prior to your test.  On the Day of the Test: . Drink plenty of water. Do not drink any water within one hour of the  test. . Do not eat any food 4 hours prior to the test. . You may take your regular medications prior to the test.  . Take metoprolol (Lopressor) two hours prior to test. . FEMALES- please wear underwire-free bra if available      After the Test: . Drink plenty of water. . After receiving IV contrast, you may experience a mild flushed feeling. This is normal. . On occasion, you may experience a mild rash up to 24 hours after the test. This is not dangerous. If this occurs, you can take Benadryl 25 mg and increase your fluid intake. . If you experience trouble breathing, this can be serious. If it is severe call 911 IMMEDIATELY. If it is mild, please call our office. . If you take any of these medications: Glipizide/Metformin, Avandament, Glucavance, please do not take 48 hours after completing test unless otherwise instructed.   Once we have confirmed authorization from your insurance company, we will call you to set up a date and time for your test. Based on how quickly your insurance processes prior authorizations requests, please allow up to 4 weeks to be contacted for scheduling your Cardiac CT appointment. Be advised that routine Cardiac CT appointments could be scheduled as many as 8 weeks after your provider has ordered it.  For non-scheduling related questions, please contact the cardiac imaging nurse navigator should you have any questions/concerns: Marchia Bond, Cardiac Imaging Nurse Navigator Burley Saver, Interim Cardiac Imaging Nurse [place] [facility] Direct Office Dial: [phone]   For scheduling needs, including cancellations and rescheduling, please call Vivien Rota at [phone], option 3.   Your physician has requested that you have an  echocardiogram. Echocardiography is a painless test that uses sound waves to create images of your heart. It provides your doctor with information about the size and shape of your heart and how well your heart's chambers and valves are working. This procedure takes approximately one hour. There are no restrictions for this procedure.     Follow-Up: At [facility], you and your health needs are our priority.  As part of our continuing mission to provide you with exceptional heart care, we have created designated Provider Care Teams.  These Care Teams include your primary Cardiologist (physician) and Advanced Practice Providers (APPs -  Physician Assistants and Nurse Practitioners) who all work together to provide you with the care you need, when you need it.  We recommend signing up for the patient portal called "MyChart".  Sign up information is provided on this After Visit Summary.  MyChart is used to connect with patients for Virtual Visits (Telemedicine).  Patients are able to view lab/test results, encounter notes, upcoming appointments, etc.  Non-urgent messages can be sent to your provider as well.   To learn more about what you can do with MyChart, go to [email or web address].    Your next appointment:   3 month(s)  The format for your next appointment:   In Person  Provider:   Berniece Salines, DO   Other Instructions      Adopting a Healthy Lifestyle.  Know what a healthy weight is for you (roughly BMI <25) and aim to maintain this   Aim for 7+ servings of fruits and vegetables daily   65-80+ fluid ounces of water or unsweet tea for healthy kidneys   Limit to max 1 drink of alcohol per day; avoid smoking/tobacco   Limit animal fats in diet for cholesterol and heart health - choose grass  fed whenever available   Avoid highly processed foods, and foods high in saturated/trans fats   Aim for low stress - take time to unwind and care for your mental health   Aim for 150  min of moderate intensity exercise weekly for heart health, and weights twice weekly for bone health   Aim for 7-9 hours of sleep daily   When it comes to diets, agreement about the perfect plan isnt easy to find, even among the experts. Experts at the [employer] developed an idea known as the Healthy Eating Plate. Just imagine a plate divided into logical, healthy portions.   The emphasis is on diet quality:   Load up on vegetables and fruits - one-half of your plate: Aim for color and variety, and remember that potatoes dont count.   Go for whole grains - one-quarter of your plate: Whole wheat, barley, wheat berries, quinoa, oats, brown rice, and foods made with them. If you want pasta, go with whole wheat pasta.   Protein power - one-quarter of your plate: Fish, chicken, beans, and nuts are all healthy, versatile protein sources. Limit red meat.   The diet, however, does go beyond the plate, offering a few other suggestions.   Use healthy plant oils, such as olive, canola, soy, corn, sunflower and peanut. Check the labels, and avoid partially hydrogenated oil, which have unhealthy trans fats.   If youre thirsty, drink water. Coffee and tea are good in moderation, but skip sugary drinks and limit milk and dairy products to one or two daily servings.   The type of carbohydrate in the diet is more important than the amount. Some sources of carbohydrates, such as vegetables, fruits, whole grains, and beans-are healthier than others.   Finally, stay active  Signed, Berniece Salines, DO  04/10/2020 11:21 AM    [place]

## 2020-04-30 ENCOUNTER — Ambulatory Visit (INDEPENDENT_AMBULATORY_CARE_PROVIDER_SITE_OTHER): Payer: Medicare HMO

## 2020-04-30 ENCOUNTER — Other Ambulatory Visit: Payer: Self-pay

## 2020-04-30 DIAGNOSIS — R0602 Shortness of breath: Secondary | ICD-10-CM | POA: Diagnosis not present

## 2020-04-30 LAB — ECHOCARDIOGRAM COMPLETE
Area-P 1/2: 5.13 cm2
S' Lateral: 2.6 cm

## 2020-04-30 NOTE — Progress Notes (Signed)
Complete echocardiogram performed.  Jimmy Zala Degrasse RDCS, RVT  

## 2020-05-01 ENCOUNTER — Telehealth: Payer: Self-pay

## 2020-05-01 NOTE — Telephone Encounter (Signed)
-----   Message from Kardie Tobb, DO sent at 05/01/2020  9:27 AM EDT -----  Your echo showed that the relaxation of the heart  is impaired but otherwise normal echo. 

## 2020-05-01 NOTE — Telephone Encounter (Signed)
Left message on patients voicemail to please return our call.   

## 2020-05-02 ENCOUNTER — Telehealth: Payer: Self-pay

## 2020-05-02 NOTE — Telephone Encounter (Signed)
Left message on patients voicemail to please return our call.   

## 2020-05-02 NOTE — Telephone Encounter (Signed)
-----   Message from [name], DO sent at 05/01/2020  9:27 AM EDT -----  Your echo showed that the relaxation of the heart  is impaired but otherwise normal echo.

## 2020-05-05 ENCOUNTER — Telehealth: Payer: Self-pay

## 2020-05-05 NOTE — Telephone Encounter (Signed)
Spoke with patient regarding results and recommendation.  Patient verbalizes understanding and is agreeable to plan of care. Advised patient to call back with any issues or concerns.  

## 2020-05-05 NOTE — Telephone Encounter (Signed)
-----   Message from Kardie Tobb, DO sent at 05/01/2020  9:27 AM EDT -----  Your echo showed that the relaxation of the heart  is impaired but otherwise normal echo. 

## 2020-06-02 ENCOUNTER — Telehealth (HOSPITAL_COMMUNITY): Payer: Self-pay | Admitting: Emergency Medicine

## 2020-06-02 NOTE — Telephone Encounter (Signed)
Attempted to call patient regarding upcoming cardiac CT appointment. °Left message on voicemail with name and callback number °Atarah Cadogan RN Navigator Cardiac Imaging °[facility] °[phone] Office °[phone] Cell ° °

## 2020-06-03 ENCOUNTER — Ambulatory Visit (HOSPITAL_COMMUNITY): Admission: RE | Admit: 2020-06-03 | Payer: Medicare HMO | Source: Ambulatory Visit

## 2020-06-12 LAB — BASIC METABOLIC PANEL
BUN/Creatinine Ratio: 20 (ref 12–28)
BUN: 15 mg/dL (ref 8–27)
CO2: 17 mmol/L — ABNORMAL LOW (ref 20–29)
Calcium: 9.7 mg/dL (ref 8.7–10.3)
Chloride: 104 mmol/L (ref 96–106)
Creatinine, Ser: 0.74 mg/dL (ref 0.57–1.00)
GFR calc Af Amer: 89 mL/min/{1.73_m2} (ref >59)
GFR calc non Af Amer: 77 mL/min/{1.73_m2} (ref >59)
Glucose: 239 mg/dL — ABNORMAL HIGH (ref 65–99)
Potassium: 4.1 mmol/L (ref 3.5–5.2)
Sodium: 140 mmol/L (ref 134–144)

## 2020-06-13 ENCOUNTER — Telehealth: Payer: Self-pay

## 2020-06-13 NOTE — Telephone Encounter (Signed)
-----   Message from [name], DO sent at 06/12/2020  5:11 PM EDT ----- Your blood glucose is elevated compared to 2 months ago, otherwise stable labs

## 2020-06-13 NOTE — Telephone Encounter (Signed)
Left message on patients voicemail to please return our call.   

## 2020-06-16 ENCOUNTER — Telehealth: Payer: Self-pay

## 2020-06-16 ENCOUNTER — Telehealth (HOSPITAL_COMMUNITY): Payer: Self-pay | Admitting: Emergency Medicine

## 2020-06-16 NOTE — Telephone Encounter (Signed)
Attempted to call patient regarding upcoming cardiac CT appointment. °Left message on voicemail with name and callback number °Ocie Tino RN Navigator Cardiac Imaging °[facility] °[phone] Office °[phone] Cell ° °

## 2020-06-16 NOTE — Telephone Encounter (Signed)
Left message on patients voicemail to please return our call.   

## 2020-06-16 NOTE — Telephone Encounter (Signed)
-----   Message from Kardie Tobb, DO sent at 06/12/2020  5:11 PM EDT ----- Your blood glucose is elevated compared to 2 months ago, otherwise stable labs 

## 2020-06-17 ENCOUNTER — Ambulatory Visit (HOSPITAL_COMMUNITY)
Admission: RE | Admit: 2020-06-17 | Discharge: 2020-06-17 | Disposition: A | Discharge status: Home / Self Care | Payer: Medicare HMO | Source: Ambulatory Visit | Attending: Cardiology | Admitting: Cardiology

## 2020-06-17 ENCOUNTER — Telehealth: Payer: Self-pay

## 2020-06-17 ENCOUNTER — Other Ambulatory Visit: Payer: Self-pay

## 2020-06-17 DIAGNOSIS — R072 Precordial pain: Secondary | ICD-10-CM

## 2020-06-17 NOTE — Progress Notes (Signed)
Pt arrived late for her appt. Advised pt that we will not be able to accommodate her appt time today and we will have CT Heart Navigator call to reschedule appt and reorder medications as needed. Pt states that she was not sure which building to come to. Advised pt that CT Heart Nav tried to call, pt states that she does not have good phone service. Pt states that the best number to contact her at is [phone].

## 2020-06-17 NOTE — Telephone Encounter (Signed)
-----   Message from Kardie Tobb, DO sent at 06/12/2020  5:11 PM EDT ----- Your blood glucose is elevated compared to 2 months ago, otherwise stable labs 

## 2020-06-17 NOTE — Telephone Encounter (Signed)
Spoke with patient regarding results and recommendation.  Patient verbalizes understanding and is agreeable to plan of care. Advised patient to call back with any issues or concerns.  

## 2020-06-18 ENCOUNTER — Other Ambulatory Visit (HOSPITAL_COMMUNITY): Payer: Self-pay | Admitting: Emergency Medicine

## 2020-06-18 ENCOUNTER — Telehealth (HOSPITAL_COMMUNITY): Payer: Self-pay | Admitting: Emergency Medicine

## 2020-06-18 DIAGNOSIS — I2510 Atherosclerotic heart disease of native coronary artery without angina pectoris: Secondary | ICD-10-CM

## 2020-06-18 MED ORDER — METOPROLOL TARTRATE 50 MG PO TABS: 50.0000 mg | ORAL_TABLET | Freq: Once | ORAL | 0 refills | Status: DC

## 2020-06-18 NOTE — Telephone Encounter (Signed)
Attempted to call patient regarding upcoming cardiac CT appointment. Left message on voicemail with name and callback number Rockwell Alexandria RN Navigator Cardiac Imaging [facility] [phone] Office [phone] Cell   Left detailed message that metoprolol was resubmitted to her pharm to pick up and take 2 hr prior to appt

## 2020-06-19 ENCOUNTER — Ambulatory Visit (HOSPITAL_COMMUNITY)
Admission: RE | Admit: 2020-06-19 | Discharge: 2020-06-19 | Disposition: A | Discharge status: Home / Self Care | Payer: Medicare HMO | Source: Ambulatory Visit | Attending: Cardiology | Admitting: Cardiology

## 2020-06-19 ENCOUNTER — Other Ambulatory Visit: Payer: Self-pay

## 2020-06-19 DIAGNOSIS — R072 Precordial pain: Secondary | ICD-10-CM | POA: Insufficient documentation

## 2020-06-19 MED ORDER — NITROGLYCERIN 0.4 MG SL SUBL
SUBLINGUAL_TABLET | SUBLINGUAL | Status: AC
  Filled 2020-06-19: qty 2

## 2020-06-19 MED ORDER — IOHEXOL 350 MG/ML SOLN
80.0000 mL | Freq: Once | INTRAVENOUS | Status: AC | PRN
  Administered 2020-06-19: 80 mL via INTRAVENOUS

## 2020-06-19 MED ORDER — NITROGLYCERIN 0.4 MG SL SUBL
0.8000 mg | SUBLINGUAL_TABLET | Freq: Once | SUBLINGUAL | Status: AC
  Administered 2020-06-19: 0.8 mg via SUBLINGUAL

## 2020-06-19 NOTE — Progress Notes (Signed)
CT scan completed. Tolerated well. D/C home on wheelchair, awake and alert. In no distress

## 2020-06-20 ENCOUNTER — Telehealth: Payer: Self-pay

## 2020-06-20 NOTE — Telephone Encounter (Signed)
-----   Message from [name], DO sent at 06/19/2020 10:21 PM EDT ----- You ct showed calcium scoring of 12. You have minimal CAD. No further testing. We will continue with medical management.  The cardiac portion of the ct showed findings consistent with Hepatic steatosis.I fill forward your ct to your pcp. You may need to get an US of the liver.  Please forward the ct to her pcp.

## 2020-06-20 NOTE — Telephone Encounter (Signed)
Spoke with patient regarding results and recommendation.  Patient verbalizes understanding and is agreeable to plan of care. Advised patient to call back with any issues or concerns.  

## 2020-07-09 ENCOUNTER — Encounter: Payer: Self-pay | Admitting: Cardiology

## 2020-07-21 ENCOUNTER — Ambulatory Visit: Payer: Medicare HMO | Admitting: Cardiology

## 2020-07-31 ENCOUNTER — Other Ambulatory Visit: Payer: Self-pay

## 2020-07-31 ENCOUNTER — Ambulatory Visit (INDEPENDENT_AMBULATORY_CARE_PROVIDER_SITE_OTHER): Payer: Medicare HMO | Admitting: Legal Medicine

## 2020-07-31 ENCOUNTER — Encounter: Payer: Self-pay | Admitting: Legal Medicine

## 2020-07-31 VITALS — BP 126/80 | HR 66 | Temp 97.5°F | Ht 59.0 in | Wt 181.2 lb

## 2020-07-31 DIAGNOSIS — I951 Orthostatic hypotension: Secondary | ICD-10-CM

## 2020-07-31 DIAGNOSIS — I6930 Unspecified sequelae of cerebral infarction: Secondary | ICD-10-CM

## 2020-07-31 DIAGNOSIS — E782 Mixed hyperlipidemia: Secondary | ICD-10-CM

## 2020-07-31 DIAGNOSIS — I2510 Atherosclerotic heart disease of native coronary artery without angina pectoris: Secondary | ICD-10-CM | POA: Diagnosis not present

## 2020-07-31 DIAGNOSIS — R55 Syncope and collapse: Secondary | ICD-10-CM

## 2020-07-31 DIAGNOSIS — R42 Dizziness and giddiness: Secondary | ICD-10-CM

## 2020-07-31 DIAGNOSIS — E559 Vitamin D deficiency, unspecified: Secondary | ICD-10-CM

## 2020-07-31 DIAGNOSIS — I10 Essential (primary) hypertension: Secondary | ICD-10-CM | POA: Diagnosis not present

## 2020-07-31 DIAGNOSIS — R7303 Prediabetes: Secondary | ICD-10-CM | POA: Diagnosis not present

## 2020-07-31 MED ORDER — MECLIZINE HCL 12.5 MG PO TABS: 12.5000 mg | ORAL_TABLET | Freq: Three times a day (TID) | ORAL | 2 refills | Status: DC | PRN

## 2020-07-31 NOTE — Patient Instructions (Signed)

## 2020-07-31 NOTE — Progress Notes (Signed)
Subjective:  Patient ID: Tina Perkins, female    DOB: 06-14-1941  Age: 79 y.o. MRN: [id number]  Chief Complaint  Patient presents with  . Hyperlipidemia    45M follow up    HPI: chronic visit  Patient presents for follow up of hypertension.  Patient tolerating metoprolol well with side effects.  Patient was diagnosed with hypertension 2010 so has been treated for hypertension for 10 years.Patient is working on maintaining diet and exercise regimen and follows up as directed. Complication include none.  CORONARY ARTERY DISEASE  Patient presents in follow up of CAD. Patient was diagnosed in 2019. The patient has no associated CHF. The patient is currently taking a beta blocker, statin, and aspirin. CAD was diagnosed 2 years ago.  Patient is having some angina. Patient has used no NTG.  Patient is followed by cardiology.  Patient had stent . Last angiography was 2019, last echocardiogram 2021.  Prediabetes and on diet.  Patient has vertigo intermittant and off and on. She fell lately no LOC.  She hit her right forehead. Has used medicines in past    Current Outpatient Medications on File Prior to Visit  Medication Sig Dispense Refill  . clotrimazole-betamethasone (LOTRISONE) cream Apply 1 application topically 2 (two) times daily. 30 g 0  . metoprolol tartrate (LOPRESSOR) 50 MG tablet Take 1 tablet (50 mg total) by mouth once for 1 dose. Please take two hours prior to your cardiac CT 1 tablet 0  . nitroGLYCERIN (NITROSTAT) 0.4 MG SL tablet Place 1 tablet (0.4 mg total) under the tongue every 5 (five) minutes as needed for chest pain. 90 tablet 3   No current facility-administered medications on file prior to visit.   History reviewed. No pertinent past medical history. Past Surgical History:  Procedure Laterality Date  . COLON SURGERY    . JOINT REPLACEMENT      History reviewed. No pertinent family history. Social History   Socioeconomic History  . Marital status: Widowed     Spouse name: Not on file  . Number of children: 11  . Years of education: Not on file  . Highest education level: Not on file  Occupational History  . Occupation: Retired  Tobacco Use  . Smoking status: Never Smoker  . Smokeless tobacco: Never Used  Vaping Use  . Vaping Use: Never used  Substance and Sexual Activity  . Alcohol use: Never  . Drug use: Never  . Sexual activity: Not Currently  Other Topics Concern  . Not on file  Social History Narrative  . Not on file   Social Determinants of Health   Financial Resource Strain: Low Risk   . Difficulty of Paying Living Expenses: Not hard at all  Food Insecurity: No Food Insecurity  . Worried About Programme researcher, broadcasting/film/video in the Last Year: Never true  . Ran Out of Food in the Last Year: Never true  Transportation Needs: No Transportation Needs  . Lack of Transportation (Medical): No  . Lack of Transportation (Non-Medical): No  Physical Activity: Inactive  . Days of Exercise per Week: 0 days  . Minutes of Exercise per Session: 0 min  Stress: No Stress Concern Present  . Feeling of Stress : Not at all  Social Connections: Socially Isolated  . Frequency of Communication with Friends and Family: Not on file  . Frequency of Social Gatherings with Friends and Family: Three times a week  . Attends Religious Services: Never  . Active Member of Clubs or Organizations:  No  . Attends Club or Organization Meetings: Never  . Marital Status: Widowed    Review of Systems  Constitutional: Positive for fatigue. Negative for activity change and appetite change.  HENT: Negative.   Eyes: Negative.   Respiratory: Negative for cough, choking and shortness of breath.   Cardiovascular: Positive for chest pain. Negative for palpitations and leg swelling.  Gastrointestinal: Negative.   Endocrine: Negative.   Genitourinary: Negative.   Musculoskeletal: Negative.   Skin: Negative.   Neurological: Positive for headaches.  Psychiatric/Behavioral:  Negative.      Objective:  BP 126/80 (BP Location: Left Arm, Patient Position: Sitting, Cuff Size: Normal)   Pulse 66   Temp (!) 97.5 F (36.4 C) (Temporal)   Ht 4\' 11"  (1.499 m)   Wt 181 lb 3.2 oz (82.2 kg)   SpO2 97%   BMI 36.60 kg/m   BP/Weight 07/31/2020 06/19/2020 04/10/2020  Systolic BP 126 120 146  Diastolic BP 80 68 92  Wt. (Lbs) 181.2 - 183.2  BMI 36.6 - 37    Physical Exam Vitals reviewed.  Constitutional:      Appearance: Normal appearance.  HENT:     Right Ear: Tympanic membrane, ear canal and external ear normal.     Left Ear: Tympanic membrane and ear canal normal.     Nose: Nose normal.     Mouth/Throat:     Mouth: Mucous membranes are moist.     Pharynx: Oropharynx is clear.  Eyes:     Extraocular Movements: Extraocular movements intact.     Conjunctiva/sclera: Conjunctivae normal.     Pupils: Pupils are equal, round, and reactive to light.  Cardiovascular:     Rate and Rhythm: Normal rate and regular rhythm.     Pulses: Normal pulses.     Heart sounds: Gallop (s4) present.   Pulmonary:     Effort: Pulmonary effort is normal.  Abdominal:     General: Abdomen is flat. Bowel sounds are normal.     Palpations: Abdomen is soft.  Musculoskeletal:     Cervical back: Normal range of motion and neck supple.     Comments: Diffuse weakness  Skin:    General: Skin is dry.     Capillary Refill: Capillary refill takes less than 2 seconds.  Neurological:     Mental Status: She is alert and oriented to person, place, and time. Mental status is at baseline.     Motor: Weakness (left side and both arms) present.  Psychiatric:        Mood and Affect: Mood normal.        Thought Content: Thought content normal.       Lab Results  Component Value Date   WBC 5.7 03/31/2020   HGB 14.7 03/31/2020   HCT 44.2 03/31/2020   PLT 214 03/31/2020   GLUCOSE 239 (H) 06/12/2020   CHOL 186 03/31/2020   TRIG 150 (H) 03/31/2020   HDL 46 03/31/2020   LDLCALC 113 (H)  03/31/2020   ALT 27 03/31/2020   AST 23 03/31/2020   NA 140 06/12/2020   K 4.1 06/12/2020   CL 104 06/12/2020   CREATININE 0.74 06/12/2020   BUN 15 06/12/2020   CO2 17 (L) 06/12/2020   TSH 0.667 03/31/2020   HGBA1C 6.2 (H) 03/31/2020      Assessment & Plan:   1. Coronary artery disease involving native coronary artery of native heart without angina pectoris Patient's CAD was assessed using history and physical along with other  information to maximize treatment.  Evidence based criteria was use in deciding proper management for this disease process.  Patient's CAD is under good control.therapy continue present medicines..  2. Essential hypertension - CBC with Differential/Platelet - Comprehensive metabolic panel An individual hypertension care plan was established and reinforced today.  The patient's status was assessed using clinical findings on exam and labs or diagnostic tests. The patient's success at meeting treatment goals on disease specific evidence-based guidelines and found to be well controlled. SELF MANAGEMENT: The patient and I together assessed ways to personally work towards obtaining the recommended goals. RECOMMENDATIONS: avoid decongestants found in common cold remedies, decrease consumption of alcohol, perform routine monitoring of BP with home BP cuff, exercise, reduction of dietary salt, take medicines as prescribed, try not to miss doses and quit smoking.  Regular exercise and maintaining a healthy weight is needed.  Stress reduction may help. A CLINICAL SUMMARY including written plan identify barriers to care unique to individual due to social or financial issues.  We attempt to mutually creat solutions for individual and family understanding.  3. Mixed hyperlipidemia - Lipid panel AN INDIVIDUAL CARE PLAN for hyperlipidemia/ cholesterol was established and reinforced today.  The patient's status was assessed using clinical findings on exam, lab and other diagnostic  tests. The patient's disease status was assessed based on evidence-based guidelines and found to be well controlled. MEDICATIONS were reviewed. SELF MANAGEMENT GOALS have been discussed and patient's success at attaining the goal of low cholesterol was assessed. RECOMMENDATION given include regular exercise 3 days a week and low cholesterol/low fat diet. CLINICAL SUMMARY including written plan to identify barriers unique to the patient due to social or economic  reasons was discussed.  4. Prediabetes - Hemoglobin A1c Patient is on diet and exercises  5. Sequelae of cerebral infarction Patient is generally weak but worse with left arm and leg  6. Hypovitaminosis D - VITAMIN D 25 Hydroxy (Vit-D Deficiency, Fractures); Future Check vitamin D level, history of fracture  7. Orthostasis - Aldosterone Orthostatic measurements show no orthostatis.  She dizzy when standing  8. Vertigo - TSH - MR Brain W Wo Contrast - meclizine (ANTIVERT) 12.5 MG tablet; Take 1 tablet (12.5 mg total) by mouth 3 (three) times daily as needed for dizziness.  Dispense: 30 tablet; Refill: 2 AN INDIVIDUAL CARE PLAN for vertigo was established and reinforced today.  The patient's status was assessed using clinical findings on exam, labs, and other diagnostic testing. Patient's success at meeting treatment goals based on disease specific evidence-bassed guidelines and found to be in poor control. RECOMMENDATIONS include changing present medicines and treatment.  9. Syncope, unspecified syncope type - MR Brain W Wo Contrast Patient needs syncope workup, she is to see cardiology today    Meds ordered this encounter  Medications  . meclizine (ANTIVERT) 12.5 MG tablet    Sig: Take 1 tablet (12.5 mg total) by mouth 3 (three) times daily as needed for dizziness.    Dispense:  30 tablet    Refill:  2    Orders Placed This Encounter  Procedures  . MR Brain W Wo Contrast  . CBC with Differential/Platelet  .  Comprehensive metabolic panel  . Lipid panel  . TSH  . Aldosterone  . VITAMIN D 25 Hydroxy (Vit-D Deficiency, Fractures)  . Hemoglobin A1c     Follow-up: Return in about 2 weeks (around 08/14/2020) for for vertigo.  An After Visit Summary was printed and given to the patient.  Brent Bulla  [place] [phone]

## 2020-08-07 ENCOUNTER — Ambulatory Visit (INDEPENDENT_AMBULATORY_CARE_PROVIDER_SITE_OTHER): Payer: Medicare HMO

## 2020-08-07 ENCOUNTER — Other Ambulatory Visit: Payer: Self-pay

## 2020-08-07 ENCOUNTER — Ambulatory Visit: Payer: Medicare HMO | Admitting: Cardiology

## 2020-08-07 ENCOUNTER — Encounter: Payer: Self-pay | Admitting: Cardiology

## 2020-08-07 VITALS — BP 130/88 | HR 78 | Ht 59.0 in | Wt 180.8 lb

## 2020-08-07 DIAGNOSIS — I10 Essential (primary) hypertension: Secondary | ICD-10-CM | POA: Diagnosis not present

## 2020-08-07 DIAGNOSIS — I2510 Atherosclerotic heart disease of native coronary artery without angina pectoris: Secondary | ICD-10-CM | POA: Diagnosis not present

## 2020-08-07 DIAGNOSIS — R002 Palpitations: Secondary | ICD-10-CM

## 2020-08-07 DIAGNOSIS — R42 Dizziness and giddiness: Secondary | ICD-10-CM

## 2020-08-07 LAB — CBC WITH DIFFERENTIAL/PLATELET
Basophils Absolute: 0.1 10*3/uL (ref 0.0–0.2)
Basos: 1 %
EOS (ABSOLUTE): 0.2 10*3/uL (ref 0.0–0.4)
Eos: 2 %
Hematocrit: 43.7 % (ref 34.0–46.6)
Hemoglobin: 14.5 g/dL (ref 11.1–15.9)
Immature Grans (Abs): 0 10*3/uL (ref 0.0–0.1)
Immature Granulocytes: 0 %
Lymphocytes Absolute: 1.8 10*3/uL (ref 0.7–3.1)
Lymphs: 20 %
MCH: 29.1 pg (ref 26.6–33.0)
MCHC: 33.2 g/dL (ref 31.5–35.7)
MCV: 88 fL (ref 79–97)
Monocytes Absolute: 0.6 10*3/uL (ref 0.1–0.9)
Monocytes: 7 %
Neutrophils Absolute: 6.3 10*3/uL (ref 1.4–7.0)
Neutrophils: 70 %
Platelets: 252 10*3/uL (ref 150–450)
RBC: 4.99 x10E6/uL (ref 3.77–5.28)
RDW: 11.6 % — ABNORMAL LOW (ref 11.7–15.4)
WBC: 8.8 10*3/uL (ref 3.4–10.8)

## 2020-08-07 LAB — LIPID PANEL
Chol/HDL Ratio: 3.8 ratio (ref 0.0–4.4)
Cholesterol, Total: 173 mg/dL (ref 100–199)
HDL: 46 mg/dL (ref >39)
LDL Chol Calc (NIH): 100 mg/dL — ABNORMAL HIGH (ref 0–99)
Triglycerides: 154 mg/dL — ABNORMAL HIGH (ref 0–149)
VLDL Cholesterol Cal: 27 mg/dL (ref 5–40)

## 2020-08-07 LAB — COMPREHENSIVE METABOLIC PANEL
ALT: 23 IU/L (ref 0–32)
AST: 20 IU/L (ref 0–40)
Albumin/Globulin Ratio: 1.7 (ref 1.2–2.2)
Albumin: 4.4 g/dL (ref 3.7–4.7)
Alkaline Phosphatase: 90 IU/L (ref 44–121)
BUN/Creatinine Ratio: 21 (ref 12–28)
BUN: 14 mg/dL (ref 8–27)
Bilirubin Total: 0.5 mg/dL (ref 0.0–1.2)
CO2: 21 mmol/L (ref 20–29)
Calcium: 9.3 mg/dL (ref 8.7–10.3)
Chloride: 105 mmol/L (ref 96–106)
Creatinine, Ser: 0.67 mg/dL (ref 0.57–1.00)
GFR calc Af Amer: 97 mL/min/{1.73_m2} (ref >59)
GFR calc non Af Amer: 84 mL/min/{1.73_m2} (ref >59)
Globulin, Total: 2.6 g/dL (ref 1.5–4.5)
Glucose: 137 mg/dL — ABNORMAL HIGH (ref 65–99)
Potassium: 4.7 mmol/L (ref 3.5–5.2)
Sodium: 140 mmol/L (ref 134–144)
Total Protein: 7 g/dL (ref 6.0–8.5)

## 2020-08-07 LAB — HEMOGLOBIN A1C
Est. average glucose Bld gHb Est-mCnc: 140 mg/dL
Hgb A1c MFr Bld: 6.5 % — ABNORMAL HIGH (ref 4.8–5.6)

## 2020-08-07 LAB — CARDIOVASCULAR RISK ASSESSMENT

## 2020-08-07 LAB — TSH: TSH: 0.372 u[IU]/mL — ABNORMAL LOW (ref 0.450–4.500)

## 2020-08-07 LAB — ALDOSTERONE: ALDOSTERONE: 6.8 ng/dL (ref 0.0–30.0)

## 2020-08-07 MED ORDER — LOSARTAN POTASSIUM 25 MG PO TABS: 25.0000 mg | ORAL_TABLET | Freq: Every day | ORAL | 1 refills | Status: DC

## 2020-08-07 NOTE — Progress Notes (Signed)
Cardiology Office Note:    Date:  08/07/2020   ID:  Tina Perkins, DOB April 05, 1941, MRN [id number]  PCP:  [name], MD  Cardiologist:  [name], DO  Electrophysiologist:  None   Referring MD: [name],*   " I am doing well"   History of Present Illness:    Tina Perkins is a 79 y.o. female with a hx of myocardial infarction back in 1992 which was thought to be myocardial spasm she was treated intracardiac nitroglycerin, obesity, family history of coronary artery disease.  At her initial presentation the patient complained of having chest pain.  I had her undergo a coronary CTA.  At that time she did.  She did get her echocardiogram I saw her coronary CTA since I last saw her.  Her results have been called out to her. Today she tells me that she is experiencing intermittent palpitations.  She notes as abrupt fast heartbeat which stops randomly.  She is concerned about this is mostly at night.  No other complaints at this time. She also tells me that she is having worsening vertigo she was placed on meclizine.  She has MRI that has been scheduled by her PCP.  Past Medical History:  Diagnosis Date  . Acute respiratory disease due to COVID-19 virus 08/12/2019  . BMI 35.0-35.9,adult 03/31/2020  . Chest pain, precordial 04/10/2020  . Coronary artery disease involving native coronary artery of native heart without angina pectoris 05/10/2016   Apparently coronary intervention done in 1993 in [place] She was fine to have 75% narrowing of the mid LAD, nitroglycerin was given reduction to 30-40% it was also described as bridging in that area  . Essential hypertension 04/10/2020  . History of hepatitis 12/14/2019  . Mixed hyperlipidemia 12/14/2019  . Obesity (BMI 30-39.9) 04/10/2020  . Patent foramen ovale 12/14/2019  . Pneumonia due to COVID-19 virus 08/12/2019  . Prediabetes 12/28/2019  . Pruritus 12/14/2019  . Sequelae of cerebral infarction 12/14/2019  . Shortness of breath  04/10/2020  . Spinal stenosis, lumbar region with neurogenic claudication 02/28/2019   Formatting of this note might be different from the original. Added automatically from request for surgery [id number] Formatting of this note might be different from the original. Added automatically from request for surgery [id number]    Past Surgical History:  Procedure Laterality Date  . COLON SURGERY    . JOINT REPLACEMENT      Current Medications: Current Meds  Medication Sig  . clotrimazole-betamethasone (LOTRISONE) cream Apply 1 application topically 2 (two) times daily.  . meclizine (ANTIVERT) 12.5 MG tablet Take 1 tablet (12.5 mg total) by mouth 3 (three) times daily as needed for dizziness.  . metoprolol tartrate (LOPRESSOR) 50 MG tablet Take 1 tablet (50 mg total) by mouth once for 1 dose. Please take two hours prior to your cardiac CT  . nitroGLYCERIN (NITROSTAT) 0.4 MG SL tablet Place 1 tablet (0.4 mg total) under the tongue every 5 (five) minutes as needed for chest pain.  . [DISCONTINUED] amLODipine (NORVASC) 2.5 MG tablet Take 2.5 mg by mouth daily.     Allergies:   Sulfa antibiotics   Social History   Socioeconomic History  . Marital status: Widowed    Spouse name: Not on file  . Number of children: 11  . Years of education: Not on file  . Highest education level: Not on file  Occupational History  . Occupation: Retired  Tobacco Use  . Smoking status: Never Smoker  . Smokeless  tobacco: Never Used  Vaping Use  . Vaping Use: Never used  Substance and Sexual Activity  . Alcohol use: Never  . Drug use: Never  . Sexual activity: Not Currently  Other Topics Concern  . Not on file  Social History Narrative  . Not on file   Social Determinants of Health   Financial Resource Strain: Low Risk   . Difficulty of Paying Living Expenses: Not hard at all  Food Insecurity: No Food Insecurity  . Worried About Programme researcher, broadcasting/film/videounning Out of Food in the Last Year: Never true  . Ran Out of Food in the Last  Year: Never true  Transportation Needs: No Transportation Needs  . Lack of Transportation (Medical): No  . Lack of Transportation (Non-Medical): No  Physical Activity: Inactive  . Days of Exercise per Week: 0 days  . Minutes of Exercise per Session: 0 min  Stress: No Stress Concern Present  . Feeling of Stress : Not at all  Social Connections: Socially Isolated  . Frequency of Communication with Friends and Family: Not on file  . Frequency of Social Gatherings with Friends and Family: Three times a week  . Attends Religious Services: Never  . Active Member of Clubs or Organizations: No  . Attends BankerClub or Organization Meetings: Never  . Marital Status: Widowed     Family History: The patient's family history includes Cancer in her father and mother; Heart attack in her father and mother.  ROS:   Review of Systems  Constitution: Negative for decreased appetite, fever and weight gain.  HENT: Negative for congestion, ear discharge, hoarse voice and sore throat.   Eyes: Negative for discharge, redness, vision loss in right eye and visual halos.  Cardiovascular: Negative for chest pain, dyspnea on exertion, leg swelling, orthopnea and palpitations.  Respiratory: Negative for cough, hemoptysis, shortness of breath and snoring.   Endocrine: Negative for heat intolerance and polyphagia.  Hematologic/Lymphatic: Negative for bleeding problem. Does not bruise/bleed easily.  Skin: Negative for flushing, nail changes, rash and suspicious lesions.  Musculoskeletal: Negative for arthritis, joint pain, muscle cramps, myalgias, neck pain and stiffness.  Gastrointestinal: Negative for abdominal pain, bowel incontinence, diarrhea and excessive appetite.  Genitourinary: Negative for decreased libido, genital sores and incomplete emptying.  Neurological: Negative for brief paralysis, focal weakness, headaches and loss of balance.  Psychiatric/Behavioral: Negative for altered mental status, depression  and suicidal ideas.  Allergic/Immunologic: Negative for HIV exposure and persistent infections.    EKGs/Labs/Other Studies Reviewed:    The following studies were reviewed today:   EKG:  None   TTE IMPRESSIONS  1. Left ventricular ejection fraction, by estimation, is 60 to 65%. The left ventricle has normal function. The left ventricle has no regional wall motion abnormalities. There is mild concentric left ventricular  hypertrophy. Left ventricular diastolic  parameters are consistent with Grade II diastolic dysfunction (pseudonormalization). Elevated left atrial pressure.  2. Right ventricular systolic function is normal. The right ventricular size is normal. There is normal pulmonary artery systolic pressure.  3. The mitral valve is normal in structure. No evidence of mitral valve regurgitation. No evidence of mitral stenosis.  4. The aortic valve is normal in structure. Aortic valve regurgitation is mild. Mild aortic valve sclerosis is present, with no evidence of aortic  valve stenosis.  5. The inferior vena cava is normal in size with greater than 50% respiratory variability, suggesting right atrial pressure of 3 mmHg.    CCTA IMPRESSION: 1. Coronary calcium score of 12. This was  23 percentile for age and sex matched control.  2. Normal coronary origin with right dominance.  3. Minimal Coronary artery disease. CADRAD 1. Recommend medical therapy.  Gergory Biello, DO  Recent Labs: 08/12/2019: B Natriuretic Peptide 12.5 03/31/2020: ALT 27; Hemoglobin 14.7; Platelets 214; TSH 0.667 06/12/2020: BUN 15; Creatinine, Ser 0.74; Potassium 4.1; Sodium 140  Recent Lipid Panel    Component Value Date/Time   CHOL 186 03/31/2020 1001   TRIG 150 (H) 03/31/2020 1001   HDL 46 03/31/2020 1001   CHOLHDL 4.0 03/31/2020 1001   LDLCALC 113 (H) 03/31/2020 1001    Physical Exam:    VS:  BP 130/88   Pulse 78   Ht  (1.499 m)   Wt 180 lb 12.8 oz (82 kg)   SpO2 98%   BMI 36.52  kg/m     Wt Readings from Last 3 Encounters:  08/07/20 180 lb 12.8 oz (82 kg)  07/31/20 181 lb 3.2 oz (82.2 kg)  04/10/20 183 lb 3.2 oz (83.1 kg)     GEN: Well nourished, well developed in no acute distress HEENT: Normal NECK: No JVD; No carotid bruits LYMPHATICS: No lymphadenopathy CARDIAC: S1S2 noted,RRR, no murmurs, rubs, gallops RESPIRATORY:  Clear to auscultation without rales, wheezing or rhonchi  ABDOMEN: Soft, non-tender, non-distended, +bowel sounds, no guarding. EXTREMITIES: No edema, No cyanosis, no clubbing MUSCULOSKELETAL:  No deformity  SKIN: Warm and dry NEUROLOGIC:  Alert and oriented x 3, non-focal PSYCHIATRIC:  Normal affect, good insight  ASSESSMENT:    1. Palpitations   2. Minimal CAD   3. Primary hypertension   4. Dizziness    PLAN:     1.  We discussed her testing results.  All of her questions has been answered.  Her main problem now for her is the palpitations I like to place a monitor the patient make sure that cardiac arrhythmia is not playing a role here.  In addition she is having some dizziness and worsening lightheadedness.  She has been placed on meclizine.  She is on amlodipine.  This medication can cause orthostatic hypotension though she denies this.  I like to stop it and place her on another antihypertensive medication for now.  We will do low-dose losartan.  She also does not have any blood pressure cuff to take her blood pressure at home.  Right now she tells me she does not have remains therefore we will go ahead and see if our social service department can help the patient with a blood pressure cuff.  If this is at all possible it will be mailed out to her.  She defers on any statin medication at this time.  We will continue to monitor the patient.  The patient is in agreement with the above plan. The patient left the office in stable condition.  The patient will follow up in 3 months or sooner if needed.   Medication Adjustments/Labs and  Tests Ordered: Current medicines are reviewed at length with the patient today.  Concerns regarding medicines are outlined above.  Orders Placed This Encounter  Procedures  . LONG TERM MONITOR (3-14 DAYS)   Meds ordered this encounter  Medications  . losartan (COZAAR) 25 MG tablet    Sig: Take 1 tablet (25 mg total) by mouth daily.    Dispense:  90 tablet    Refill:  1    Patient Instructions  Medication Instructions:  Your physician has recommended you make the following change in your medication:   STOP:  Amlodipine   START: Losartan 25 mg daily  *If you need a refill on your cardiac medications before your next appointment, please call your pharmacy*   Lab Work: None If you have labs (blood work) drawn today and your tests are completely normal, you will receive your results only by: Marland Kitchen MyChart Message (if you have MyChart) OR . A paper copy in the mail If you have any lab test that is abnormal or we need to change your treatment, we will call you to review the results.   Testing/Procedures: A zio monitor was ordered today. It will remain on for 14 days. You will then return monitor and event diary in provided box. It takes 1-2 weeks for report to be downloaded and returned to [place]. We will call you with the results. If monitor falls off or has orange flashing light, please call Zio for further instructions.      Follow-Up: At [facility], you and your health needs are our priority.  As part of our continuing mission to provide you with exceptional heart care, we have created designated Provider Care Teams.  These Care Teams include your primary Cardiologist (physician) and Advanced Practice Providers (APPs -  Physician Assistants and Nurse Practitioners) who all work together to provide you with the care you need, when you need it.  We recommend signing up for the patient portal called "MyChart".  Sign up information is provided on this After Visit Summary.  MyChart is  used to connect with patients for Virtual Visits (Telemedicine).  Patients are able to view lab/test results, encounter notes, upcoming appointments, etc.  Non-urgent messages can be sent to your provider as well.   To learn more about what you can do with MyChart, go to [email or web address].    Your next appointment:   3 month(s)  The format for your next appointment:   In Person  Provider:   [name], DO   Other Instructions  Losartan Tablets What is this medicine? LOSARTAN (loe SAR tan) is an angiotensin II receptor blocker, also known as an ARB. It treats high blood pressure. It can slow kidney damage in some patients. It may also be used to lower the risk of stroke. This medicine may be used for other purposes; ask your health care provider or pharmacist if you have questions. COMMON BRAND NAME(S): Cozaar What should I tell my health care provider before I take this medicine? They need to know if you have any of these conditions:  heart failure  kidney or liver disease  an unusual or allergic reaction to losartan, other medicines, foods, dyes, or preservatives  pregnant or trying to get pregnant  breast-feeding How should I use this medicine? Take this drug by mouth. Take it as directed on the prescription label at the same time every day. You can take it with or without food. If it upsets your stomach, take it with food. Keep taking it unless your health care provider tells you to stop. Talk to your health care provider about the use of this drug in children. While it may be prescribed for children as young as 6 for selected conditions, precautions do apply. Overdosage: If you think you have taken too much of this medicine contact a poison control center or emergency room at once. NOTE: This medicine is only for you. Do not share this medicine with others. What if I miss a dose? If you miss a dose, take it as soon as you can. If it is  almost time for your next dose,  take only that dose. Do not take double or extra doses. What may interact with this medicine?  blood pressure medicines  diuretics, especially triamterene, spironolactone, or amiloride  fluconazole  NSAIDs, medicines for pain and inflammation, like ibuprofen or naproxen  potassium salts or potassium supplements  rifampin This list may not describe all possible interactions. Give your health care provider a list of all the medicines, herbs, non-prescription drugs, or dietary supplements you use. Also tell them if you smoke, drink alcohol, or use illegal drugs. Some items may interact with your medicine. What should I watch for while using this medicine? Visit your doctor or health care professional for regular checks on your progress. Check your blood pressure as directed. Ask your doctor or health care professional what your blood pressure should be and when you should contact him or her. Call your doctor or health care professional if you notice an irregular or fast heart beat. Women should inform their doctor if they wish to become pregnant or think they might be pregnant. There is a potential for serious side effects to an unborn child, particularly in the second or third trimester. Talk to your health care professional or pharmacist for more information. You may get drowsy or dizzy. Do not drive, use machinery, or do anything that needs mental alertness until you know how this drug affects you. Do not stand or sit up quickly, especially if you are an older patient. This reduces the risk of dizzy or fainting spells. Alcohol can make you more drowsy and dizzy. Avoid alcoholic drinks. Avoid salt substitutes unless you are told otherwise by your doctor or health care professional. Do not treat yourself for coughs, colds, or pain while you are taking this medicine without asking your doctor or health care professional for advice. Some ingredients may increase your blood pressure. What side  effects may I notice from receiving this medicine? Side effects that you should report to your doctor or health care professional as soon as possible:  confusion, dizziness, light headedness or fainting spells  decreased amount of urine passed  difficulty breathing or swallowing, hoarseness, or tightening of the throat  fast or irregular heart beat, palpitations, or chest pain  skin rash, itching  swelling of your face, lips, tongue, hands, or feet Side effects that usually do not require medical attention (report to your doctor or health care professional if they continue or are bothersome):  cough  decreased sexual function or desire  headache  nasal congestion or stuffiness  nausea or stomach pain  sore or cramping muscles This list may not describe all possible side effects. Call your doctor for medical advice about side effects. You may report side effects to FDA at 1-800-FDA-1088. Where should I keep my medicine? Keep out of the reach of children and pets. Store at room temperature between 15 and 30 degrees C (59 and 86 degrees F). Protect from light. Keep the container tightly closed. Throw away any unused drug after the expiration date. NOTE: This sheet is a summary. It may not cover all possible information. If you have questions about this medicine, talk to your doctor, pharmacist, or health care provider.  2020 Elsevier/Gold Standard (2019-04-25 12:12:28)      Adopting a Healthy Lifestyle.  Know what a healthy weight is for you (roughly BMI <25) and aim to maintain this   Aim for 7+ servings of fruits and vegetables daily   65-80+ fluid ounces of water or  unsweet tea for healthy kidneys   Limit to max 1 drink of alcohol per day; avoid smoking/tobacco   Limit animal fats in diet for cholesterol and heart health - choose grass fed whenever available   Avoid highly processed foods, and foods high in saturated/trans fats   Aim for low stress - take time to  unwind and care for your mental health   Aim for 150 min of moderate intensity exercise weekly for heart health, and weights twice weekly for bone health   Aim for 7-9 hours of sleep daily   When it comes to diets, agreement about the perfect plan isnt easy to find, even among the experts. Experts at the [facility] [employer] developed an idea known as the Healthy Eating Plate. Just imagine a plate divided into logical, healthy portions.   The emphasis is on diet quality:   Load up on vegetables and fruits - one-half of your plate: Aim for color and variety, and remember that potatoes dont count.   Go for whole grains - one-quarter of your plate: Whole wheat, barley, wheat berries, quinoa, oats, brown rice, and foods made with them. If you want pasta, go with whole wheat pasta.   Protein power - one-quarter of your plate: Fish, chicken, beans, and nuts are all healthy, versatile protein sources. Limit red meat.   The diet, however, does go beyond the plate, offering a few other suggestions.   Use healthy plant oils, such as olive, canola, soy, corn, sunflower and peanut. Check the labels, and avoid partially hydrogenated oil, which have unhealthy trans fats.   If youre thirsty, drink water. Coffee and tea are good in moderation, but skip sugary drinks and limit milk and dairy products to one or two daily servings.   The type of carbohydrate in the diet is more important than the amount. Some sources of carbohydrates, such as vegetables, fruits, whole grains, and beans-are healthier than others.   Finally, stay active  Signed, [name], DO  08/07/2020 10:13 AM    [facility]

## 2020-08-07 NOTE — Patient Instructions (Signed)
Medication Instructions:  Your physician has recommended you make the following change in your medication:   STOP: Amlodipine   START: Losartan 25 mg daily  *If you need a refill on your cardiac medications before your next appointment, please call your pharmacy*   Lab Work: None If you have labs (blood work) drawn today and your tests are completely normal, you will receive your results only by: Marland Kitchen MyChart Message (if you have MyChart) OR . A paper copy in the mail If you have any lab test that is abnormal or we need to change your treatment, we will call you to review the results.   Testing/Procedures: A zio monitor was ordered today. It will remain on for 14 days. You will then return monitor and event diary in provided box. It takes 1-2 weeks for report to be downloaded and returned to [place]. We will call you with the results. If monitor falls off or has orange flashing light, please call Zio for further instructions.      Follow-Up: At [facility], you and your health needs are our priority.  As part of our continuing mission to provide you with exceptional heart care, we have created designated Provider Care Teams.  These Care Teams include your primary Cardiologist (physician) and Advanced Practice Providers (APPs -  Physician Assistants and Nurse Practitioners) who all work together to provide you with the care you need, when you need it.  We recommend signing up for the patient portal called "MyChart".  Sign up information is provided on this After Visit Summary.  MyChart is used to connect with patients for Virtual Visits (Telemedicine).  Patients are able to view lab/test results, encounter notes, upcoming appointments, etc.  Non-urgent messages can be sent to your provider as well.   To learn more about what you can do with MyChart, go to [email or web address].    Your next appointment:   3 month(s)  The format for your next appointment:   In Person  Provider:    [name], DO   Other Instructions  Losartan Tablets What is this medicine? LOSARTAN (loe SAR tan) is an angiotensin II receptor blocker, also known as an ARB. It treats high blood pressure. It can slow kidney damage in some patients. It may also be used to lower the risk of stroke. This medicine may be used for other purposes; ask your health care provider or pharmacist if you have questions. COMMON BRAND NAME(S): Cozaar What should I tell my health care provider before I take this medicine? They need to know if you have any of these conditions:  heart failure  kidney or liver disease  an unusual or allergic reaction to losartan, other medicines, foods, dyes, or preservatives  pregnant or trying to get pregnant  breast-feeding How should I use this medicine? Take this drug by mouth. Take it as directed on the prescription label at the same time every day. You can take it with or without food. If it upsets your stomach, take it with food. Keep taking it unless your health care provider tells you to stop. Talk to your health care provider about the use of this drug in children. While it may be prescribed for children as young as 6 for selected conditions, precautions do apply. Overdosage: If you think you have taken too much of this medicine contact a poison control center or emergency room at once. NOTE: This medicine is only for you. Do not share this medicine with others. What if I  miss a dose? If you miss a dose, take it as soon as you can. If it is almost time for your next dose, take only that dose. Do not take double or extra doses. What may interact with this medicine?  blood pressure medicines  diuretics, especially triamterene, spironolactone, or amiloride  fluconazole  NSAIDs, medicines for pain and inflammation, like ibuprofen or naproxen  potassium salts or potassium supplements  rifampin This list may not describe all possible interactions. Give your health  care provider a list of all the medicines, herbs, non-prescription drugs, or dietary supplements you use. Also tell them if you smoke, drink alcohol, or use illegal drugs. Some items may interact with your medicine. What should I watch for while using this medicine? Visit your doctor or health care professional for regular checks on your progress. Check your blood pressure as directed. Ask your doctor or health care professional what your blood pressure should be and when you should contact him or her. Call your doctor or health care professional if you notice an irregular or fast heart beat. Women should inform their doctor if they wish to become pregnant or think they might be pregnant. There is a potential for serious side effects to an unborn child, particularly in the second or third trimester. Talk to your health care professional or pharmacist for more information. You may get drowsy or dizzy. Do not drive, use machinery, or do anything that needs mental alertness until you know how this drug affects you. Do not stand or sit up quickly, especially if you are an older patient. This reduces the risk of dizzy or fainting spells. Alcohol can make you more drowsy and dizzy. Avoid alcoholic drinks. Avoid salt substitutes unless you are told otherwise by your doctor or health care professional. Do not treat yourself for coughs, colds, or pain while you are taking this medicine without asking your doctor or health care professional for advice. Some ingredients may increase your blood pressure. What side effects may I notice from receiving this medicine? Side effects that you should report to your doctor or health care professional as soon as possible:  confusion, dizziness, light headedness or fainting spells  decreased amount of urine passed  difficulty breathing or swallowing, hoarseness, or tightening of the throat  fast or irregular heart beat, palpitations, or chest pain  skin rash,  itching  swelling of your face, lips, tongue, hands, or feet Side effects that usually do not require medical attention (report to your doctor or health care professional if they continue or are bothersome):  cough  decreased sexual function or desire  headache  nasal congestion or stuffiness  nausea or stomach pain  sore or cramping muscles This list may not describe all possible side effects. Call your doctor for medical advice about side effects. You may report side effects to FDA at 1-800-FDA-1088. Where should I keep my medicine? Keep out of the reach of children and pets. Store at room temperature between 15 and 30 degrees C (59 and 86 degrees F). Protect from light. Keep the container tightly closed. Throw away any unused drug after the expiration date. NOTE: This sheet is a summary. It may not cover all possible information. If you have questions about this medicine, talk to your doctor, pharmacist, or health care provider.  2020 Elsevier/Gold Standard (2019-04-25 12:12:28)

## 2020-08-07 NOTE — Progress Notes (Signed)
CBC normal, Glucose 137, kidney tests normal, liver tests normal, Triglycerides high, watch diet, LDL 100, TSH 0.372 low, add free t4 and free T3 lp

## 2020-08-11 ENCOUNTER — Other Ambulatory Visit: Payer: Medicare HMO

## 2020-08-11 ENCOUNTER — Telehealth: Payer: Self-pay | Admitting: Licensed Clinical Social Worker

## 2020-08-11 ENCOUNTER — Other Ambulatory Visit: Payer: Self-pay

## 2020-08-11 DIAGNOSIS — R7989 Other specified abnormal findings of blood chemistry: Secondary | ICD-10-CM

## 2020-08-11 NOTE — Telephone Encounter (Signed)
CSW referred to assist patient with obtaining a BP cuff. CSW contacted patient to inform cuff will be delivered to home. Patient grateful for support and assistance. CSW available as needed. Jackie Alaiza Yau, LCSW, CCSW-MCS [phone]  

## 2020-08-12 LAB — T4, FREE: Free T4: 1.03 ng/dL (ref 0.82–1.77)

## 2020-08-12 LAB — T3, FREE: T3, Free: 3.3 pg/mL (ref 2.0–4.4)

## 2020-08-12 NOTE — Progress Notes (Signed)
Free t3 and free T4 normal, was MRI head performed yet? lp

## 2020-08-14 ENCOUNTER — Other Ambulatory Visit: Payer: Self-pay

## 2020-08-14 ENCOUNTER — Encounter: Payer: Self-pay | Admitting: Legal Medicine

## 2020-08-14 ENCOUNTER — Ambulatory Visit (INDEPENDENT_AMBULATORY_CARE_PROVIDER_SITE_OTHER): Payer: Medicare HMO | Admitting: Legal Medicine

## 2020-08-14 VITALS — BP 136/70 | HR 67 | Temp 96.9°F | Resp 17 | Ht 59.0 in | Wt 181.0 lb

## 2020-08-14 DIAGNOSIS — R42 Dizziness and giddiness: Secondary | ICD-10-CM | POA: Diagnosis not present

## 2020-08-14 DIAGNOSIS — I6930 Unspecified sequelae of cerebral infarction: Secondary | ICD-10-CM

## 2020-08-14 NOTE — Progress Notes (Addendum)
Subjective:  Patient ID: Tina Perkins, female    DOB: Feb 21, 1941  Age: 79 y.o. MRN: [id number]  Chief Complaint  Patient presents with  . Dizziness    HPI: recurrent vertigo for one year since stroke.  She is getting cardiac workup but her repeat MRI has not been scheduled.  We will reschedule.  She falls to left.  She has not seen her neurologist since stroke. She has lacunar stroke in right cerebellum in 2020. She needs neurology consult. Patient has been doing worse and having more falls since her right cerebellar stroke neurologist did not want a repeat MRI for some reason was not scheduled.  She is having progressive neurologic symptoms with further falls and instability making it difficult for her to live alone.  We plan on perform an MRI to look for further lacunar insults or other causes of her instability and falling..   Current Outpatient Medications on File Prior to Visit  Medication Sig Dispense Refill  . clotrimazole-betamethasone (LOTRISONE) cream Apply 1 application topically 2 (two) times daily. 30 g 0  . losartan (COZAAR) 25 MG tablet Take 1 tablet (25 mg total) by mouth daily. 90 tablet 1  . meclizine (ANTIVERT) 12.5 MG tablet Take 1 tablet (12.5 mg total) by mouth 3 (three) times daily as needed for dizziness. 30 tablet 2  . metoprolol tartrate (LOPRESSOR) 50 MG tablet Take 1 tablet (50 mg total) by mouth once for 1 dose. Please take two hours prior to your cardiac CT 1 tablet 0  . nitroGLYCERIN (NITROSTAT) 0.4 MG SL tablet Place 1 tablet (0.4 mg total) under the tongue every 5 (five) minutes as needed for chest pain. 90 tablet 3   No current facility-administered medications on file prior to visit.   Past Medical History:  Diagnosis Date  . Acute respiratory disease due to COVID-19 virus 08/12/2019  . BMI 35.0-35.9,adult 03/31/2020  . Chest pain, precordial 04/10/2020  . Coronary artery disease involving native coronary artery of native heart without angina pectoris  05/10/2016   Apparently coronary intervention done in 1993 in [place] She was fine to have 75% narrowing of the mid LAD, nitroglycerin was given reduction to 30-40% it was also described as bridging in that area  . Essential hypertension 04/10/2020  . History of hepatitis 12/14/2019  . Mixed hyperlipidemia 12/14/2019  . Obesity (BMI 30-39.9) 04/10/2020  . Patent foramen ovale 12/14/2019  . Pneumonia due to COVID-19 virus 08/12/2019  . Prediabetes 12/28/2019  . Pruritus 12/14/2019  . Sequelae of cerebral infarction 12/14/2019  . Shortness of breath 04/10/2020  . Spinal stenosis, lumbar region with neurogenic claudication 02/28/2019   Formatting of this note might be different from the original. Added automatically from request for surgery [id number] Formatting of this note might be different from the original. Added automatically from request for surgery [id number]   Past Surgical History:  Procedure Laterality Date  . COLON SURGERY    . JOINT REPLACEMENT      Family History  Problem Relation Age of Onset  . Cancer Mother   . Heart attack Mother   . Heart attack Father   . Cancer Father    Social History   Socioeconomic History  . Marital status: Widowed    Spouse name: Not on file  . Number of children: 11  . Years of education: Not on file  . Highest education level: Not on file  Occupational History  . Occupation: Retired  Tobacco Use  . Smoking status: Never Smoker  .  Smokeless tobacco: Never Used  Vaping Use  . Vaping Use: Never used  Substance and Sexual Activity  . Alcohol use: Never  . Drug use: Never  . Sexual activity: Not Currently  Other Topics Concern  . Not on file  Social History Narrative  . Not on file   Social Determinants of Health   Financial Resource Strain:   . Difficulty of Paying Living Expenses: Not on file  Food Insecurity:   . Worried About Programme researcher, broadcasting/film/video in the Last Year: Not on file  . Ran Out of Food in the Last Year: Not on file  Transportation  Needs:   . Lack of Transportation (Medical): Not on file  . Lack of Transportation (Non-Medical): Not on file  Physical Activity:   . Days of Exercise per Week: Not on file  . Minutes of Exercise per Session: Not on file  Stress:   . Feeling of Stress : Not on file  Social Connections:   . Frequency of Communication with Friends and Family: Not on file  . Frequency of Social Gatherings with Friends and Family: Not on file  . Attends Religious Services: Not on file  . Active Member of Clubs or Organizations: Not on file  . Attends Banker Meetings: Not on file  . Marital Status: Not on file    Review of Systems  Constitutional: Negative for activity change and appetite change.  Eyes: Positive for pain (OS).  Respiratory: Negative for cough, choking, shortness of breath and stridor.   Cardiovascular: Negative for chest pain, palpitations and leg swelling.  Endocrine: Negative.   Genitourinary: Negative.   Neurological: Positive for dizziness.     Objective:  BP 136/70 (BP Location: Left Arm, Patient Position: Sitting, Cuff Size: Normal)   Pulse 67   Temp (!) 96.9 F (36.1 C) (Temporal)   Resp 17   Ht 4\' 11"  (1.499 m)   Wt 181 lb (82.1 kg)   SpO2 96%   BMI 36.56 kg/m   BP/Weight 08/14/2020 08/07/2020 07/31/2020  Systolic BP 136 130 126  Diastolic BP 70 88 80  Wt. (Lbs) 181 180.8 181.2  BMI 36.56 36.52 36.6    Physical Exam Vitals reviewed.  Constitutional:      Appearance: Normal appearance.  HENT:     Right Ear: Tympanic membrane, ear canal and external ear normal.     Left Ear: Tympanic membrane, ear canal and external ear normal.  Eyes:     Extraocular Movements: Extraocular movements intact.     Conjunctiva/sclera: Conjunctivae normal.     Pupils: Pupils are equal, round, and reactive to light.     Comments: Nystagmus to right  Cardiovascular:     Pulses: Normal pulses.     Heart sounds: Murmur heard.   Pulmonary:     Effort: Pulmonary  effort is normal. No respiratory distress.     Breath sounds: No wheezing or rales.  Abdominal:     General: Abdomen is flat. Bowel sounds are normal.  Musculoskeletal:     Cervical back: Normal range of motion and neck supple.     Comments: Left sided weakness  Skin:    General: Skin is warm and dry.     Capillary Refill: Capillary refill takes less than 2 seconds.  Neurological:     Mental Status: She is alert and oriented to person, place, and time. Mental status is at baseline.     Cranial Nerves: Cranial nerves are intact.  Sensory: Sensation is intact.     Motor: Weakness (left leg) present. No atrophy, abnormal muscle tone, seizure activity or pronator drift.     Coordination: Coordination abnormal. Finger-Nose-Finger Test and Heel to [facility] Test normal.     Gait: Gait abnormal.     Deep Tendon Reflexes:     Reflex Scores:      Tricep reflexes are 2+ on the right side and 2+ on the left side.      Bicep reflexes are 2+ on the right side and 2+ on the left side.      Brachioradialis reflexes are 2+ on the right side and 2+ on the left side.      Patellar reflexes are 2+ on the right side and 2+ on the left side.      Achilles reflexes are 2+ on the right side and 2+ on the left side.    Comments: Positive rhomberg with fall to left       Lab Results  Component Value Date   WBC 8.8 07/31/2020   HGB 14.5 07/31/2020   HCT 43.7 07/31/2020   PLT 252 07/31/2020   GLUCOSE 137 (H) 07/31/2020   CHOL 173 07/31/2020   TRIG 154 (H) 07/31/2020   HDL 46 07/31/2020   LDLCALC 100 (H) 07/31/2020   ALT 23 07/31/2020   AST 20 07/31/2020   NA 140 07/31/2020   K 4.7 07/31/2020   CL 105 07/31/2020   CREATININE 0.67 07/31/2020   BUN 14 07/31/2020   CO2 21 07/31/2020   TSH 0.372 (L) 07/31/2020   HGBA1C 6.5 (H) 07/31/2020      Assessment & Plan:   1. Vertigo - MR Brain W Wo Contrast Patient continues to have vertiginous spells and falling to left.  Needs neurology consult.   Patient is showing progressive neurologic deficits after her lacunar stroke in 2020.  She is having progressive differential difficulty moving care of herself at home. neurologist recommended an MRI for further evaluation. 2. Sequelae of cerebral infarction Patient had a lacunar stoke which may be the cause of her gait instability      Orders Placed This Encounter  Procedures  . MR Brain W Wo Contrast     Follow-up: No follow-ups on file.  An After Visit Summary was printed and given to the patient.  Brent Bulla [facility] [phone]

## 2020-08-17 NOTE — Progress Notes (Signed)
Mri of head should be scheduled for pituitary tumor lp

## 2020-08-25 ENCOUNTER — Encounter: Payer: Self-pay | Admitting: Legal Medicine

## 2020-08-25 ENCOUNTER — Other Ambulatory Visit: Payer: Self-pay | Admitting: Legal Medicine

## 2020-09-03 ENCOUNTER — Encounter: Payer: Self-pay | Admitting: Legal Medicine

## 2020-09-05 ENCOUNTER — Telehealth: Payer: Self-pay

## 2020-09-05 NOTE — Telephone Encounter (Signed)
Called the patient to set up an earlier appointment with Dr. Servando Salina. Left a message to call back.

## 2020-09-05 NOTE — Telephone Encounter (Signed)
-----   Message from [name], DO sent at 09/05/2020 10:25 AM EST ----- The patient coming for [place] to talk about her monitor results.  A virtual visit will be okay

## 2020-09-08 ENCOUNTER — Telehealth: Payer: Self-pay

## 2020-09-08 NOTE — Telephone Encounter (Signed)
-----   Message from Kardie Tobb, DO sent at 09/05/2020 10:25 AM EST ----- The patient coming for us to talk about her monitor results.  A virtual visit will be okay 

## 2020-09-08 NOTE — Telephone Encounter (Signed)
Left message on patients voicemail to please return our call.   

## 2020-09-09 ENCOUNTER — Telehealth: Payer: Self-pay

## 2020-09-09 NOTE — Telephone Encounter (Signed)
Left message on patients voicemail to please return our call.   

## 2020-09-09 NOTE — Telephone Encounter (Signed)
-----   Message from Kardie Tobb, DO sent at 09/05/2020 10:25 AM EST ----- The patient coming for us to talk about her monitor results.  A virtual visit will be okay 

## 2020-09-10 ENCOUNTER — Telehealth: Payer: Self-pay

## 2020-09-10 NOTE — Telephone Encounter (Signed)
-----   Message from Kardie Tobb, DO sent at 09/05/2020 10:25 AM EST ----- The patient coming for us to talk about her monitor results.  A virtual visit will be okay 

## 2020-09-10 NOTE — Telephone Encounter (Signed)
Left message on patients voicemail to please return our call.   

## 2020-09-11 NOTE — Telephone Encounter (Signed)
Left message on patients voicemail to please return our call.   

## 2020-09-11 NOTE — Telephone Encounter (Signed)
Spoke with patient regarding results and recommendation.  Patient verbalizes understanding and is agreeable to plan of care. Advised patient to call back with any issues or concerns.  

## 2020-09-11 NOTE — Telephone Encounter (Signed)
Pt is returning call.  

## 2020-09-15 ENCOUNTER — Ambulatory Visit: Payer: Medicare HMO | Admitting: Cardiology

## 2020-09-15 ENCOUNTER — Encounter: Payer: Self-pay | Admitting: Cardiology

## 2020-09-15 ENCOUNTER — Other Ambulatory Visit: Payer: Self-pay

## 2020-09-15 VITALS — BP 122/82 | HR 60 | Ht 59.0 in | Wt 177.8 lb

## 2020-09-15 DIAGNOSIS — I2510 Atherosclerotic heart disease of native coronary artery without angina pectoris: Secondary | ICD-10-CM

## 2020-09-15 DIAGNOSIS — R400 Somnolence: Secondary | ICD-10-CM

## 2020-09-15 DIAGNOSIS — E669 Obesity, unspecified: Secondary | ICD-10-CM | POA: Diagnosis not present

## 2020-09-15 DIAGNOSIS — I471 Supraventricular tachycardia: Secondary | ICD-10-CM | POA: Insufficient documentation

## 2020-09-15 DIAGNOSIS — I441 Atrioventricular block, second degree: Secondary | ICD-10-CM

## 2020-09-15 DIAGNOSIS — R0683 Snoring: Secondary | ICD-10-CM

## 2020-09-15 DIAGNOSIS — E8881 Metabolic syndrome: Secondary | ICD-10-CM

## 2020-09-15 DIAGNOSIS — I4719 Other supraventricular tachycardia: Secondary | ICD-10-CM

## 2020-09-15 DIAGNOSIS — I10 Essential (primary) hypertension: Secondary | ICD-10-CM | POA: Diagnosis not present

## 2020-09-15 DIAGNOSIS — R5383 Other fatigue: Secondary | ICD-10-CM

## 2020-09-15 HISTORY — DX: Atrioventricular block, second degree: I44.1

## 2020-09-15 HISTORY — DX: Snoring: R06.83

## 2020-09-15 HISTORY — DX: Somnolence: R40.0

## 2020-09-15 MED ORDER — METOPROLOL SUCCINATE ER 25 MG PO TB24: 12.5000 mg | ORAL_TABLET | Freq: Every day | ORAL | 2 refills | Status: DC

## 2020-09-15 NOTE — Addendum Note (Signed)
Addended by: Eleonore Chiquito on: 09/15/2020 11:20 AM   Modules accepted: Orders

## 2020-09-15 NOTE — Progress Notes (Signed)
Cardiology Office Note:    Date:  09/15/2020   ID:  Tina Perkins, DOB 1940-10-22, MRN [id number]  PCP:  [name], MD  Cardiologist:  [name], DO  Electrophysiologist:  None   Referring MD: [name],*   " I am still experiencing palpitations"  History of Present Illness:    Tina Perkins is a 79 y.o. female with a hx of minimal coronary artery disease by coronary CTA, obesity, hyperlipidemia, prediabetes, history of COVID-19 pneumonia is here today for follow-up visit.  Did see the patient in November 2021 at that time we discussed her CT scan results.  She had also complained about lightheadedness and dizziness I placed a monitor on the patient.  We will stop amlodipine and start the patient on losartan for antihypertensive regimen especially in the setting of her dizziness.  She tells me that the dizziness has improved some.  But the palpitations are still there.  She is here today for [place] to discuss her monitor results.  She offers no other complaints.  Past Medical History:  Diagnosis Date  . Acute respiratory disease due to COVID-19 virus 08/12/2019  . BMI 35.0-35.9,adult 03/31/2020  . Chest pain, precordial 04/10/2020  . Coronary artery disease involving native coronary artery of native heart without angina pectoris 05/10/2016   Apparently coronary intervention done in 1993 in [place] She was fine to have 75% narrowing of the mid LAD, nitroglycerin was given reduction to 30-40% it was also described as bridging in that area  . Essential hypertension 04/10/2020  . History of hepatitis 12/14/2019  . Mixed hyperlipidemia 12/14/2019  . Obesity (BMI 30-39.9) 04/10/2020  . Patent foramen ovale 12/14/2019  . Pneumonia due to COVID-19 virus 08/12/2019  . Prediabetes 12/28/2019  . Pruritus 12/14/2019  . Sequelae of cerebral infarction 12/14/2019  . Shortness of breath 04/10/2020  . Spinal stenosis, lumbar region with neurogenic claudication 02/28/2019   Formatting of  this note might be different from the original. Added automatically from request for surgery [id number] Formatting of this note might be different from the original. Added automatically from request for surgery [id number]    Past Surgical History:  Procedure Laterality Date  . COLON SURGERY    . JOINT REPLACEMENT      Current Medications: Current Meds  Medication Sig  . clotrimazole-betamethasone (LOTRISONE) cream Apply 1 application topically 2 (two) times daily.  Marland Kitchen losartan (COZAAR) 25 MG tablet Take 1 tablet (25 mg total) by mouth daily.  . meclizine (ANTIVERT) 12.5 MG tablet Take 1 tablet (12.5 mg total) by mouth 3 (three) times daily as needed for dizziness.     Allergies:   Sulfa antibiotics   Social History   Socioeconomic History  . Marital status: Widowed    Spouse name: Not on file  . Number of children: 11  . Years of education: Not on file  . Highest education level: Not on file  Occupational History  . Occupation: Retired  Tobacco Use  . Smoking status: Never Smoker  . Smokeless tobacco: Never Used  Vaping Use  . Vaping Use: Never used  Substance and Sexual Activity  . Alcohol use: Never  . Drug use: Never  . Sexual activity: Not Currently  Other Topics Concern  . Not on file  Social History Narrative  . Not on file   Social Determinants of Health   Financial Resource Strain: Not on file  Food Insecurity: Not on file  Transportation Needs: Not on file  Physical Activity: Not on  file  Stress: Not on file  Social Connections: Not on file     Family History: The patient's family history includes Cancer in her father and mother; Heart attack in her father and mother.  ROS:   Review of Systems  Constitution: Negative for decreased appetite, fever and weight gain.  HENT: Negative for congestion, ear discharge, hoarse voice and sore throat.   Eyes: Negative for discharge, redness, vision loss in right eye and visual halos.  Cardiovascular: Negative for chest  pain, dyspnea on exertion, leg swelling, orthopnea and palpitations.  Respiratory: Negative for cough, hemoptysis, shortness of breath and snoring.   Endocrine: Negative for heat intolerance and polyphagia.  Hematologic/Lymphatic: Negative for bleeding problem. Does not bruise/bleed easily.  Skin: Negative for flushing, nail changes, rash and suspicious lesions.  Musculoskeletal: Negative for arthritis, joint pain, muscle cramps, myalgias, neck pain and stiffness.  Gastrointestinal: Negative for abdominal pain, bowel incontinence, diarrhea and excessive appetite.  Genitourinary: Negative for decreased libido, genital sores and incomplete emptying.  Neurological: Negative for brief paralysis, focal weakness, headaches and loss of balance.  Psychiatric/Behavioral: Negative for altered mental status, depression and suicidal ideas.  Allergic/Immunologic: Negative for HIV exposure and persistent infections.    EKGs/Labs/Other Studies Reviewed:    The following studies were reviewed today:   EKG: None today.   ZIO Monitor:  The patient wore the monitor for 14 days starting August 07, 2020. Indication: Palpitations  The minimum heart rate was 39 bpm, maximum heart rate was 167 bpm, and average heart rate was 77 bpm. Predominant underlying rhythm was Sinus Rhythm. Second Degree AV Block-Mobitz I (Wenckebach) was present ( August 20, 2020 at 11:23 PM).  39 Supraventricular Tachycardia runs occurred, the run with the fastest interval lasting 8 beats with a maximum rate of 167 bpm, the longest lasting 12 beats with an average rate of 132 bpm.  Premature atrial complexes were rare. Premature Ventricular complexes were rare.  No ventricular tachycardia, no pauses, No AV block and no atrial fibrillation present.  25 patient triggered events 10 associated with premature ventricular complex, 6 associated with premature atrial complex remaining associated with sinus rhythm. 10 diary events  noted associated with premature atrial complex in sinus rhythm.   Conclusion: This study is remarkable for paroxysmal atrial tachycardia with variable block, and second degree AV block Mobitz type I which occur during the overnight hours.   TTE IMPRESSIONS  1. Left ventricular ejection fraction, by estimation, is 60 to 65%. The left ventricle has normal function. The left ventricle has no regional wall motion abnormalities. There is mild concentric left ventricular  hypertrophy. Left ventricular diastolic  parameters are consistent with Grade II diastolic dysfunction (pseudonormalization). Elevated left atrial pressure.  2. Right ventricular systolic function is normal. The right ventricular size is normal. There is normal pulmonary artery systolic pressure.  3. The mitral valve is normal in structure. No evidence of mitral valve regurgitation. No evidence of mitral stenosis.  4. The aortic valve is normal in structure. Aortic valve regurgitation is mild. Mild aortic valve sclerosis is present, with no evidence of aortic  valve stenosis.  5. The inferior vena cava is normal in size with greater than 50% respiratory variability, suggesting right atrial pressure of 3 mmHg.    CCTA IMPRESSION: 1. Coronary calcium score of 12. This was 23 percentile for age and sex matched control.  2. Normal coronary origin with right dominance.  3. Minimal Coronary artery disease. CADRAD 1. Recommend medical therapy.  Recent Labs:  07/31/2020: ALT 23; BUN 14; Creatinine, Ser 0.67; Hemoglobin 14.5; Platelets 252; Potassium 4.7; Sodium 140; TSH 0.372  Recent Lipid Panel    Component Value Date/Time   CHOL 173 07/31/2020 1020   TRIG 154 (H) 07/31/2020 1020   HDL 46 07/31/2020 1020   CHOLHDL 3.8 07/31/2020 1020   LDLCALC 100 (H) 07/31/2020 1020    Physical Exam:    VS:  BP 122/82   Pulse 60   Ht 4\' 11"  (1.499 m)   Wt 177 lb 12.8 oz (80.6 kg)   SpO2 95%   BMI 35.91 kg/m     Wt  Readings from Last 3 Encounters:  09/15/20 177 lb 12.8 oz (80.6 kg)  08/14/20 181 lb (82.1 kg)  08/07/20 180 lb 12.8 oz (82 kg)     GEN: Well nourished, well developed in no acute distress HEENT: Normal NECK: No JVD; No carotid bruits LYMPHATICS: No lymphadenopathy CARDIAC: S1S2 noted,RRR, no murmurs, rubs, gallops RESPIRATORY:  Clear to auscultation without rales, wheezing or rhonchi  ABDOMEN: Soft, non-tender, non-distended, +bowel sounds, no guarding. EXTREMITIES: No edema, No cyanosis, no clubbing MUSCULOSKELETAL:  No deformity  SKIN: Warm and dry NEUROLOGIC:  Alert and oriented x 3, non-focal PSYCHIATRIC:  Normal affect, good insight  ASSESSMENT:    1. Coronary artery disease involving native coronary artery of native heart without angina pectoris   2. Essential hypertension   3. Obesity (BMI 30-39.9)   4. PAT (paroxysmal atrial tachycardia) (HCC)   5. Snoring   6. Daytime somnolence   7. Second degree AV block, Mobitz type I    PLAN:     We discussed her monitor results which show evidence of paroxysmal atrial tachycardia with variable block.  In addition she had second-degree AV block Mobitz 1 which occurred during the overnight hours when she was sleeping.  At this time she tells me the palpitations are more of a problem for her so we will like to do started patient on low-dose beta-blocker 12.5 mg metoprolol succinate.  In addition she reports that she is got significant daytime somnolence as well as snoring and fatigue.  It with her evidence of second-degree type I AV block during the overnight hours when she is sleeping I am suspecting strongly that the patient may have sleep apnea.  At this time I am going to refer the patient for sleep study.  Again vitamin D level for her fatigue in the setting of her metabolic syndrome.  Her blood pressure is acceptable no changes will be made to her current antihypertensive regimen.  The patient understands the need to lose  weight with diet and exercise. We have discussed specific strategies for this.  Minimal coronary artery disease no anginal symptoms.  The patient is in agreement with the above plan. The patient left the office in stable condition.  The patient will follow up in   Medication Adjustments/Labs and Tests Ordered: Current medicines are reviewed at length with the patient today.  Concerns regarding medicines are outlined above.  No orders of the defined types were placed in this encounter.  No orders of the defined types were placed in this encounter.   There are no Patient Instructions on file for this visit.   Adopting a Healthy Lifestyle.  Know what a healthy weight is for you (roughly BMI <25) and aim to maintain this   Aim for 7+ servings of fruits and vegetables daily   65-80+ fluid ounces of water or unsweet tea for healthy kidneys  Limit to max 1 drink of alcohol per day; avoid smoking/tobacco   Limit animal fats in diet for cholesterol and heart health - choose grass fed whenever available   Avoid highly processed foods, and foods high in saturated/trans fats   Aim for low stress - take time to unwind and care for your mental health   Aim for 150 min of moderate intensity exercise weekly for heart health, and weights twice weekly for bone health   Aim for 7-9 hours of sleep daily   When it comes to diets, agreement about the perfect plan isnt easy to find, even among the experts. Experts at the [employer] developed an idea known as the Healthy Eating Plate. Just imagine a plate divided into logical, healthy portions.   The emphasis is on diet quality:   Load up on vegetables and fruits - one-half of your plate: Aim for color and variety, and remember that potatoes dont count.   Go for whole grains - one-quarter of your plate: Whole wheat, barley, wheat berries, quinoa, oats, brown rice, and foods made with them. If you want pasta, go with whole  wheat pasta.   Protein power - one-quarter of your plate: Fish, chicken, beans, and nuts are all healthy, versatile protein sources. Limit red meat.   The diet, however, does go beyond the plate, offering a few other suggestions.   Use healthy plant oils, such as olive, canola, soy, corn, sunflower and peanut. Check the labels, and avoid partially hydrogenated oil, which have unhealthy trans fats.   If youre thirsty, drink water. Coffee and tea are good in moderation, but skip sugary drinks and limit milk and dairy products to one or two daily servings.   The type of carbohydrate in the diet is more important than the amount. Some sources of carbohydrates, such as vegetables, fruits, whole grains, and beans-are healthier than others.   Finally, stay active  Signed, Thomasene RippleKardie Bladimir Auman, DO  09/15/2020 10:53 AM    [facility]

## 2020-09-15 NOTE — Patient Instructions (Signed)
Medication Instructions:  Your physician has recommended you make the following change in your medication:   Start Toprol XL 12.5 mg daily.  *If you need a refill on your cardiac medications before your next appointment, please call your pharmacy*   Lab Work: Your physician recommends that you have Vitamin D level checked today.  If you have labs (blood work) drawn today and your tests are completely normal, you will receive your results only by: Marland Kitchen MyChart Message (if you have MyChart) OR . A paper copy in the mail If you have any lab test that is abnormal or we need to change your treatment, we will call you to review the results.   Testing/Procedures: Your physician has recommended that you have a sleep study. This test records several body functions during sleep, including: brain activity, eye movement, oxygen and carbon dioxide blood levels, heart rate and rhythm, breathing rate and rhythm, the flow of air through your mouth and nose, snoring, body muscle movements, and chest and belly movement.     Follow-Up: At [facility], you and your health needs are our priority.  As part of our continuing mission to provide you with exceptional heart care, we have created designated Provider Care Teams.  These Care Teams include your primary Cardiologist (physician) and Advanced Practice Providers (APPs -  Physician Assistants and Nurse Practitioners) who all work together to provide you with the care you need, when you need it.  We recommend signing up for the patient portal called "MyChart".  Sign up information is provided on this After Visit Summary.  MyChart is used to connect with patients for Virtual Visits (Telemedicine).  Patients are able to view lab/test results, encounter notes, upcoming appointments, etc.  Non-urgent messages can be sent to your provider as well.   To learn more about what you can do with MyChart, go to [email or web address].    Your next appointment:   3  month(s)  The format for your next appointment:   In Person  Provider:   [name], DO   Other Instructions Metoprolol Extended-Release Tablets What is this medicine? METOPROLOL (me TOE proe lole) is a beta blocker. It decreases the amount of work your heart has to do and helps your heart beat regularly. It treats high blood pressure and/or prevent chest pain (also called angina). It also treats heart failure. This medicine may be used for other purposes; ask your health care provider or pharmacist if you have questions. COMMON BRAND NAME(S): toprol, Toprol XL What should I tell my health care provider before I take this medicine? They need to know if you have any of these conditions:  diabetes  heart or vessel disease like slow heart rate, worsening heart failure, heart block, sick sinus syndrome or Raynaud's disease  kidney disease  liver disease  lung or breathing disease, like asthma or emphysema  pheochromocytoma  thyroid disease  an unusual or allergic reaction to metoprolol, other beta-blockers, medicines, foods, dyes, or preservatives  pregnant or trying to get pregnant  breast-feeding How should I use this medicine? Take this drug by mouth. Take it as directed on the prescription label at the same time every day. Take it with food. You may cut the tablet in half if it is scored (has a line in the middle of it). This may help you swallow the tablet if the whole tablet is too big. Be sure to take both halves. Do not take just one-half of the tablet. Keep taking it unless  your health care provider tells you to stop. Talk to your health care provider about the use of this drug in children. While it may be prescribed for children as young as 6 for selected conditions, precautions do apply. Overdosage: If you think you have taken too much of this medicine contact a poison control center or emergency room at once. NOTE: This medicine is only for you. Do not share this  medicine with others. What if I miss a dose? If you miss a dose, take it as soon as you can. If it is almost time for your next dose, take only that dose. Do not take double or extra doses. What may interact with this medicine? This medicine may interact with the following medications:  certain medicines for blood pressure, heart disease, irregular heart beat  certain medicines for depression, like monoamine oxidase (MAO) inhibitors, fluoxetine, or paroxetine  clonidine  dobutamine  epinephrine  isoproterenol  reserpine This list may not describe all possible interactions. Give your health care provider a list of all the medicines, herbs, non-prescription drugs, or dietary supplements you use. Also tell them if you smoke, drink alcohol, or use illegal drugs. Some items may interact with your medicine. What should I watch for while using this medicine? Visit your doctor or health care professional for regular check ups. Contact your doctor right away if your symptoms worsen. Check your blood pressure and pulse rate regularly. Ask your health care professional what your blood pressure and pulse rate should be, and when you should contact them. You may get drowsy or dizzy. Do not drive, use machinery, or do anything that needs mental alertness until you know how this medicine affects you. Do not sit or stand up quickly, especially if you are an older patient. This reduces the risk of dizzy or fainting spells. Contact your doctor if these symptoms continue. Alcohol may interfere with the effect of this medicine. Avoid alcoholic drinks. This medicine may increase blood sugar. Ask your healthcare provider if changes in diet or medicines are needed if you have diabetes. What side effects may I notice from receiving this medicine? Side effects that you should report to your doctor or health care professional as soon as possible:  allergic reactions like skin rash, itching or hives  cold or numb  hands or feet  depression  difficulty breathing  faint  fever with sore throat  irregular heartbeat, chest pain  rapid weight gain   signs and symptoms of high blood sugar such as being more thirsty or hungry or having to urinate more than normal. You may also feel very tired or have blurry vision.  swollen legs or ankles Side effects that usually do not require medical attention (report to your doctor or health care professional if they continue or are bothersome):  anxiety or nervousness  change in sex drive or performance  dry skin  headache  nightmares or trouble sleeping  short term memory loss  stomach upset or diarrhea This list may not describe all possible side effects. Call your doctor for medical advice about side effects. You may report side effects to FDA at 1-800-FDA-1088. Where should I keep my medicine? Keep out of the reach of children and pets. Store at room temperature between 20 and 25 degrees C (68 and 77 degrees F). Throw away any unused drug after the expiration date. NOTE: This sheet is a summary. It may not cover all possible information. If you have questions about this medicine, talk to  your doctor, pharmacist, or health care provider.  2020 Elsevier/Gold Standard (2019-05-03 18:23:00)

## 2020-09-16 ENCOUNTER — Telehealth: Payer: Self-pay

## 2020-09-16 LAB — VITAMIN D 25 HYDROXY (VIT D DEFICIENCY, FRACTURES): Vit D, 25-Hydroxy: 19.9 ng/mL — ABNORMAL LOW (ref 30.0–100.0)

## 2020-09-16 NOTE — Telephone Encounter (Signed)
Left message on patients voicemail to please return our call.   

## 2020-09-16 NOTE — Telephone Encounter (Signed)
-----   Message from Kardie Tobb, DO sent at 09/16/2020  9:28 AM EST ----- Your lab work showed vitamin D deficiency.  Like to start you on vitamin D 50,000 units weekly for 12 weeks. 

## 2020-09-17 ENCOUNTER — Telehealth: Payer: Self-pay

## 2020-09-17 MED ORDER — VITAMIN D (ERGOCALCIFEROL) 1.25 MG (50000 UNIT) PO CAPS
50000.0000 [IU] | ORAL_CAPSULE | ORAL | 0 refills | Status: DC
Start: 2020-09-17 — End: 2021-01-07

## 2020-09-17 NOTE — Telephone Encounter (Signed)
Spoke with patient regarding results and recommendation.  Patient verbalizes understanding and is agreeable to plan of care. Advised patient to call back with any issues or concerns.  

## 2020-09-17 NOTE — Telephone Encounter (Signed)
-----   Message from Kardie Tobb, DO sent at 09/16/2020  9:28 AM EST ----- Your lab work showed vitamin D deficiency.  Like to start you on vitamin D 50,000 units weekly for 12 weeks. 

## 2020-09-17 NOTE — Telephone Encounter (Signed)
Left message on patients voicemail to please return our call.   

## 2020-09-17 NOTE — Telephone Encounter (Signed)
-----   Message from [name], DO sent at 09/16/2020  9:28 AM EST ----- Your lab work showed vitamin D deficiency.  Like to start you on vitamin D 50,000 units weekly for 12 weeks.

## 2020-11-05 ENCOUNTER — Ambulatory Visit: Payer: Medicare HMO | Admitting: Diagnostic Neuroimaging

## 2020-11-05 ENCOUNTER — Encounter: Payer: Self-pay | Admitting: Diagnostic Neuroimaging

## 2020-11-05 ENCOUNTER — Telehealth: Payer: Self-pay | Admitting: *Deleted

## 2020-11-05 NOTE — Telephone Encounter (Signed)
Patient was no show for new patient appointment today. 

## 2020-11-12 ENCOUNTER — Ambulatory Visit: Payer: Medicare HMO | Admitting: Cardiology

## 2020-12-15 ENCOUNTER — Other Ambulatory Visit: Payer: Self-pay

## 2020-12-18 ENCOUNTER — Other Ambulatory Visit: Payer: Self-pay

## 2020-12-18 ENCOUNTER — Ambulatory Visit (INDEPENDENT_AMBULATORY_CARE_PROVIDER_SITE_OTHER): Payer: Medicare HMO | Admitting: Cardiology

## 2020-12-18 ENCOUNTER — Encounter: Payer: Self-pay | Admitting: Cardiology

## 2020-12-18 VITALS — BP 130/74 | HR 73 | Ht 59.0 in | Wt 185.0 lb

## 2020-12-18 DIAGNOSIS — E669 Obesity, unspecified: Secondary | ICD-10-CM

## 2020-12-18 DIAGNOSIS — E782 Mixed hyperlipidemia: Secondary | ICD-10-CM | POA: Diagnosis not present

## 2020-12-18 DIAGNOSIS — R7303 Prediabetes: Secondary | ICD-10-CM

## 2020-12-18 DIAGNOSIS — I471 Supraventricular tachycardia: Secondary | ICD-10-CM

## 2020-12-18 DIAGNOSIS — I2510 Atherosclerotic heart disease of native coronary artery without angina pectoris: Secondary | ICD-10-CM | POA: Diagnosis not present

## 2020-12-18 DIAGNOSIS — I10 Essential (primary) hypertension: Secondary | ICD-10-CM

## 2020-12-18 NOTE — Progress Notes (Signed)
Cardiology Office Note:    Date:  12/18/2020   ID:  Tina Perkins, DOB 14-Jun-1941, MRN [id number]  PCP:  [name], MD  Cardiologist:  [name], DO  Electrophysiologist:  None   Referring MD: [name],*   Chief Complaint  Patient presents with  . Follow-up    History of Present Illness:    Tina Perkins is a 80 y.o. female with a hx of hx of minimal coronary artery disease by coronary CTA, obesity, hyperlipidemia, prediabetes, history of COVID-19 pneumonia is here today for follow-up visit.    I saw the patient on September 15, 2020 at that time I started her on low-dose beta-blocker to help with her paroxysmal atrial tachycardia.  Unfortunately she has not been taking this medicine.  She still is complaining about palpitation and chest tightness.  Past Medical History:  Diagnosis Date  . Acute respiratory disease due to COVID-19 virus 08/12/2019  . BMI 35.0-35.9,adult 03/31/2020  . Chest pain, precordial 04/10/2020  . Coronary artery disease involving native coronary artery of native heart without angina pectoris 05/10/2016   Apparently coronary intervention done in 1993 in [place] She was fine to have 75% narrowing of the mid LAD, nitroglycerin was given reduction to 30-40% it was also described as bridging in that area  . Daytime somnolence 09/15/2020  . Essential hypertension 04/10/2020  . History of hepatitis 12/14/2019  . Mixed hyperlipidemia 12/14/2019  . Obesity (BMI 30-39.9) 04/10/2020  . Patent foramen ovale 12/14/2019  . Pneumonia due to COVID-19 virus 08/12/2019  . Prediabetes 12/28/2019  . Pruritus 12/14/2019  . Second degree AV block, Mobitz type I 09/15/2020  . Sequelae of cerebral infarction 12/14/2019  . Shortness of breath 04/10/2020  . Snoring 09/15/2020  . Spinal stenosis, lumbar region with neurogenic claudication 02/28/2019   Formatting of this note might be different from the original. Added automatically from request for surgery [id number]  Formatting of this note might be different from the original. Added automatically from request for surgery [id number]    Past Surgical History:  Procedure Laterality Date  . COLON SURGERY    . JOINT REPLACEMENT      Current Medications: Current Meds  Medication Sig  . amLODipine (NORVASC) 2.5 MG tablet Take 2.5 mg by mouth daily.  . clotrimazole-betamethasone (LOTRISONE) cream Apply 1 application topically 2 (two) times daily.  . meclizine (ANTIVERT) 12.5 MG tablet Take 1 tablet (12.5 mg total) by mouth 3 (three) times daily as needed for dizziness.  . nitroGLYCERIN (NITROSTAT) 0.4 MG SL tablet Place 1 tablet (0.4 mg total) under the tongue every 5 (five) minutes as needed for chest pain.  . Vitamin D, Ergocalciferol, (DRISDOL) 1.25 MG (50000 UNIT) CAPS capsule Take 1 capsule (50,000 Units total) by mouth every 7 (seven) days.  . [DISCONTINUED] losartan (COZAAR) 25 MG tablet Take 1 tablet (25 mg total) by mouth daily.     Allergies:   Sulfa antibiotics   Social History   Socioeconomic History  . Marital status: Widowed    Spouse name: Not on file  . Number of children: 11  . Years of education: Not on file  . Highest education level: Not on file  Occupational History  . Occupation: Retired  Tobacco Use  . Smoking status: Never Smoker  . Smokeless tobacco: Never Used  Vaping Use  . Vaping Use: Never used  Substance and Sexual Activity  . Alcohol use: Never  . Drug use: Never  . Sexual activity: Not Currently  Other  Topics Concern  . Not on file  Social History Narrative  . Not on file   Social Determinants of Health   Financial Resource Strain: Not on file  Food Insecurity: Not on file  Transportation Needs: Not on file  Physical Activity: Not on file  Stress: Not on file  Social Connections: Not on file     Family History: The patient's family history includes Cancer in her father and mother; Heart attack in her father and mother.  ROS:   Review of Systems   Constitution: Negative for decreased appetite, fever and weight gain.  HENT: Negative for congestion, ear discharge, hoarse voice and sore throat.   Eyes: Negative for discharge, redness, vision loss in right eye and visual halos.  Cardiovascular: Negative for chest pain, dyspnea on exertion, leg swelling, orthopnea and palpitations.  Respiratory: Negative for cough, hemoptysis, shortness of breath and snoring.   Endocrine: Negative for heat intolerance and polyphagia.  Hematologic/Lymphatic: Negative for bleeding problem. Does not bruise/bleed easily.  Skin: Negative for flushing, nail changes, rash and suspicious lesions.  Musculoskeletal: Negative for arthritis, joint pain, muscle cramps, myalgias, neck pain and stiffness.  Gastrointestinal: Negative for abdominal pain, bowel incontinence, diarrhea and excessive appetite.  Genitourinary: Negative for decreased libido, genital sores and incomplete emptying.  Neurological: Negative for brief paralysis, focal weakness, headaches and loss of balance.  Psychiatric/Behavioral: Negative for altered mental status, depression and suicidal ideas.  Allergic/Immunologic: Negative for HIV exposure and persistent infections.    EKGs/Labs/Other Studies Reviewed:    The following studies were reviewed today:   EKG:  The ekg ordered today demonstrates sinus rhythm, heart rate 71 bpm.  ZIO Monitor:  The patient wore the monitor for 14 days starting August 07, 2020. Indication: Palpitations  The minimum heart rate was 39 bpm, maximum heart rate was 167 bpm, and average heart rate was 77 bpm. Predominant underlying rhythm was Sinus Rhythm. Second Degree AV Block-Mobitz I (Wenckebach) was present ( August 20, 2020 at 11:23 PM).  39 Supraventricular Tachycardia runs occurred, the run with the fastest interval lasting 8 beats with a maximum rate of 167 bpm, the longest lasting 12 beats with an average rate of 132 bpm.  Premature atrial complexes  were rare. Premature Ventricular complexeswere rare.  No ventricular tachycardia, no pauses, No AV block and no atrial fibrillation present.  25 patient triggered events 10 associated with premature ventricular complex, 6 associated with premature atrial complex remaining associated with sinus rhythm. 10 diary events noted associated with premature atrial complex in sinus rhythm.   Conclusion: This study is remarkable for paroxysmal atrial tachycardia with variable block, and second degree AV block Mobitz type I which occur during the overnight hours.   TTEIMPRESSIONS  1. Left ventricular ejection fraction, by estimation, is 60 to 65%. The left ventricle has normal function. The left ventricle has no regional wall motion abnormalities. There is mild concentric left ventricular  hypertrophy. Left ventricular diastolic  parameters are consistent with Grade II diastolic dysfunction (pseudonormalization). Elevated left atrial pressure.  2. Right ventricular systolic function is normal. The right ventricular size is normal. There is normal pulmonary artery systolic pressure.  3. The mitral valve is normal in structure. No evidence of mitral valve regurgitation. No evidence of mitral stenosis.  4. The aortic valve is normal in structure. Aortic valve regurgitation is mild. Mild aortic valve sclerosis is present, with no evidence of aortic  valve stenosis.  5. The inferior vena cava is normal in size with greater  than 50% respiratory variability, suggesting right atrial pressure of 3 mmHg.   CCTAIMPRESSION: 1. Coronary calcium score of 12. This was 23 percentile for age and sex matched control.  2. Normal coronary origin with right dominance.  3. Minimal Coronary artery disease. CADRAD 1. Recommend medical therapy.  Recent Labs: 07/31/2020: ALT 23; BUN 14; Creatinine, Ser 0.67; Hemoglobin 14.5; Platelets 252; Potassium 4.7; Sodium 140; TSH 0.372  Recent Lipid Panel     Component Value Date/Time   CHOL 173 07/31/2020 1020   TRIG 154 (H) 07/31/2020 1020   HDL 46 07/31/2020 1020   CHOLHDL 3.8 07/31/2020 1020   LDLCALC 100 (H) 07/31/2020 1020    Physical Exam:    VS:  BP 130/74 (BP Location: Right Arm, Patient Position: Sitting)   Pulse 73   Ht 4\' 11"  (1.499 m)   Wt 185 lb (83.9 kg)   SpO2 94%   BMI 37.37 kg/m     Wt Readings from Last 3 Encounters:  12/18/20 185 lb (83.9 kg)  09/15/20 177 lb 12.8 oz (80.6 kg)  08/14/20 181 lb (82.1 kg)     GEN: Well nourished, well developed in no acute distress HEENT: Normal NECK: No JVD; No carotid bruits LYMPHATICS: No lymphadenopathy CARDIAC: S1S2 noted,RRR, no murmurs, rubs, gallops RESPIRATORY:  Clear to auscultation without rales, wheezing or rhonchi  ABDOMEN: Soft, non-tender, non-distended, +bowel sounds, no guarding. EXTREMITIES: No edema, No cyanosis, no clubbing MUSCULOSKELETAL:  No deformity  SKIN: Warm and dry NEUROLOGIC:  Alert and oriented x 3, non-focal PSYCHIATRIC:  Normal affect, good insight  ASSESSMENT:    1. PAT (paroxysmal atrial tachycardia) (HCC)   2. Coronary artery disease involving native coronary artery of native heart without angina pectoris   3. Essential hypertension   4. Mixed hyperlipidemia   5. Prediabetes   6. Obesity (BMI 30-39.9)    PLAN:    She still is experiencing symptoms of palpitation she has not started on the Toprol-XL 12.5 mg daily.  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  Blood pressure is acceptable, continue with current antihypertensive regimen.  No angina symptoms at this time.  Prediabetes-this is being managed by his primary care doctor.  No adjustments for antidiabetic medications were made today.  The patient understands the need to lose weight with diet and exercise. We have discussed specific  strategies for this.  The patient is in agreement with the above plan. The patient left the office in stable condition.  The patient will follow up in 8 weeks due to starting medication and follow-up for reassessment.   Medication Adjustments/Labs and Tests Ordered: Current medicines are reviewed at length with the patient today.  Concerns regarding medicines are outlined above.  Orders Placed This Encounter  Procedures  . EKG 12-Lead   No orders of the defined types were placed in this encounter.   Patient Instructions  Medication Instructions:  Take you beta blocker as prescribed.   *If you need a refill on your cardiac medications before your next appointment, please call your pharmacy*   Lab Work: None If you have labs (blood work) drawn today and your tests are completely normal, you will receive your results only by: 13/11/21 MyChart Message (if you have MyChart) OR . A paper copy in the mail If you have any lab test that is abnormal or we  need to change your treatment, we will call you to review the results.   Testing/Procedures: None   Follow-Up: At [facility], you and your health needs are our priority.  As part of our continuing mission to provide you with exceptional heart care, we have created designated Provider Care Teams.  These Care Teams include your primary Cardiologist (physician) and Advanced Practice Providers (APPs -  Physician Assistants and Nurse Practitioners) who all work together to provide you with the care you need, when you need it.  We recommend signing up for the patient portal called "MyChart".  Sign up information is provided on this After Visit Summary.  MyChart is used to connect with patients for Virtual Visits (Telemedicine).  Patients are able to view lab/test results, encounter notes, upcoming appointments, etc.  Non-urgent messages can be sent to your provider as well.   To learn more about what you can do with MyChart, go to  [email or web address].    Your next appointment:   8 week(s)  The format for your next appointment:   In Person  Provider:   [name], DO   Other Instructions      Adopting a Healthy Lifestyle.  Know what a healthy weight is for you (roughly BMI <25) and aim to maintain this   Aim for 7+ servings of fruits and vegetables daily   65-80+ fluid ounces of water or unsweet tea for healthy kidneys   Limit to max 1 drink of alcohol per day; avoid smoking/tobacco   Limit animal fats in diet for cholesterol and heart health - choose grass fed whenever available   Avoid highly processed foods, and foods high in saturated/trans fats   Aim for low stress - take time to unwind and care for your mental health   Aim for 150 min of moderate intensity exercise weekly for heart health, and weights twice weekly for bone health   Aim for 7-9 hours of sleep daily   When it comes to diets, agreement about the perfect plan isnt easy to find, even among the experts. Experts at the [facility] [employer] developed an idea known as the Healthy Eating Plate. Just imagine a plate divided into logical, healthy portions.   The emphasis is on diet quality:   Load up on vegetables and fruits - one-half of your plate: Aim for color and variety, and remember that potatoes dont count.   Go for whole grains - one-quarter of your plate: Whole wheat, barley, wheat berries, quinoa, oats, brown rice, and foods made with them. If you want pasta, go with whole wheat pasta.   Protein power - one-quarter of your plate: Fish, chicken, beans, and nuts are all healthy, versatile protein sources. Limit red meat.   The diet, however, does go beyond the plate, offering a few other suggestions.   Use healthy plant oils, such as olive, canola, soy, corn, sunflower and peanut. Check the labels, and avoid partially hydrogenated oil, which have unhealthy trans fats.   If youre thirsty, drink water.  Coffee and tea are good in moderation, but skip sugary drinks and limit milk and dairy products to one or two daily servings.   The type of carbohydrate in the diet is more important than the amount. Some sources of carbohydrates, such as vegetables, fruits, whole grains, and beans-are healthier than others.   Finally, stay active  Signed, [name], DO  12/18/2020 11:16 AM    [facility]

## 2020-12-18 NOTE — Patient Instructions (Signed)
Medication Instructions:  Take you beta blocker as prescribed.   *If you need a refill on your cardiac medications before your next appointment, please call your pharmacy*   Lab Work: None If you have labs (blood work) drawn today and your tests are completely normal, you will receive your results only by: Marland Kitchen MyChart Message (if you have MyChart) OR . A paper copy in the mail If you have any lab test that is abnormal or we need to change your treatment, we will call you to review the results.   Testing/Procedures: None   Follow-Up: At [facility], you and your health needs are our priority.  As part of our continuing mission to provide you with exceptional heart care, we have created designated Provider Care Teams.  These Care Teams include your primary Cardiologist (physician) and Advanced Practice Providers (APPs -  Physician Assistants and Nurse Practitioners) who all work together to provide you with the care you need, when you need it.  We recommend signing up for the patient portal called "MyChart".  Sign up information is provided on this After Visit Summary.  MyChart is used to connect with patients for Virtual Visits (Telemedicine).  Patients are able to view lab/test results, encounter notes, upcoming appointments, etc.  Non-urgent messages can be sent to your provider as well.   To learn more about what you can do with MyChart, go to [email or web address].    Your next appointment:   8 week(s)  The format for your next appointment:   In Person  Provider:   [name], DO   Other Instructions

## 2021-01-07 ENCOUNTER — Other Ambulatory Visit: Payer: Self-pay | Admitting: Cardiology

## 2021-01-07 NOTE — Telephone Encounter (Signed)
Vitamin d approved and sent

## 2021-02-12 ENCOUNTER — Encounter: Payer: Self-pay | Admitting: Cardiology

## 2021-02-12 ENCOUNTER — Other Ambulatory Visit: Payer: Self-pay

## 2021-02-12 ENCOUNTER — Ambulatory Visit: Payer: Medicare HMO | Admitting: Cardiology

## 2021-02-12 VITALS — BP 124/80 | HR 88 | Ht 59.0 in | Wt 178.4 lb

## 2021-02-12 DIAGNOSIS — I10 Essential (primary) hypertension: Secondary | ICD-10-CM | POA: Diagnosis not present

## 2021-02-12 DIAGNOSIS — E669 Obesity, unspecified: Secondary | ICD-10-CM | POA: Diagnosis not present

## 2021-02-12 DIAGNOSIS — I2510 Atherosclerotic heart disease of native coronary artery without angina pectoris: Secondary | ICD-10-CM | POA: Diagnosis not present

## 2021-02-12 DIAGNOSIS — I471 Supraventricular tachycardia: Secondary | ICD-10-CM

## 2021-02-12 NOTE — Patient Instructions (Signed)

## 2021-02-12 NOTE — Progress Notes (Signed)
Cardiology Office Note:    Date:  02/12/2021   ID:  Tina Perkins, DOB [birth year], MRN [id number]  PCP:  [name], MD  Cardiologist:  [name], DO  Electrophysiologist:  None   Referring MD: [name]   I am doing fine  History of Present Illness:    Tina Perkins is a 80 y.o. female with a hx of minimal coronary artery disease seen by CTA, obesity, hyperlipidemia, prediabetes, history of COVID-19 pneumonia is here today for follow-up visit.  I saw the patient in December 2021 at that time we had just started her on low-dose beta-blocker due to paroxysmal atrial tachycardia but unfortunately she did not take that medicine.  She followed on December 18, 2020 we talked about restarting metoprolol XL.  And she did have some vitamin D deficiency which has started her vitamin D supplement.  She is here today for follow-up visit.  Has stopped her vitamin D supplement.  Prefers not to take the beta-blocker.  Past Medical History:  Diagnosis Date  . Acute respiratory disease due to COVID-19 virus 08/12/2019  . BMI 35.0-35.9,adult 03/31/2020  . Chest pain, precordial 04/10/2020  . Coronary artery disease involving native coronary artery of native heart without angina pectoris 05/10/2016   Apparently coronary intervention done in 1993 in [place] She was fine to have 75% narrowing of the mid LAD, nitroglycerin was given reduction to 30-40% it was also described as bridging in that area  . Daytime somnolence 09/15/2020  . Essential hypertension 04/10/2020  . History of hepatitis 12/14/2019  . Mixed hyperlipidemia 12/14/2019  . Obesity (BMI 30-39.9) 04/10/2020  . Patent foramen ovale 12/14/2019  . Pneumonia due to COVID-19 virus 08/12/2019  . Prediabetes 12/28/2019  . Pruritus 12/14/2019  . Second degree AV block, Mobitz type I 09/15/2020  . Sequelae of cerebral infarction 12/14/2019  . Shortness of breath 04/10/2020  . Snoring 09/15/2020  . Spinal stenosis, lumbar region  with neurogenic claudication 02/28/2019   Formatting of this note might be different from the original. Added automatically from request for surgery [id number] Formatting of this note might be different from the original. Added automatically from request for surgery [id number]    Past Surgical History:  Procedure Laterality Date  . COLON SURGERY    . JOINT REPLACEMENT      Current Medications: Current Meds  Medication Sig  . amLODipine (NORVASC) 2.5 MG tablet Take 2.5 mg by mouth daily.  . clotrimazole-betamethasone (LOTRISONE) cream Apply 1 application topically 2 (two) times daily.  . meclizine (ANTIVERT) 12.5 MG tablet Take 1 tablet (12.5 mg total) by mouth 3 (three) times daily as needed for dizziness.  . nitroGLYCERIN (NITROSTAT) 0.4 MG SL tablet Place 1 tablet (0.4 mg total) under the tongue every 5 (five) minutes as needed for chest pain.     Allergies:   Sulfa antibiotics   Social History   Socioeconomic History  . Marital status: Widowed    Spouse name: Not on file  . Number of children: 11  . Years of education: Not on file  . Highest education level: Not on file  Occupational History  . Occupation: Retired  Tobacco Use  . Smoking status: Never Smoker  . Smokeless tobacco: Never Used  Vaping Use  . Vaping Use: Never used  Substance and Sexual Activity  . Alcohol use: Never  . Drug use: Never  . Sexual activity: Not Currently  Other Topics Concern  . Not on file  Social History Narrative  .  Not on file   Social Determinants of Health   Financial Resource Strain: Not on file  Food Insecurity: Not on file  Transportation Needs: Not on file  Physical Activity: Not on file  Stress: Not on file  Social Connections: Not on file     Family History: The patient's family history includes Cancer in her father and mother; Heart attack in her father and mother.  ROS:   Review of Systems  Constitution: Negative for decreased appetite, fever and weight gain.  HENT:  Negative for congestion, ear discharge, hoarse voice and sore throat.   Eyes: Negative for discharge, redness, vision loss in right eye and visual halos.  Cardiovascular: Negative for chest pain, dyspnea on exertion, leg swelling, orthopnea and palpitations.  Respiratory: Negative for cough, hemoptysis, shortness of breath and snoring.   Endocrine: Negative for heat intolerance and polyphagia.  Hematologic/Lymphatic: Negative for bleeding problem. Does not bruise/bleed easily.  Skin: Negative for flushing, nail changes, rash and suspicious lesions.  Musculoskeletal: Negative for arthritis, joint pain, muscle cramps, myalgias, neck pain and stiffness.  Gastrointestinal: Negative for abdominal pain, bowel incontinence, diarrhea and excessive appetite.  Genitourinary: Negative for decreased libido, genital sores and incomplete emptying.  Neurological: Negative for brief paralysis, focal weakness, headaches and loss of balance.  Psychiatric/Behavioral: Negative for altered mental status, depression and suicidal ideas.  Allergic/Immunologic: Negative for HIV exposure and persistent infections.    EKGs/Labs/Other Studies Reviewed:    The following studies were reviewed today:   EKG: None today  ZIO Monitor: The patient wore the monitor for 14 days starting August 07, 2020. Indication: Palpitations  The minimum heart rate was 39 bpm, maximum heart rate was 167 bpm, and average heart rate was 77 bpm. Predominant underlying rhythm was Sinus Rhythm. Second Degree AV Block-Mobitz I (Wenckebach) was present ( August 20, 2020 at 11:23 PM).  39 Supraventricular Tachycardia runs occurred, the run with the fastest interval lasting 8 beats with a maximum rate of 167 bpm, the longest lasting 12 beats with an average rate of 132 bpm.  Premature atrial complexes were rare. Premature Ventricular complexeswere rare.  No ventricular tachycardia, no pauses, No AV block and no atrial fibrillation  present.  25 patient triggered events 10 associated with premature ventricular complex, 6 associated with premature atrial complex remaining associated with sinus rhythm. 10 diary events noted associated with premature atrial complex in sinus rhythm.   Conclusion: This study is remarkable for paroxysmal atrial tachycardia with variable block, and second degree AV block Mobitz type I which occur during the overnight hours.   TTEIMPRESSIONS  1. Left ventricular ejection fraction, by estimation, is 60 to 65%. The left ventricle has normal function. The left ventricle has no regional wall motion abnormalities. There is mild concentric left ventricular  hypertrophy. Left ventricular diastolic  parameters are consistent with Grade II diastolic dysfunction (pseudonormalization). Elevated left atrial pressure.  2. Right ventricular systolic function is normal. The right ventricular size is normal. There is normal pulmonary artery systolic pressure.  3. The mitral valve is normal in structure. No evidence of mitral valve regurgitation. No evidence of mitral stenosis.  4. The aortic valve is normal in structure. Aortic valve regurgitation is mild. Mild aortic valve sclerosis is present, with no evidence of aortic  valve stenosis.  5. The inferior vena cava is normal in size with greater than 50% respiratory variability, suggesting right atrial pressure of 3 mmHg.   CCTAIMPRESSION: 1. Coronary calcium score of 12. This was 23 percentile  for age and sex matched control.  2. Normal coronary origin with right dominance.  3. Minimal Coronary artery disease. CADRAD 1. Recommend medical therapy.  Recent Labs: 07/31/2020: ALT 23; BUN 14; Creatinine, Ser 0.67; Hemoglobin 14.5; Platelets 252; Potassium 4.7; Sodium 140; TSH 0.372  Recent Lipid Panel    Component Value Date/Time   CHOL 173 07/31/2020 1020   TRIG 154 (H) 07/31/2020 1020   HDL 46 07/31/2020 1020   CHOLHDL 3.8 07/31/2020  1020   LDLCALC 100 (H) 07/31/2020 1020    Physical Exam:    VS:  BP 124/80   Pulse 88   Ht 4\' 11"  (1.499 m)   Wt 178 lb 6.4 oz (80.9 kg)   SpO2 96%   BMI 36.03 kg/m     Wt Readings from Last 3 Encounters:  02/12/21 178 lb 6.4 oz (80.9 kg)  12/18/20 185 lb (83.9 kg)  09/15/20 177 lb 12.8 oz (80.6 kg)     GEN: Well nourished, well developed in no acute distress HEENT: Normal NECK: No JVD; No carotid bruits LYMPHATICS: No lymphadenopathy CARDIAC: S1S2 noted,RRR, no murmurs, rubs, gallops RESPIRATORY:  Clear to auscultation without rales, wheezing or rhonchi  ABDOMEN: Soft, non-tender, non-distended, +bowel sounds, no guarding. EXTREMITIES: No edema, No cyanosis, no clubbing MUSCULOSKELETAL:  No deformity  SKIN: Warm and dry NEUROLOGIC:  Alert and oriented x 3, non-focal PSYCHIATRIC:  Normal affect, good insight  ASSESSMENT:    1. Hypertension, unspecified type   2. Coronary artery disease involving native coronary artery of native heart without angina pectoris   3. PAT (paroxysmal atrial tachycardia) (HCC)   4. Essential hypertension   5. Obesity (BMI 30-39.9)    PLAN:     She is doing well from a cardiovascular standpoint.  She prefers not to take any medication to help with atrial tachycardia she says she will be able to tolerate these palpitations.  Blood pressure is acceptable, continue with current antihypertensive regimen.  The patient understands the need to lose weight with diet and exercise. We have discussed specific strategies for this.  Prediabetes-this is being managed by his primary care doctor.  No adjustments for antidiabetic medications were made today.    The patient is in agreement with the above plan. The patient left the office in stable condition.  The patient will follow up in   Medication Adjustments/Labs and Tests Ordered: Current medicines are reviewed at length with the patient today.  Concerns regarding medicines are outlined above.   No orders of the defined types were placed in this encounter.  No orders of the defined types were placed in this encounter.   Patient Instructions  Medication Instructions:  Your physician recommends that you continue on your current medications as directed. Please refer to the Current Medication list given to you today.  *If you need a refill on your cardiac medications before your next appointment, please call your pharmacy*   Lab Work: None If you have labs (blood work) drawn today and your tests are completely normal, you will receive your results only by: Marland Kitchen. MyChart Message (if you have MyChart) OR . A paper copy in the mail If you have any lab test that is abnormal or we need to change your treatment, we will call you to review the results.   Testing/Procedures: None   Follow-Up: At [facility], you and your health needs are our priority.  As part of our continuing mission to provide you with exceptional heart care, we have created designated  Provider Care Teams.  These Care Teams include your primary Cardiologist (physician) and Advanced Practice Providers (APPs -  Physician Assistants and Nurse Practitioners) who all work together to provide you with the care you need, when you need it.  We recommend signing up for the patient portal called "MyChart".  Sign up information is provided on this After Visit Summary.  MyChart is used to connect with patients for Virtual Visits (Telemedicine).  Patients are able to view lab/test results, encounter notes, upcoming appointments, etc.  Non-urgent messages can be sent to your provider as well.   To learn more about what you can do with MyChart, go to [email or web address].    Your next appointment:   1 year(s)  The format for your next appointment:   In Person  Provider:   [name], DO   Other Instructions      Adopting a Healthy Lifestyle.  Know what a healthy weight is for you (roughly BMI <25) and aim to  maintain this   Aim for 7+ servings of fruits and vegetables daily   65-80+ fluid ounces of water or unsweet tea for healthy kidneys   Limit to max 1 drink of alcohol per day; avoid smoking/tobacco   Limit animal fats in diet for cholesterol and heart health - choose grass fed whenever available   Avoid highly processed foods, and foods high in saturated/trans fats   Aim for low stress - take time to unwind and care for your mental health   Aim for 150 min of moderate intensity exercise weekly for heart health, and weights twice weekly for bone health   Aim for 7-9 hours of sleep daily   When it comes to diets, agreement about the perfect plan isnt easy to find, even among the experts. Experts at the [facility] [employer] developed an idea known as the Healthy Eating Plate. Just imagine a plate divided into logical, healthy portions.   The emphasis is on diet quality:   Load up on vegetables and fruits - one-half of your plate: Aim for color and variety, and remember that potatoes dont count.   Go for whole grains - one-quarter of your plate: Whole wheat, barley, wheat berries, quinoa, oats, brown rice, and foods made with them. If you want pasta, go with whole wheat pasta.   Protein power - one-quarter of your plate: Fish, chicken, beans, and nuts are all healthy, versatile protein sources. Limit red meat.   The diet, however, does go beyond the plate, offering a few other suggestions.   Use healthy plant oils, such as olive, canola, soy, corn, sunflower and peanut. Check the labels, and avoid partially hydrogenated oil, which have unhealthy trans fats.   If youre thirsty, drink water. Coffee and tea are good in moderation, but skip sugary drinks and limit milk and dairy products to one or two daily servings.   The type of carbohydrate in the diet is more important than the amount. Some sources of carbohydrates, such as vegetables, fruits, whole grains, and beans-are  healthier than others.   Finally, stay active  Signed, [name], DO  02/12/2021 10:56 AM    [facility]

## 2021-03-13 IMAGING — CT CT HEART MORP W/ CTA COR W/ SCORE W/ CA W/CM &/OR W/O CM
2 of 8 series · 10 of 20 positions shown, 12 images · IV contrast (OMNI 350)
Comparison: None.
COMPARISON: None.

Addendum:
EXAM:
OVER-READ INTERPRETATION  CT CHEST

The following report is an over-read performed by radiologist Dr.
Funmi Yashe [REDACTED] on 06/19/2020. This
over-read does not include interpretation of cardiac or coronary
anatomy or pathology. The coronary calcium score/coronary CTA
interpretation by the cardiologist is attached.
CLINICAL DATA: 79 year old female with chest pain. Medical history
includes hypertension, and hyperlipidemia.
Cardiac/Coronary  CT
TECHNIQUE: The patient was scanned on a Phillips Force scanner.

[Series 8: mulitphase · axial · 0.39mm/px · z∈[+1172,+1240]mm · 4 of 2034 slices shown]
[im 407/2034  vessel]
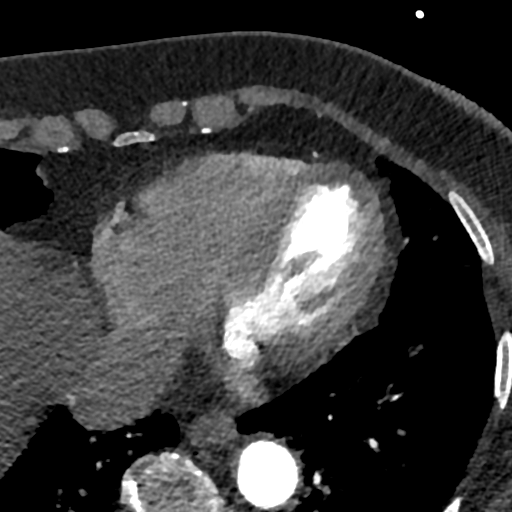
[im 814/2034  vessel]
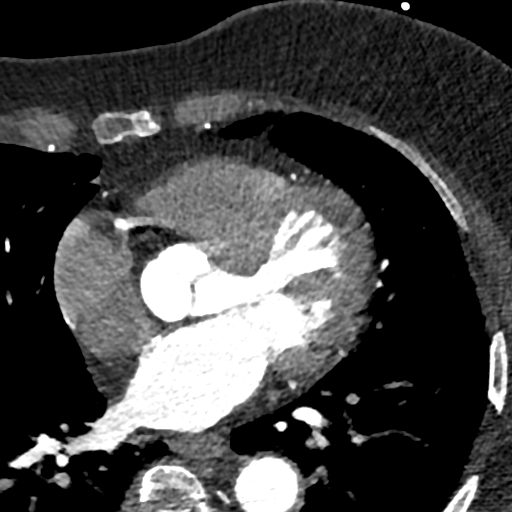
[im 1220/2034  vessel]
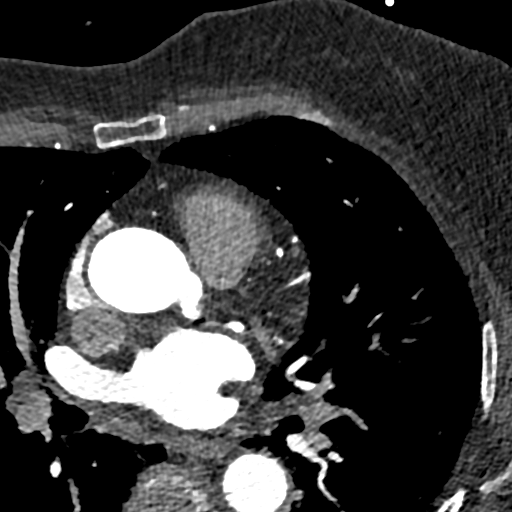
[im 1627/2034  vessel]
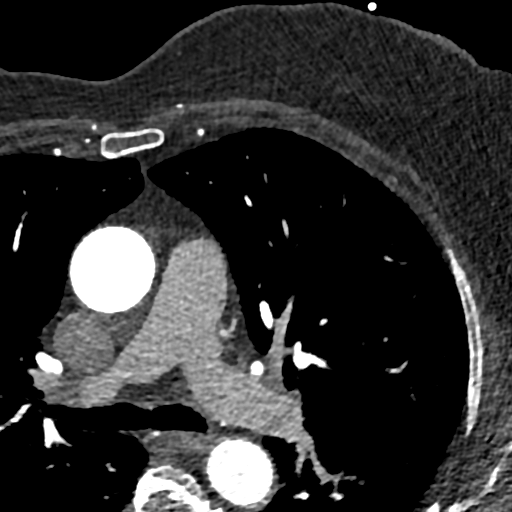

[Series 11: multiphase · axial · 0.39mm/px · z∈[+1166,+1246]mm · 6 of 2712 slices shown, 8 images]
[im 388/2712  vessel]
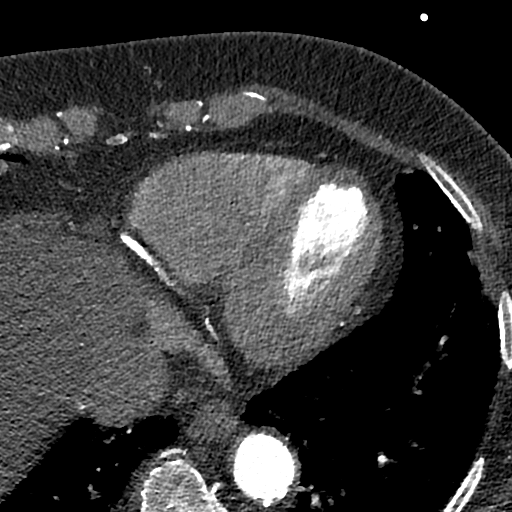
[im 388/2712  lung]
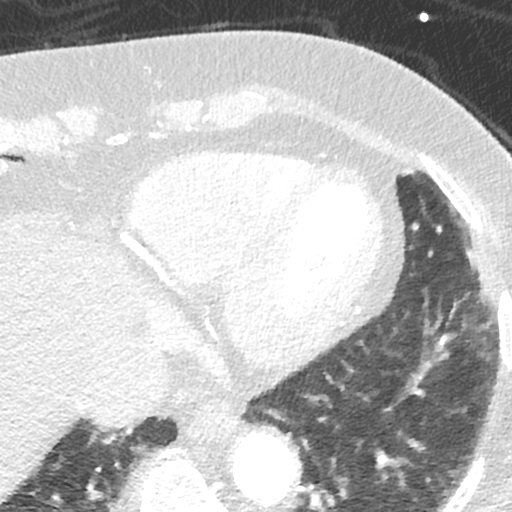
[im 775/2712  vessel]
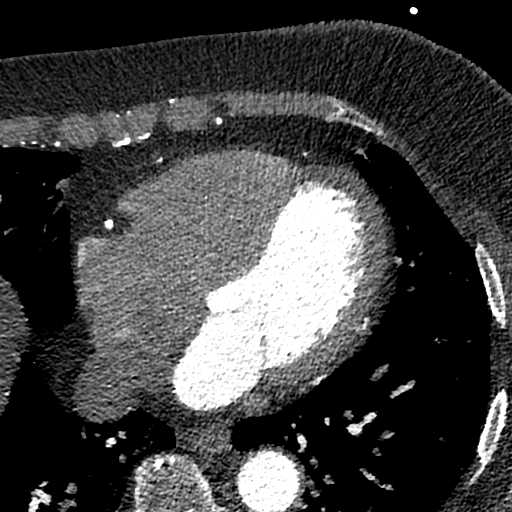
[im 1162/2712  vessel]
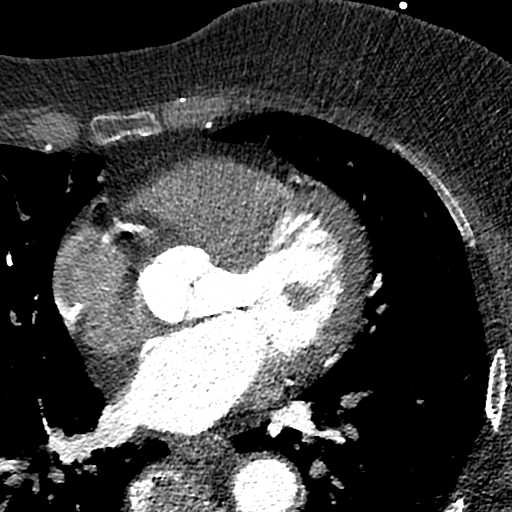
[im 1550/2712  vessel]
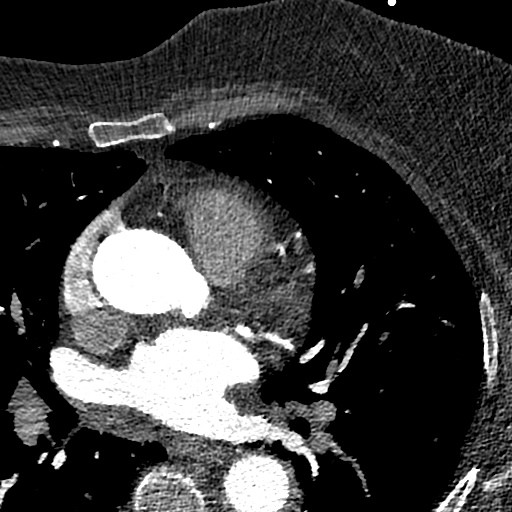
[im 1937/2712  vessel]
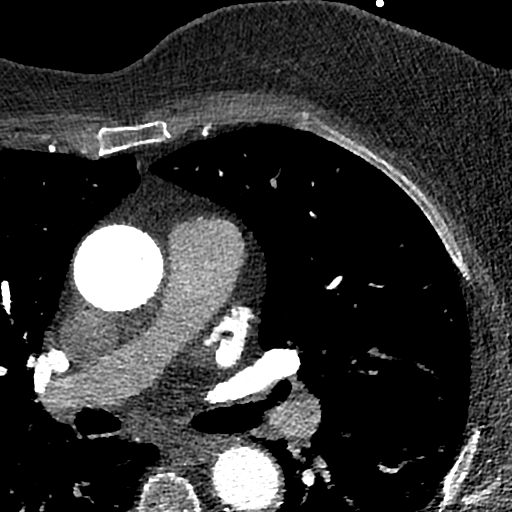
[im 1937/2712  lung]
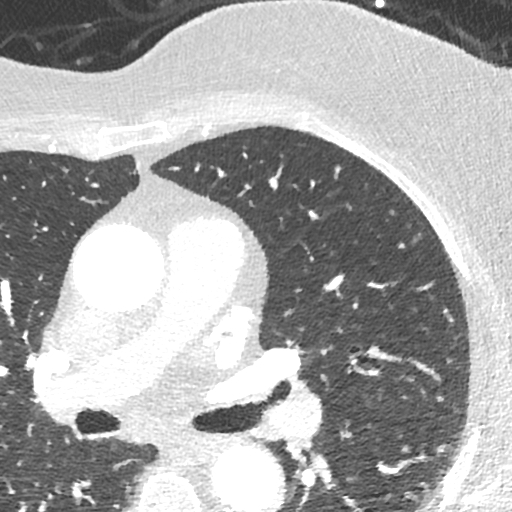
[im 2324/2712  vessel]
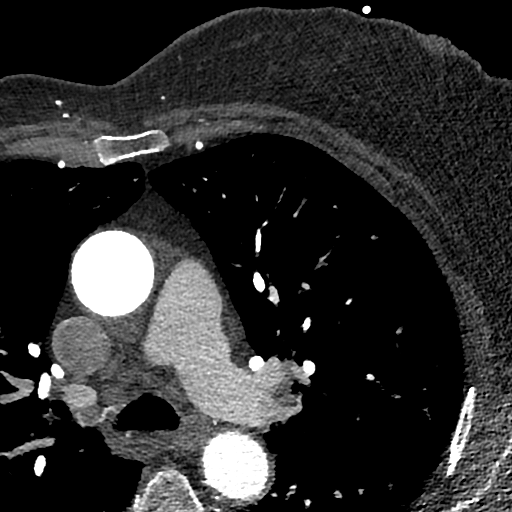

[10 of 20 positions shown; findings below may reference images not displayed]

FINDINGS: Aortic atherosclerosis. Within the visualized portions of the thorax
there are no suspicious appearing pulmonary nodules or masses, there
is no acute consolidative airspace disease, no pleural effusions, no
pneumothorax and no lymphadenopathy. Visualized portions of the
upper abdomen demonstrates diffuse low attenuation throughout the
visualized hepatic parenchyma, indicative of hepatic steatosis.
There are no aggressive appearing lytic or blastic lesions noted in
the visualized portions of the skeleton.
IMPRESSION: 1.  Aortic Atherosclerosis (1JPSE-L7V.V).
2. Hepatic steatosis.
FINDINGS: A 120 kV prospective scan was triggered in the descending thoracic
aorta at 111 HU's. Axial non-contrast 3 mm slices were carried out
through the heart. The data set was analyzed on a dedicated work
station and scored using the Agatson method. Gantry rotation speed
was 250 msecs and collimation was .6 mm. No beta blockade and 0.8 mg
of sl NTG was given. The 3D data set was reconstructed in 5%
intervals of the 67-82 % of the R-R cycle. Diastolic phases were
analyzed on a dedicated work station using MPR, MIP and VRT modes.
The patient received 80 cc of contrast.

Aorta: Normal size.  No calcifications.  No dissection.

Aortic Valve:  Trileaflet.  No calcifications.

Coronary Arteries:  Normal coronary origin.  Right dominance.

RCA is a large dominant artery that gives rise to PDA and PLVB.
There is no plaque.

Left main is a large artery that gives rise to LAD and LCX arteries.

LAD is a large vessel. There is a minimal (<24%) calcified plaque in
the proximal portion of the LAD. The mid and distal portion with no
plaque. D1 on with a minimal soft plaque in the proximal portion of
the vessel. D2 with no plaque.

LCX is a non-dominant artery that gives rise to one large OM1
branch. There is no plaque.

Other findings:

Normal pulmonary vein drainage into the left atrium.

Normal left atrial appendage without a thrombus.

Normal size of the pulmonary artery.
IMPRESSION: 1. Coronary calcium score of 12. This was 23 percentile for age and
sex matched control.

2. Normal coronary origin with right dominance.

3. Minimal Coronary artery disease. CADRAD 1. Recommend medical
therapy.

Jekaterina Bookgirl Zraeva, DO

*** End of Addendum ***
EXAM:
OVER-READ INTERPRETATION  CT CHEST

The following report is an over-read performed by radiologist Dr.
Funmi Yashe [REDACTED] on 06/19/2020. This
over-read does not include interpretation of cardiac or coronary
anatomy or pathology. The coronary calcium score/coronary CTA
interpretation by the cardiologist is attached.
FINDINGS: Aortic atherosclerosis. Within the visualized portions of the thorax
there are no suspicious appearing pulmonary nodules or masses, there
is no acute consolidative airspace disease, no pleural effusions, no
pneumothorax and no lymphadenopathy. Visualized portions of the
upper abdomen demonstrates diffuse low attenuation throughout the
visualized hepatic parenchyma, indicative of hepatic steatosis.
There are no aggressive appearing lytic or blastic lesions noted in
the visualized portions of the skeleton.
IMPRESSION: 1.  Aortic Atherosclerosis (1JPSE-L7V.V).
2. Hepatic steatosis.

## 2021-03-15 ENCOUNTER — Other Ambulatory Visit: Payer: Self-pay | Admitting: Cardiology

## 2021-03-16 NOTE — Telephone Encounter (Signed)
Refill for Metoprolol denied, patient not taking medication

## 2021-03-27 DIAGNOSIS — M810 Age-related osteoporosis without current pathological fracture: Secondary | ICD-10-CM | POA: Diagnosis not present

## 2021-03-27 DIAGNOSIS — G8929 Other chronic pain: Secondary | ICD-10-CM | POA: Diagnosis not present

## 2021-03-27 DIAGNOSIS — R42 Dizziness and giddiness: Secondary | ICD-10-CM | POA: Diagnosis not present

## 2021-03-27 DIAGNOSIS — I252 Old myocardial infarction: Secondary | ICD-10-CM | POA: Diagnosis not present

## 2021-03-27 DIAGNOSIS — Z6837 Body mass index (BMI) 37.0-37.9, adult: Secondary | ICD-10-CM | POA: Diagnosis not present

## 2021-03-27 DIAGNOSIS — M0550 Rheumatoid polyneuropathy with rheumatoid arthritis of unspecified site: Secondary | ICD-10-CM | POA: Diagnosis not present

## 2021-03-27 DIAGNOSIS — I25119 Atherosclerotic heart disease of native coronary artery with unspecified angina pectoris: Secondary | ICD-10-CM | POA: Diagnosis not present

## 2021-03-27 DIAGNOSIS — I10 Essential (primary) hypertension: Secondary | ICD-10-CM | POA: Diagnosis not present

## 2021-03-27 DIAGNOSIS — M1990 Unspecified osteoarthritis, unspecified site: Secondary | ICD-10-CM | POA: Diagnosis not present

## 2021-04-27 ENCOUNTER — Other Ambulatory Visit: Payer: Self-pay | Admitting: Cardiology

## 2021-06-18 ENCOUNTER — Telehealth: Payer: Self-pay

## 2021-06-18 ENCOUNTER — Ambulatory Visit: Payer: Medicare HMO

## 2021-06-18 NOTE — Telephone Encounter (Signed)
Attempted to reach patient for scheduled telephone 3:00 AWV.  No answer/no voicemail available.

## 2021-07-28 ENCOUNTER — Encounter: Payer: Self-pay | Admitting: Legal Medicine

## 2021-07-28 ENCOUNTER — Telehealth: Payer: Self-pay | Admitting: Legal Medicine

## 2021-07-28 ENCOUNTER — Other Ambulatory Visit: Payer: Self-pay

## 2021-07-28 ENCOUNTER — Ambulatory Visit (INDEPENDENT_AMBULATORY_CARE_PROVIDER_SITE_OTHER): Payer: Medicare HMO | Admitting: Legal Medicine

## 2021-07-28 VITALS — BP 146/80 | HR 70 | Temp 96.6°F | Resp 16 | Ht 59.0 in | Wt 182.0 lb

## 2021-07-28 DIAGNOSIS — I471 Supraventricular tachycardia: Secondary | ICD-10-CM

## 2021-07-28 DIAGNOSIS — I6930 Unspecified sequelae of cerebral infarction: Secondary | ICD-10-CM

## 2021-07-28 DIAGNOSIS — R7303 Prediabetes: Secondary | ICD-10-CM

## 2021-07-28 DIAGNOSIS — I10 Essential (primary) hypertension: Secondary | ICD-10-CM

## 2021-07-28 DIAGNOSIS — E782 Mixed hyperlipidemia: Secondary | ICD-10-CM | POA: Diagnosis not present

## 2021-07-28 DIAGNOSIS — I2510 Atherosclerotic heart disease of native coronary artery without angina pectoris: Secondary | ICD-10-CM | POA: Diagnosis not present

## 2021-07-28 DIAGNOSIS — Z889 Allergy status to unspecified drugs, medicaments and biological substances status: Secondary | ICD-10-CM

## 2021-07-28 DIAGNOSIS — R42 Dizziness and giddiness: Secondary | ICD-10-CM

## 2021-07-28 DIAGNOSIS — I4719 Other supraventricular tachycardia: Secondary | ICD-10-CM

## 2021-07-28 MED ORDER — PRAVASTATIN SODIUM 40 MG PO TABS
40.0000 mg | ORAL_TABLET | Freq: Every day | ORAL | 3 refills | Status: DC
Start: 2021-07-28 — End: 2021-10-22

## 2021-07-28 MED ORDER — AMLODIPINE BESYLATE 2.5 MG PO TABS: 2.5000 mg | ORAL_TABLET | Freq: Every day | ORAL | 2 refills | Status: DC

## 2021-07-28 MED ORDER — TRIAMCINOLONE ACETONIDE 0.1 % EX CREA: 1.0000 "application " | TOPICAL_CREAM | Freq: Two times a day (BID) | CUTANEOUS | 0 refills | Status: DC

## 2021-07-28 NOTE — Chronic Care Management (AMB) (Signed)
  Chronic Care Management   Note  07/28/2021 Name: Tina Perkins MRN: [id number] DOB: 1941-09-07  Tina Perkins is a 80 y.o. year old female who is a primary care patient of [name], MD. I reached out to [name] by phone today in response to a referral sent by [name] PCP, [name], MD.   [name] was given information about Chronic Care Management services today including:  CCM service includes personalized support from designated clinical staff supervised by her physician, including individualized plan of care and coordination with other care providers 24/7 contact phone numbers for assistance for urgent and routine care needs. Service will only be billed when office clinical staff spend 20 minutes or more in a month to coordinate care. Only one practitioner may furnish and bill the service in a calendar month. The patient may stop CCM services at any time (effective at the end of the month) by phone call to the office staff.   Patient agreed to services and verbal consent obtained.   Follow up plan:   [name] manager, [employer] food

## 2021-07-28 NOTE — Progress Notes (Signed)
Subjective:  Patient ID: Tina Perkins, female    DOB: 09-26-1941  Age: 80 y.o. MRN: [id number]  Chief Complaint  Patient presents with   Prediabetes   Hyperlipidemia   Hypertension   Rash    HPI: chronic visit, she stopped all medicines  Prediabetes: Patient is not currently taking any medication, however she is eating low carbs diet.  Hyperlipidemia: Patient tries to eat healthy and low fat diet. Patient presents with hyperlipidemia.  Compliance with treatment has been good; patient takes medicines as directed, maintains low cholesterol diet, follows up as directed, and maintains exercise regimen.  Patient is using diet without problems.   Hypertension: She is not currently taking any medication. Patient presents for follow up of hypertension.  Patient tolerating none well with side effects.  Patient was diagnosed with hypertension 2010 so has been treated for hypertension for 10 years.Patient is working on maintaining diet and exercise regimen and follows up as directed. Complication include none.   CORONARY ARTERY DISEASE  Patient presents in follow up of CAD. Patient was diagnosed in 61. The patient has no associated CHF. The patient is currently taking a beta blocker, statin, and aspirin. CAD was diagnosed 20 years ago.  Patient is having no angina. Patient has used no NTG.  Patient is followed by cardiology.  Patient had no angioplasty . Last angiography was 1993, last echocardiogram na.   Fell through [facility] floor and hurt right arm and legs and lost tooth. She has poor living conditions and no means to help herself.   Family not helping with repairs.  She is unable to afford to get car fixed and has been looking for another house.  She has tried [facility] and other agencies without any assistance.  She has stroke and has left sided weakness, 2018.  Current Outpatient Medications on File Prior to Visit  Medication Sig Dispense Refill   meclizine (ANTIVERT) 12.5 MG  tablet Take 1 tablet (12.5 mg total) by mouth 3 (three) times daily as needed for dizziness. 30 tablet 2   nitroGLYCERIN (NITROSTAT) 0.4 MG SL tablet Place 1 tablet (0.4 mg total) under the tongue every 5 (five) minutes as needed for chest pain. 90 tablet 3   No current facility-administered medications on file prior to visit.   Past Medical History:  Diagnosis Date   Acute respiratory disease due to COVID-19 virus 08/12/2019   BMI 35.0-35.9,adult 03/31/2020   Chest pain, precordial 04/10/2020   Coronary artery disease involving native coronary artery of native heart without angina pectoris 05/10/2016   Apparently coronary intervention done in 1993 in [place] She was fine to have 75% narrowing of the mid LAD, nitroglycerin was given reduction to 30-40% it was also described as bridging in that area   Daytime somnolence 09/15/2020   Essential hypertension 04/10/2020   History of hepatitis 12/14/2019   Mixed hyperlipidemia 12/14/2019   Obesity (BMI 30-39.9) 04/10/2020   Patent foramen ovale 12/14/2019   Pneumonia due to COVID-19 virus 08/12/2019   Pneumonia due to COVID-19 virus 08/12/2019   Prediabetes 12/28/2019   Pruritus 12/14/2019   Second degree AV block, Mobitz type I 09/15/2020   Sequelae of cerebral infarction 12/14/2019   Shortness of breath 04/10/2020   Snoring 09/15/2020   Spinal stenosis, lumbar region with neurogenic claudication 02/28/2019   Formatting of this note might be different from the original. Added automatically from request for surgery [id number] Formatting of this note might be different from the original. Added automatically from request  for surgery [id number]   Past Surgical History:  Procedure Laterality Date   COLON SURGERY     JOINT REPLACEMENT      Family History  Problem Relation Age of Onset   Cancer Mother    Heart attack Mother    Heart attack Father    Cancer Father    Social History   Socioeconomic History   Marital status: Widowed    Spouse name: Not on file    Number of children: 11   Years of education: Not on file   Highest education level: Not on file  Occupational History   Occupation: Retired  Tobacco Use   Smoking status: Never   Smokeless tobacco: Never  Vaping Use   Vaping Use: Never used  Substance and Sexual Activity   Alcohol use: Never   Drug use: Never   Sexual activity: Not Currently  Other Topics Concern   Not on file  Social History Narrative   Not on file   Social Determinants of Health   Financial Resource Strain: Not on file  Food Insecurity: Not on file  Transportation Needs: Not on file  Physical Activity: Not on file  Stress: Not on file  Social Connections: Not on file    Review of Systems  Constitutional:  Negative for chills, fatigue and fever.  HENT:  Negative for congestion, ear pain and sore throat.   Respiratory:  Negative for cough and shortness of breath.   Cardiovascular:  Negative for chest pain and palpitations.  Gastrointestinal:  Negative for abdominal pain, constipation, diarrhea, nausea and vomiting.  Endocrine: Negative for polydipsia, polyphagia and polyuria.  Genitourinary:  Negative for difficulty urinating and dysuria.  Musculoskeletal:  Negative for arthralgias, back pain and myalgias.  Skin:  Negative for rash.  Neurological:  Positive for dizziness (Vertigo sometimes). Negative for headaches.  Psychiatric/Behavioral:  Negative for dysphoric mood. The patient is not nervous/anxious.     Objective:  BP (!) 146/80   Pulse 70   Temp (!) 96.6 F (35.9 C)   Resp 16   Ht 4\' 11"  (1.499 m)   Wt 182 lb (82.6 kg)   SpO2 97%   BMI 36.76 kg/m   BP/Weight 07/28/2021 02/12/2021 12/18/2020  Systolic BP 146 124 130  Diastolic BP 80 80 74  Wt. (Lbs) 182 178.4 185  BMI 36.76 36.03 37.37    Physical Exam Vitals reviewed.  Constitutional:      General: She is not in acute distress.    Appearance: Normal appearance. She is obese.  HENT:     Head: Normocephalic.     Right Ear:  Tympanic membrane, ear canal and external ear normal.     Left Ear: Tympanic membrane, ear canal and external ear normal.     Nose: Nose normal.     Mouth/Throat:     Mouth: Mucous membranes are moist.     Pharynx: Oropharynx is clear.  Eyes:     Extraocular Movements: Extraocular movements intact.     Conjunctiva/sclera: Conjunctivae normal.     Pupils: Pupils are equal, round, and reactive to light.  Cardiovascular:     Rate and Rhythm: Normal rate and regular rhythm.     Pulses: Normal pulses.     Heart sounds: No murmur heard.   No gallop.  Pulmonary:     Effort: Pulmonary effort is normal. No respiratory distress.     Breath sounds: Normal breath sounds. No wheezing.  Abdominal:     General: Abdomen is flat.  Bowel sounds are normal. There is no distension.     Palpations: Abdomen is soft.     Tenderness: There is no abdominal tenderness.  Musculoskeletal:        General: Normal range of motion.     Cervical back: Normal range of motion.     Right lower leg: No edema.     Left lower leg: No edema.  Skin:    General: Skin is warm.     Capillary Refill: Capillary refill takes less than 2 seconds.  Neurological:     General: No focal deficit present.     Mental Status: She is alert and oriented to person, place, and time. Mental status is at baseline.     Motor: Weakness (right side 4/5 strength) present.  Psychiatric:        Mood and Affect: Mood normal.        Thought Content: Thought content normal.        Judgment: Judgment normal.        Lab Results  Component Value Date   WBC 8.8 07/31/2020   HGB 14.5 07/31/2020   HCT 43.7 07/31/2020   PLT 252 07/31/2020   GLUCOSE 137 (H) 07/31/2020   CHOL 173 07/31/2020   TRIG 154 (H) 07/31/2020   HDL 46 07/31/2020   LDLCALC 100 (H) 07/31/2020   ALT 23 07/31/2020   AST 20 07/31/2020   NA 140 07/31/2020   K 4.7 07/31/2020   CL 105 07/31/2020   CREATININE 0.67 07/31/2020   BUN 14 07/31/2020   CO2 21 07/31/2020   TSH  0.372 (L) 07/31/2020   HGBA1C 6.5 (H) 07/31/2020      Assessment & Plan:   Problem List Items Addressed This Visit       Cardiovascular and Mediastinum   Coronary artery disease involving native coronary artery of native heart without angina pectoris   Relevant Medications   amLODipine (NORVASC) 2.5 MG tablet   pravastatin (PRAVACHOL) 40 MG tablet An individual plan was formulated based on patient history and exam, labs and evidence based data. Patient has not had recent angina or nitroglycerin use. continue present treatment.    Other Relevant Orders   AMB Referral to [facility] Coordin[facility]   Essential hypertension   Relevant Medications   amLODipine (NORVASC) 2.5 MG tablet   pravastatin (PRAVACHOL) 40 MG tablet   Other Relevant Orders   Comprehensive metabolic panel   CBC with Differential/Platelet   AMB Referral to [facility] Coordinaton An individual hypertension care plan was established and reinforced today.  The patient's status was assessed using clinical findings on exam and labs or diagnostic tests. The patient's success at meeting treatment goals on disease specific evidence-based guidelines and found to be well controlled. SELF MANAGEMENT: The patient and I together assessed ways to personally work towards obtaining the recommended goals. RECOMMENDATIONS: avoid decongestants found in common cold remedies, decrease consumption of alcohol, perform routine monitoring of BP with home BP cuff, exercise, reduction of dietary salt, take medicines as prescribed, try not to miss doses and quit smoking.  Regular exercise and maintaining a healthy weight is needed.  Stress reduction may help. A CLINICAL SUMMARY including written plan identify barriers to care unique to individual due to social or financial issues.  We attempt to mutually creat solutions for individual and family understanding.     PAT (paroxysmal atrial tachycardia) (HCC)   Relevant Medications   amLODipine  (NORVASC) 2.5 MG tablet   pravastatin (PRAVACHOL) 40 MG  tablet Patient has a diagnosis of paroxysmal atrial fibrillation.   Patient is on none anticoagulant and has controlled ventricular response.  Patient is CV stable.      Other   Mixed hyperlipidemia   Relevant Medications   amLODipine (NORVASC) 2.5 MG tablet   pravastatin (PRAVACHOL) 40 MG tablet   Other Relevant Orders   Lipid panel   AMB Referral to [facility] Coordin[facility] AN INDIVIDUAL CARE PLAN for hyperlipidemia/ cholesterol was established and reinforced today.  The patient's status was assessed using clinical findings on exam, lab and other diagnostic tests. The patient's disease status was assessed based on evidence-based guidelines and found to be good controlled. MEDICATIONS were reviewed. SELF MANAGEMENT GOALS have been discussed and patient's success at attaining the goal of low cholesterol was assessed. RECOMMENDATION given include regular exercise 3 days a week and low cholesterol/low fat diet. CLINICAL SUMMARY including written plan to identify barriers unique to the patient due to social or economic  reasons was discussed.    Sequelae of cerebral infarction   Relevant Orders   AMB Referral to [facility] Coordinaton Patient still has some residual weakness    Prediabetes - Primary   Relevant Orders   Hemoglobin A1c   AMB Referral to [facility] Coordinaton Patient continues with diet, check A1c   Other Visit Diagnoses     Vertigo     Occasional vertigo- stable now   Atopy       Relevant Medications   triamcinolone cream (KENALOG) 0.1 % Atopic rash , use triamcinolone cream     .try to get assistance for her substandard hosing situation that is a danger to an elderly person         Follow-up: Return in about 3 months (around 10/28/2021), or nurse visit 2 weeks for bp, for fasting.  An After Visit Summary was printed and given to the patient.  Brent Bulla, MD [facility] [phone]

## 2021-07-28 NOTE — Chronic Care Management (AMB) (Signed)
ERROR

## 2021-07-29 ENCOUNTER — Telehealth: Payer: Self-pay | Admitting: Legal Medicine

## 2021-07-29 LAB — CBC WITH DIFFERENTIAL/PLATELET
Basophils Absolute: 0.1 10*3/uL (ref 0.0–0.2)
Basos: 1 %
EOS (ABSOLUTE): 0.2 10*3/uL (ref 0.0–0.4)
Eos: 3 %
Hematocrit: 43.7 % (ref 34.0–46.6)
Hemoglobin: 14.6 g/dL (ref 11.1–15.9)
Immature Grans (Abs): 0 10*3/uL (ref 0.0–0.1)
Immature Granulocytes: 0 %
Lymphocytes Absolute: 1.7 10*3/uL (ref 0.7–3.1)
Lymphs: 30 %
MCH: 29.4 pg (ref 26.6–33.0)
MCHC: 33.4 g/dL (ref 31.5–35.7)
MCV: 88 fL (ref 79–97)
Monocytes Absolute: 0.5 10*3/uL (ref 0.1–0.9)
Monocytes: 9 %
Neutrophils Absolute: 3.3 10*3/uL (ref 1.4–7.0)
Neutrophils: 57 %
Platelets: 234 10*3/uL (ref 150–450)
RBC: 4.97 x10E6/uL (ref 3.77–5.28)
RDW: 11.8 % (ref 11.7–15.4)
WBC: 5.8 10*3/uL (ref 3.4–10.8)

## 2021-07-29 LAB — LIPID PANEL
Chol/HDL Ratio: 4.2 ratio (ref 0.0–4.4)
Cholesterol, Total: 172 mg/dL (ref 100–199)
HDL: 41 mg/dL (ref >39)
LDL Chol Calc (NIH): 97 mg/dL (ref 0–99)
Triglycerides: 196 mg/dL — ABNORMAL HIGH (ref 0–149)
VLDL Cholesterol Cal: 34 mg/dL (ref 5–40)

## 2021-07-29 LAB — COMPREHENSIVE METABOLIC PANEL
ALT: 25 IU/L (ref 0–32)
AST: 24 IU/L (ref 0–40)
Albumin/Globulin Ratio: 1.9 (ref 1.2–2.2)
Albumin: 4.5 g/dL (ref 3.7–4.7)
Alkaline Phosphatase: 87 IU/L (ref 44–121)
BUN/Creatinine Ratio: 25 (ref 12–28)
BUN: 17 mg/dL (ref 8–27)
Bilirubin Total: 0.5 mg/dL (ref 0.0–1.2)
CO2: 20 mmol/L (ref 20–29)
Calcium: 9.7 mg/dL (ref 8.7–10.3)
Chloride: 106 mmol/L (ref 96–106)
Creatinine, Ser: 0.67 mg/dL (ref 0.57–1.00)
Globulin, Total: 2.4 g/dL (ref 1.5–4.5)
Glucose: 151 mg/dL — ABNORMAL HIGH (ref 70–99)
Potassium: 4.8 mmol/L (ref 3.5–5.2)
Sodium: 142 mmol/L (ref 134–144)
Total Protein: 6.9 g/dL (ref 6.0–8.5)
eGFR: 88 mL/min/{1.73_m2} (ref >59)

## 2021-07-29 LAB — HEMOGLOBIN A1C
Est. average glucose Bld gHb Est-mCnc: 143 mg/dL
Hgb A1c MFr Bld: 6.6 % — ABNORMAL HIGH (ref 4.8–5.6)

## 2021-07-29 LAB — CARDIOVASCULAR RISK ASSESSMENT

## 2021-07-29 NOTE — Progress Notes (Signed)
Glucose 151 high, kidney tests normal, liver tests normal, A1c 6.6, triglycerides high 196, CBC normal,  lp

## 2021-07-29 NOTE — Progress Notes (Signed)
error 

## 2021-07-30 ENCOUNTER — Ambulatory Visit (INDEPENDENT_AMBULATORY_CARE_PROVIDER_SITE_OTHER): Payer: Medicare HMO | Admitting: *Deleted

## 2021-07-30 DIAGNOSIS — I10 Essential (primary) hypertension: Secondary | ICD-10-CM

## 2021-07-30 DIAGNOSIS — M48062 Spinal stenosis, lumbar region with neurogenic claudication: Secondary | ICD-10-CM

## 2021-07-30 DIAGNOSIS — I6930 Unspecified sequelae of cerebral infarction: Secondary | ICD-10-CM

## 2021-07-30 DIAGNOSIS — I2510 Atherosclerotic heart disease of native coronary artery without angina pectoris: Secondary | ICD-10-CM

## 2021-07-30 DIAGNOSIS — I471 Supraventricular tachycardia: Secondary | ICD-10-CM

## 2021-07-30 DIAGNOSIS — R7303 Prediabetes: Secondary | ICD-10-CM

## 2021-07-30 DIAGNOSIS — I951 Orthostatic hypotension: Secondary | ICD-10-CM

## 2021-07-30 DIAGNOSIS — I441 Atrioventricular block, second degree: Secondary | ICD-10-CM

## 2021-07-31 ENCOUNTER — Telehealth: Payer: Self-pay | Admitting: *Deleted

## 2021-07-31 NOTE — Patient Instructions (Signed)
Visit Information   PATIENT GOALS:   Goals Addressed               This Visit's Progress     Find Help in My Community. (pt-stated)   On track     Timeframe:  Short-Term Goal Priority:  High Start Date:   07/30/2021                        Expected End Date:   09/29/2021                  Follow-Up Date:  08/14/2021 at 12:30pm  Patient Goals/Self-Care Activities: Work with [employer] and [employer], on a weekly/bi-weekly basis, to obtain housing, transportation and financial resources and assistance.   Contact LCSW directly (# [phone]) if you have questions, need assistance, or if additional social work needs are identified. Continue to consider applying for [employer], [employer], Section 8 Housing and/or [employer], through the [employer].   Review lists of housing, transportation and financial resources provided, and begin contacting agencies of interest.   International aid/development worker [phone]), for transportation assistance to and from all [place] affiliated facilities and/or providers.        Consent to CCM Services: Ms. Bartl was given information about Chronic Care Management services including:  CCM service includes personalized support from designated clinical staff supervised by her physician, including individualized plan of care and coordination with other care providers 24/7 contact phone numbers for assistance for urgent and routine care needs. Service will only be billed when office clinical staff spend 20 minutes or more in a month to coordinate care. Only one practitioner may furnish and bill the service in a calendar month. The patient may stop CCM services at any time (effective at the end of the month) by phone call to the office staff. The patient will be responsible for cost sharing (co-pay) of up to 20% of the service fee (after annual deductible is met).  Patient agreed to services  and verbal consent obtained.   Patient verbalizes understanding of instructions provided today and agrees to view in [place].   Telephone follow up appointment with care management team member scheduled for:  08/14/2021 at 12:30pm  Nat Christen LCSW Licensed Clinical Social Worker [place] [phone]   CLINICAL CARE PLAN: Patient Care Plan: LCSW Plan of Care     Problem Identified: Find Help in My Community.   Priority: High     Goal: Find Help in My Community.   Start Date: 07/30/2021  Expected End Date: 09/29/2021  This Visit's Progress: On track  Priority: High  Note:   Current Barriers:   Financial constraints related to low - Social Security Income, and only source of income.   Limited - social, family and caregiver support, and social isolation.   Transportation limited - inability to afford to replace transmission in vehicle, and inability to afford fees associated with public transportation, such as [employer]). Housing constraints - 1988 3-bedroom mobile home has holes in the floors in every room, except 2 (living room and half bathroom), mold and mildew throughout. Lacks knowledge of available community agencies and resources in [place].   Clinical Goals:  Patient will work with LCSW and [place] [employer] to address needs related to housing, financial and transportation insecurities.   Clinical Interventions:  Patient interviewed and appropriate assessments performed. Collaboration with Primary  Care Physician, Dr. Reinaldo Meeker regarding development and update of comprehensive plan of care as evidenced by provider attestation and co-signature. Inter-disciplinary care team collaboration (see longitudinal plan of care). Assessment of needs, barriers, agencies contacted, as well as how impacting. Explained referral process and obtained verbal consent from patient to place order for [place] assistance, with regards to obtaining all available  housing, financial and transportation resources. Reviewed various housing, housing repair, transportation and financial resources in [place], and discussed options with patient. Discussed plans with patient for ongoing care management follow-up and provided patient with direct contact information for care management team. Encouraged patient to begin calling list of housing, transportation and financial resources, upon receipt.   Collaboration with representative from the [place] to place a referral for emergency housing. Other Clinical Interventions: PHQ 2 and PHQ 9 Depression Screen performed and results reviewed with patient, Solution-Focused Strategies implemented, Problem Solving/Task-Centered Skills utilized, Motivational Interviewing performed, Quality of Sleep assessed and Sleep Hygiene Techniques promoted,  Patient Goals/Self-Care Activities: Work with [employer] and [employer], on a weekly/bi-weekly basis, to obtain housing, transportation and financial resources and assistance.   Contact LCSW directly (# [phone]) if you have questions, need assistance, or if additional social work needs are identified. Continue to consider applying for [employer], [employer], Section 8 Housing and/or [employer], through the [employer].   Review lists of housing, transportation and financial resources provided, and begin contacting agencies of interest.   International aid/development worker [phone]), for transportation assistance to and from all [place] affiliated facilities and/or providers. Follow-Up Date:  08/14/2021 at 12:30pm

## 2021-07-31 NOTE — Chronic Care Management (AMB) (Signed)
Chronic Care Management    Clinical Social Work Note  07/31/2021 Name: Tina Perkins MRN: [id number] DOB: Jun 18, 1941  Tina Perkins is a 80 y.o. year old female who is a primary care patient of [name], MD. The CCM team was consulted to assist the patient with chronic disease management and/or care coordination needs related to: Transportation Needs, [employer], Surveyor, quantity Difficulties, Housing Barriers.   Engaged with patient by telephone for initial visit in response to provider referral for social work chronic care management and care coordination services.   Consent to Services:  The patient was given information about Chronic Care Management services, agreed to services, and gave verbal consent prior to initiation of services.  Please see initial visit note for detailed documentation.   Patient agreed to services and consent obtained.   Assessment: Review of patient past medical history, allergies, medications, and health status, including review of relevant consultants reports was performed today as part of a comprehensive evaluation and provision of chronic care management and care coordination services.     SDOH (Social Determinants of Health) assessments and interventions performed:  SDOH Interventions    Flowsheet Row Most Recent Value  SDOH Interventions   Food Insecurity Interventions Intervention Not Indicated  Financial Strain Interventions Other (Comment)  [Referral to [employer] Care Guides.]  Housing Interventions Other (Comment)  [Referral to [employer] Guides and Housing Resources Provided.]  Intimate Partner Violence Interventions Intervention Not Indicated  Physical Activity Interventions Patient Refused  Stress Interventions Offered [employer] Resources, Provide Counseling, Patient Refused  Social Connections Interventions Patient Refused  Transportation Interventions [facility]        Advanced Directives  Status: See Care Plan for related entries.  CCM Care Plan  Allergies  Allergen Reactions   Sulfa Antibiotics Hives    Outpatient Encounter Medications as of 07/30/2021  Medication Sig   amLODipine (NORVASC) 2.5 MG tablet Take 1 tablet (2.5 mg total) by mouth daily.   meclizine (ANTIVERT) 12.5 MG tablet Take 1 tablet (12.5 mg total) by mouth 3 (three) times daily as needed for dizziness.   nitroGLYCERIN (NITROSTAT) 0.4 MG SL tablet Place 1 tablet (0.4 mg total) under the tongue every 5 (five) minutes as needed for chest pain.   pravastatin (PRAVACHOL) 40 MG tablet Take 1 tablet (40 mg total) by mouth daily.   triamcinolone cream (KENALOG) 0.1 % Apply 1 application topically 2 (two) times daily.   No facility-administered encounter medications on file as of 07/30/2021.    Patient Active Problem List   Diagnosis Date Noted   PAT (paroxysmal atrial tachycardia) (HCC) 09/15/2020   Second degree AV block, Mobitz type I 09/15/2020   Essential hypertension 04/10/2020   Prediabetes 12/28/2019   Mixed hyperlipidemia 12/14/2019   Patent foramen ovale 12/14/2019   Sequelae of cerebral infarction 12/14/2019   History of hepatitis 12/14/2019   Spinal stenosis, lumbar region with neurogenic claudication 02/28/2019   Coronary artery disease involving native coronary artery of native heart without angina pectoris 05/10/2016    Conditions to be addressed/monitored: CAD and HTN.  Corporate treasurer, Limited Social Support, English as a second language teacher, Housing Barriers, Social Isolation, Limited Access to Engineer, structural, and Art gallery manager of [employer].  Care Plan : LCSW Plan of Care  Updates made by Karolee Stamps, LCSW since 07/31/2021 12:00 AM     Problem: Find Help in My Community.   Priority: High     Goal: Find Help in My Community.   Start Date: 07/30/2021  Expected End Date: 09/29/2021  This Visit's Progress: On track  Priority: High  Note:   Current Barriers:   Financial  constraints related to low - Social Security Income, and only source of income.   Limited - social, family and caregiver support, and social isolation.   Transportation limited - inability to afford to replace transmission in vehicle, and inability to afford fees associated with public transportation, such as [employer] Occupational hygienist). Housing constraints - 1988 3-bedroom mobile home has holes in the floors in every room, except 2 (living room and half bathroom), mold and mildew throughout. Lacks knowledge of available community agencies and resources in [place].   Clinical Goals:  Patient will work with LCSW and Community Care Guides to address needs related to housing, financial and transportation insecurities.   Clinical Interventions:  Patient interviewed and appropriate assessments performed. Collaboration with Primary Care Physician, Dr. Brent Bulla regarding development and update of comprehensive plan of care as evidenced by provider attestation and co-signature. Inter-disciplinary care team collaboration (see longitudinal plan of care). Assessment of needs, barriers, agencies contacted, as well as how impacting. Explained referral process and obtained verbal consent from patient to place order for [facility] Guide assistance, with regards to obtaining all available  housing, financial and transportation resources. Reviewed various housing, housing repair, transportation and financial resources in [place], and discussed options with patient. Discussed plans with patient for ongoing care management follow-up and provided patient with direct contact information for care management team. Encouraged patient to begin calling list of housing, transportation and financial resources, upon receipt.   Collaboration with representative from the [facility] Department of Social Services to place a referral for emergency housing. Other Clinical  Interventions: PHQ 2 and PHQ 9 Depression Screen performed and results reviewed with patient, Solution-Focused Strategies implemented, Problem Solving/Task-Centered Skills utilized, Motivational Interviewing performed, Quality of Sleep assessed and Sleep Hygiene Techniques promoted,  Patient Goals/Self-Care Activities: Work with [employer] and [employer], on a weekly/bi-weekly basis, to obtain housing, transportation and financial resources and assistance.   Contact LCSW directly (# [phone]) if you have questions, need assistance, or if additional social work needs are identified. Continue to consider applying for [employer], [employer], Section 8 Housing and/or [employer], through the [employer].   Review lists of housing, transportation and financial resources provided, and begin contacting agencies of interest.   Conservation officer, historic buildings [phone]), for transportation assistance to and from all [facility] affiliated facilities and/or providers. Follow-Up Date:  08/14/2021 at 12:30pm     Danford Bad LCSW Licensed Clinical Social Worker [facility] [phone]

## 2021-08-03 ENCOUNTER — Telehealth: Payer: Self-pay | Admitting: *Deleted

## 2021-08-03 DIAGNOSIS — I10 Essential (primary) hypertension: Secondary | ICD-10-CM

## 2021-08-03 DIAGNOSIS — I2510 Atherosclerotic heart disease of native coronary artery without angina pectoris: Secondary | ICD-10-CM

## 2021-08-03 NOTE — Telephone Encounter (Signed)
   Telephone encounter was:  Unsuccessful.  08/03/2021 Name: Tina Perkins MRN: [id number] DOB: [birth year]  Unsuccessful outbound call made today to assist with:  Transportation Needs , Food Insecurity, and Home Modifications  Outreach Attempt:  1st Attempt  No answer   Alois Cliche -[facility] Guide , Embedded Care Coordination [facility], Care Management  [phone] [address] [place] , [place] [address] Email : [email or web address]

## 2021-08-03 NOTE — Telephone Encounter (Signed)
   Telephone encounter was:  Unsuccessful.  08/03/2021 Name: Tina Perkins MRN: [id number] DOB: 1941-04-28  Unsuccessful outbound call made today to assist with:  Transportation Needs , Food Insecurity, and Home Modifications  Outreach Attempt:  1st Attempt  No mailbox available   Alois Cliche -[facility] Guide , Embedded Care Coordination [facility], Care Management  [phone] [address] [place] , [place] [address] Email : [email or web address]

## 2021-08-06 ENCOUNTER — Telehealth: Payer: Self-pay | Admitting: *Deleted

## 2021-08-06 NOTE — Telephone Encounter (Signed)
   Telephone encounter was:  Successful.  08/06/2021 Name: Tina Perkins MRN: [id number] DOB: 1941-02-03  Tina Perkins is a 80 y.o. year old female who is a primary care patient of [name], MD . The community resource team was consulted for assistance with Will send via mail Section 8 and housing authority information as well information on [employer] services in [place] area to patient as requested , left number if there are other needs   Care guide performed the following interventions: Patient provided with information about care guide support team and interviewed to confirm resource needs Follow up call placed to community resources to determine status of patients referral.  Follow Up Plan:  No further follow up planned at this time. The patient has been provided with needed resources. Alois Cliche -[facility] Guide , Embedded Care Coordination [facility], Care Management  [phone] [address] [place] , [place] [address] Email : [email or web address]

## 2021-08-07 ENCOUNTER — Telehealth: Payer: Self-pay

## 2021-08-07 NOTE — Chronic Care Management (AMB) (Signed)
    Chronic Care Management Pharmacy Assistant   Name: Tina Perkins  MRN: [id number] DOB: 02-Aug-1941  Reason for Encounter: Chart review for CPP visit on 08-13-2021  Recent office visits:  07-30-2021 [name] (CCM)  07-28-2021 [name], MD. STOP lotrisone cream. START Pravastatin 40 mg daily and Kenelog cream twice daily. Trig= 196. A1C= 6.6. Glucose= 151.  Recent consult visits:  03-27-2021 [facility]. Home visit.  02-12-2021 [name], DO (Cardiology). Completed course of vitamin D.  Hospital visits:  None in previous 6 months  Medications: Outpatient Encounter Medications as of 08/07/2021  Medication Sig   amLODipine (NORVASC) 2.5 MG tablet Take 1 tablet (2.5 mg total) by mouth daily.   meclizine (ANTIVERT) 12.5 MG tablet Take 1 tablet (12.5 mg total) by mouth 3 (three) times daily as needed for dizziness.   nitroGLYCERIN (NITROSTAT) 0.4 MG SL tablet Place 1 tablet (0.4 mg total) under the tongue every 5 (five) minutes as needed for chest pain.   pravastatin (PRAVACHOL) 40 MG tablet Take 1 tablet (40 mg total) by mouth daily.   triamcinolone cream (KENALOG) 0.1 % Apply 1 application topically 2 (two) times daily.   No facility-administered encounter medications on file as of 08/07/2021.   Have you seen any other providers since your last visit? Patient stated no  Any changes in your medications or health? Patient stated no  Any side effects from any medications? Patient stated no  Do you have an symptoms or problems not managed by your medications? Patient stated no  Any concerns about your health right now? Patient states she wants to feel better.  Has your provider asked that you check blood pressure, blood sugar, or follow special diet at home? Patient states no  Do you get any type of exercise on a regular basis? Patient states not much due to her weight and breathing.  Can you think of a goal you  would like to reach for your health? Patient states she would like to lose weight at least 50 pounds.  Do you have any problems getting your medications? Patient stated no  Is there anything that you would like to discuss during the appointment? Patient stated nothing comes to mind.  Please bring medications and supplements to appointment  Care Gaps: Tdap overdue Shingrix overdue Dexascan overdue AWV 06-18-2021 no show  Star Rating Drugs: Pravastatin 40 mg- Last filled 07-28-2021 90 DS  [facility] CMA Clinical Pharmacist Assistant [phone]

## 2021-08-11 ENCOUNTER — Ambulatory Visit (INDEPENDENT_AMBULATORY_CARE_PROVIDER_SITE_OTHER): Payer: Medicare HMO

## 2021-08-11 ENCOUNTER — Telehealth: Payer: Self-pay

## 2021-08-11 VITALS — BP 142/82

## 2021-08-11 DIAGNOSIS — I10 Essential (primary) hypertension: Secondary | ICD-10-CM | POA: Diagnosis not present

## 2021-08-11 NOTE — Telephone Encounter (Signed)
Patient came in for 2 week BP recheck today and her BP was 142/82. Previously 2 weeks ago patient's BP reading was 146/80. Patient also stated that she wanted to let Dr. Marina Goodell know that she is still having the Gas bubbling pain that she had told him about two weeks ago which is starting on the right side of the chest and moves up her neck and it happens at night time. She wanted to make sure that you were aware and that she was still having it and she called the gas bubbling pain. LA

## 2021-08-11 NOTE — Progress Notes (Signed)
Patient came in per Dr. Marina Goodell for a 2 week nurse visit for blood pressure recheck.

## 2021-08-11 NOTE — Telephone Encounter (Signed)
Bp ok, refer patient to GI for stomch workup.

## 2021-08-12 ENCOUNTER — Telehealth: Payer: Self-pay

## 2021-08-12 NOTE — Progress Notes (Signed)
I spoke with pt and reminded her of her telephone visit with CPP tomorrow at 2:00 pm . Pt confirmed.  Tina Perkins, CMA Clinical Pharmacist Assistant  [phone]

## 2021-08-13 ENCOUNTER — Ambulatory Visit (INDEPENDENT_AMBULATORY_CARE_PROVIDER_SITE_OTHER): Payer: Medicare HMO

## 2021-08-13 ENCOUNTER — Other Ambulatory Visit: Payer: Self-pay

## 2021-08-13 DIAGNOSIS — I2510 Atherosclerotic heart disease of native coronary artery without angina pectoris: Secondary | ICD-10-CM

## 2021-08-13 DIAGNOSIS — R7303 Prediabetes: Secondary | ICD-10-CM

## 2021-08-13 DIAGNOSIS — I471 Supraventricular tachycardia: Secondary | ICD-10-CM

## 2021-08-13 DIAGNOSIS — I10 Essential (primary) hypertension: Secondary | ICD-10-CM

## 2021-08-13 NOTE — Progress Notes (Signed)
Chronic Care Management Pharmacy Note  08/13/2021 Name:  Tina Perkins MRN:  [id number] DOB:  29-Jun-1941  Summary: -Pleasant 80 year old woman presents for initial CCM visit. She is lacking transportation and is very frustrated with her current state of health. She's tired all the time and has gained a lot of weight recently  Recommendations/Changes made from today's visit: -Due to lack of transportation, I'm going to start delivering her meds. Will ask PCP to send new scripts to [facility] -Needs AWV -Patient has gained a lot of weight and she said that her Surgeon in [place] told her this could happen and that if it does, she needed to have a f/u with him. Because she's in [place], this is impossible. Coordinated with Shelle Iron to get AWV and appt with Dr. Henrene Pastor so she could be evaluated  Subjective: Tina Perkins is an 80 y.o. year old female who is a primary patient of Henrene Pastor, Zeb Comfort, MD.  The CCM team was consulted for assistance with disease management and care coordination needs.    Engaged with patient by telephone for initial visit in response to provider referral for pharmacy case management and/or care coordination services.   Consent to Services:  The patient was given the following information about Chronic Care Management services today, agreed to services, and gave verbal consent: 1. CCM service includes personalized support from designated clinical staff supervised by the primary care provider, including individualized plan of care and coordination with other care providers 2. 24/7 contact phone numbers for assistance for urgent and routine care needs. 3. Service will only be billed when office clinical staff spend 20 minutes or more in a month to coordinate care. 4. Only one practitioner may furnish and bill the service in a calendar month. 5.The patient may stop CCM services at any time (effective at the end of the month) by phone call to the office staff. 6. The patient will be  responsible for cost sharing (co-pay) of up to 20% of the service fee (after annual deductible is met). Patient agreed to services and consent obtained.  Patient Care Team: Lillard Anes, MD as PCP - General (Family Medicine) Berniece Salines, DO as PCP - Cardiology (Cardiology) Lane Hacker, [facility] as Pharmacist (Pharmacist) Thana Ates, RN as Case Manager Saporito, Maree Erie, LCSW as Social Worker (Licensed Clinical Social Worker)  Recent office visits:  07-30-2021 Tina Perkins (CCM)   07-28-2021 Lillard Anes, MD. STOP lotrisone cream. START Pravastatin 40 mg daily and Kenelog cream twice daily. Trig= 196. A1C= 6.6. Glucose= 151.   Recent consult visits:  03-27-2021 [facility]. Home visit.   02-12-2021 Berniece Salines, DO (Cardiology). Completed course of vitamin D.   Hospital visits:  None in previous 6 months   Objective:  Lab Results  Component Value Date   CREATININE 0.67 07/28/2021   BUN 17 07/28/2021   GFRNONAA 84 07/31/2020   GFRAA 97 07/31/2020   NA 142 07/28/2021   K 4.8 07/28/2021   CALCIUM 9.7 07/28/2021   CO2 20 07/28/2021   GLUCOSE 151 (H) 07/28/2021    Lab Results  Component Value Date/Time   HGBA1C 6.6 (H) 07/28/2021 09:06 AM   HGBA1C 6.5 (H) 07/31/2020 10:20 AM    Last diabetic Eye exam: No results found for: HMDIABEYEEXA  Last diabetic Foot exam: No results found for: HMDIABFOOTEX   Lab Results  Component Value Date   CHOL 172 07/28/2021   HDL 41  07/28/2021   LDLCALC 97 07/28/2021   TRIG 196 (H) 07/28/2021   CHOLHDL 4.2 07/28/2021    Hepatic Function Latest Ref Rng & Units 07/28/2021 07/31/2020 03/31/2020  Total Protein 6.0 - 8.5 g/dL 6.9 7.0 6.7  Albumin 3.7 - 4.7 g/dL 4.5 4.4 4.1  AST 0 - 40 IU/L 24 20 23   ALT 0 - 32 IU/L 25 23 27   Alk Phosphatase 44 - 121 IU/L 87 90 94  Total Bilirubin 0.0 - 1.2 mg/dL 0.5 0.5 0.4    Lab Results  Component Value Date/Time   TSH 0.372  (L) 07/31/2020 10:20 AM   TSH 0.667 03/31/2020 10:01 AM   FREET4 1.03 08/11/2020 01:46 PM   FREET4 0.96 12/28/2019 10:12 AM    CBC Latest Ref Rng & Units 07/28/2021 07/31/2020 03/31/2020  WBC 3.4 - 10.8 x10E3/uL 5.8 8.8 5.7  Hemoglobin 11.1 - 15.9 g/dL 14.6 14.5 14.7  Hematocrit 34.0 - 46.6 % 43.7 43.7 44.2  Platelets 150 - 450 x10E3/uL 234 252 214    Lab Results  Component Value Date/Time   VD25OH 19.9 (L) 09/15/2020 11:28 AM    Clinical ASCVD: No  The ASCVD Risk score (Arnett DK, et al., 2019) failed to calculate for the following reasons:   The 2019 ASCVD risk score is only valid for ages 79 to 29   The patient has a prior MI or stroke diagnosis    Depression screen [facility] 2/9 07/30/2021 07/28/2021 12/18/2019  Decreased Interest 0 0 2  Down, Depressed, Hopeless 0 0 1  PHQ - 2 Score 0 0 3  Altered sleeping - - 3  Tired, decreased energy - - 3  Change in appetite - - 1  Feeling bad or failure about yourself  - - 1  Trouble concentrating - - 0  Moving slowly or fidgety/restless - - 0  Suicidal thoughts - - 0  PHQ-9 Score - - 11  Difficult doing work/chores - - Somewhat difficult     Other: (CHADS2VASc if Afib, MMRC or CAT for COPD, ACT, DEXA)  Social History   Tobacco Use  Smoking Status Never   Passive exposure: Never  Smokeless Tobacco Never   BP Readings from Last 3 Encounters:  08/11/21 (!) 142/82  07/28/21 (!) 146/80  02/12/21 124/80   Pulse Readings from Last 3 Encounters:  07/28/21 70  02/12/21 88  12/18/20 73   Wt Readings from Last 3 Encounters:  07/28/21 182 lb (82.6 kg)  02/12/21 178 lb 6.4 oz (80.9 kg)  12/18/20 185 lb (83.9 kg)   BMI Readings from Last 3 Encounters:  07/28/21 36.76 kg/m  02/12/21 36.03 kg/m  12/18/20 37.37 kg/m    Assessment/Interventions: Review of patient past medical history, allergies, medications, health status, including review of consultants reports, laboratory and other test data, was performed as part of  comprehensive evaluation and provision of chronic care management services.   SDOH:  (Social Determinants of Health) assessments and interventions performed: Yes  SDOH Screenings   Alcohol Screen: Low Risk    Last Alcohol Screening Score (AUDIT): 0  Depression (PHQ2-9): Low Risk    PHQ-2 Score: 0  Financial Resource Strain: High Risk   Difficulty of Paying Living Expenses: Very hard  Food Insecurity: No Food Insecurity   Worried About Charity fundraiser in the Last Year: Never true   Ran Out of Food in the Last Year: Never true  Housing: High Risk   Last Housing Risk Score: 2  Physical Activity: Inactive  Days of Exercise per Week: 0 days   Minutes of Exercise per Session: 0 min  Social Connections: Socially Isolated   Frequency of Communication with Friends and Family: More than three times a week   Frequency of Social Gatherings with Friends and Family: More than three times a week   Attends Religious Services: Never   Marine scientist or Organizations: No   Attends Archivist Meetings: Never   Marital Status: Widowed  Stress: No Stress Concern Present   Feeling of Stress : Only a little  Tobacco Use: Low Risk    Smoking Tobacco Use: Never   Smokeless Tobacco Use: Never   Passive Exposure: Never  Transportation Needs: Unmet Transportation Needs   Lack of Transportation (Medical): No   Lack of Transportation (Non-Medical): Yes    CCM Care Plan  Allergies  Allergen Reactions   Sulfa Antibiotics Hives    Medications Reviewed Today     Reviewed by Francis Gaines, LCSW (Social Worker) on 07/30/21 at [place] List Status: <None>   Medication Order Taking? Sig Documenting Provider Last Dose Status Informant  amLODipine (NORVASC) 2.5 MG tablet [id number]  Take 1 tablet (2.5 mg total) by mouth daily. Lillard Anes, MD  Active   meclizine (ANTIVERT) 12.5 MG tablet [id number] No Take 1 tablet (12.5 mg total) by mouth 3 (three) times daily as  needed for dizziness. Lillard Anes, MD Taking Active   nitroGLYCERIN (NITROSTAT) 0.4 MG SL tablet [id number] No Place 1 tablet (0.4 mg total) under the tongue every 5 (five) minutes as needed for chest pain. Tobb, Kardie, DO Taking Expired 08/07/20 2359   pravastatin (PRAVACHOL) 40 MG tablet [id number]  Take 1 tablet (40 mg total) by mouth daily. Lillard Anes, MD  Active   triamcinolone cream (KENALOG) 0.1 % [id number]  Apply 1 application topically 2 (two) times daily. Lillard Anes, MD  Active             Patient Active Problem List   Diagnosis Date Noted   PAT (paroxysmal atrial tachycardia) ([place]) 09/15/2020   Second degree AV block, Mobitz type I 09/15/2020   Essential hypertension 04/10/2020   Prediabetes 12/28/2019   Mixed hyperlipidemia 12/14/2019   Patent foramen ovale 12/14/2019   Sequelae of cerebral infarction 12/14/2019   History of hepatitis 12/14/2019   Spinal stenosis, lumbar region with neurogenic claudication 02/28/2019   Coronary artery disease involving native coronary artery of native heart without angina pectoris 05/10/2016     There is no immunization history on file for this patient.  Conditions to be addressed/monitored:  Hypertension and Hyperlipidemia  Care Plan : [place]  Updates made by Lane Hacker, RPH since 08/13/2021 12:00 AM     Problem: Hypertension, Lipids   Priority: High  Onset Date: 08/13/2021     Goal: Disease State Management   Start Date: 08/13/2021  Expected End Date: 08/13/2022  This Visit's Progress: On track  Priority: High  Note:   Current Barriers:  Does not contact provider office for questions/concerns  Pharmacist Clinical Goal(s):  Patient will contact provider office for questions/concerns as evidenced notation of same in electronic health record through collaboration with PharmD and provider.   Interventions: 1:1 collaboration with Lillard Anes, MD regarding  development and update of comprehensive plan of care as evidenced by provider attestation and co-signature Inter-disciplinary care team collaboration (see longitudinal plan of care) Comprehensive medication review performed; medication list updated in electronic medical  record  Hypertension (BP goal <140/90) BP Readings from Last 3 Encounters:  08/11/21 (!) 142/82  07/28/21 (!) 146/80  02/12/21 124/80  -Uncontrolled -Current treatment: Amlodpine 2.7m -Medications previously tried: Metoprolol 2100m(take 1/2)  -Current home readings: Doesn't test -Current dietary habits: "Tries to eat healthy" -Current exercise habits: None, tired just standing -Denies hypotensive/hypertensive symptoms -Educated on Symptoms of hypotension and importance of maintaining adequate hydration; -Counseled to monitor BP at home weekly, document, and provide log at future appointments November 2022: Patient does not like taking medicine. She was told, "Take this BP med and it'll slow your heart down." She got scared because she thought, "How does it know to stop, what if it slows down too much" and she never took it. Spent majority of visit talking about hypotension and S/S  CAD: (LDL goal < 100) The ASCVD Risk score (Arnett DK, et al., 2019) failed to calculate for the following reasons:   The 2019 ASCVD risk score is only valid for ages 4067o 793 The patient has a prior MI or stroke diagnosis Lab Results  Component Value Date   CHOL 172 07/28/2021   CHOL 173 07/31/2020   CHOL 186 03/31/2020   Lab Results  Component Value Date   HDL 41 07/28/2021   HDL 46 07/31/2020   HDL 46 03/31/2020   Lab Results  Component Value Date   LDLCALC 97 07/28/2021   LDLCALC 100 (H) 07/31/2020   LDLCALC 113 (H) 03/31/2020   Lab Results  Component Value Date   TRIG 196 (H) 07/28/2021   TRIG 154 (H) 07/31/2020   TRIG 150 (H) 03/31/2020   Lab Results  Component Value Date   CHOLHDL 4.2 07/28/2021   CHOLHDL 3.8  07/31/2020   CHOLHDL 4.0 03/31/2020  No results found for: LDLDIRECT -Controlled -Current treatment: Pravastatin 4024mSA 56m63medications previously tried: N/A  -Current dietary habits: "Tries to eat healthy" -Current exercise habits: None, tired just standing -Educated on Cholesterol goals;  -Recommended to continue current medication  Misc: -Patient states she's tired all the time and has gained 50 pounds and doesn't know why. She told me that [place] told her that this could be a side effect of the procedure and that she'd need imaging if it happens. I asked her if she told Dr. PerrHenrene Pastor she said no. Plan: Patient needs an AWV, will try to get that and a f/u with Dr. PerrHenrene Pastorshe can be referred to imaging/specialist if needed  Patient Goals/Self-Care Activities Patient will:  - take medications as prescribed as evidenced by patient report and record review  Follow Up Plan: The patient has been provided with contact information for the care management team and has been advised to call with any health related questions or concerns.   CPP F/U: Monthly for refills  [place] Constablearm.D. - [phone]        Medication Assistance: None required.  Patient affirms current coverage meets needs.  Compliance/Adherence/Medication fill history: Care Gaps: Tdap overdue Shingrix overdue Dexascan overdue AWV 06-18-2021 no show   Star Rating Drugs: Pravastatin 40 mg- Last filled 07-28-2021 90 DS  [id number] [address] [id number]: [phone]: [phone] pill box? No - on 2 meds Pt endorses 100% compliance  We discussed: Benefits of medication  synchronization, packaging and delivery as well as enhanced pharmacist oversight with [facility]. Patient decided to: Utilize [facility]  pharmacy for medication synchronization, packaging and delivery Plan: Verbal consent obtained for [facility] enhanced pharmacy services (medication synchronization, adherence packaging, delivery coordination). A medication sync plan was created to allow patient to get all medications delivered once every 30 to 90 days per patient preference. Patient understands they have freedom to choose pharmacy and clinical pharmacist will coordinate care between all prescribers and [facility].  Medication Name                        (please note if Rx is PRN) Prescriber                                                                  (list Provider Name & Phone Number)                                  Timing   Refill Timing Last Fill Date & DS       (if last fill/DS unavailable, list pt.'s quantity on hand) Anticipated next due date    BB B L EM BT   Days Supply   Amlodpine 2.12m QD PHenrene Pastor- [phone]     Cycle Fill 07/28/2021 90.00 10/26/21                          Pravastatin 436mPeHenrene [phone]    Cycle Fill 07/28/2021 90.00 10/26/21    Care Plan and Follow Up Patient Decision:  Patient agrees to Care Plan and Follow-up.  Plan: The patient has been provided with contact information for the care management team and has been advised to call with any health related questions or concerns.   [place] ConstablePharm.D. - - [phone]

## 2021-08-13 NOTE — Patient Instructions (Signed)
Visit Information   Goals Addressed             This Visit's Progress    Manage My Medicine       Timeframe:  Long-Range Goal Priority:  High Start Date:                             Expected End Date:                       Follow Up Date 08/13/21   - call for medicine refill 2 or 3 days before it runs out    Why is this important?   These steps will help you keep on track with your medicines.   Notes:      Track and Manage My Blood Pressure-Hypertension       Timeframe:  Long-Range Goal Priority:  High Start Date:                             Expected End Date:                       Follow Up Date 08/13/22   - choose a place to take my blood pressure (home, clinic or office, retail store) - write blood pressure results in a log or diary    Why is this important?   You won't feel high blood pressure, but it can still hurt your blood vessels.  High blood pressure can cause heart or kidney problems. It can also cause a stroke.  Making lifestyle changes like losing a little weight or eating less salt will help.  Checking your blood pressure at home and at different times of the day can help to control blood pressure.  If the doctor prescribes medicine remember to take it the way the doctor ordered.  Call the office if you cannot afford the medicine or if there are questions about it.     Notes:        Patient Care Plan: LCSW Plan of Care     Problem Identified: Find Help in My Community.   Priority: High     Goal: Find Help in My Community.   Start Date: 07/30/2021  Expected End Date: 09/29/2021  This Visit's Progress: On track  Priority: High  Note:   Current Barriers:   Financial constraints related to low - Social Security Income, and only source of income.   Limited - social, family and caregiver support, and social isolation.   Transportation limited - inability to afford to replace transmission in vehicle, and inability to afford fees associated with  public transportation, such as [employer] Occupational hygienist). Housing constraints - 1988 3-bedroom mobile home has holes in the floors in every room, except 2 (living room and half bathroom), mold and mildew throughout. Lacks knowledge of available community agencies and resources in [place].   Clinical Goals:  Patient will work with LCSW and Community Care Guides to address needs related to housing, financial and transportation insecurities.   Clinical Interventions:  Patient interviewed and appropriate assessments performed. Collaboration with Primary Care Physician, Dr. Brent Bulla regarding development and update of comprehensive plan of care as evidenced by provider attestation and co-signature. Inter-disciplinary care team collaboration (see longitudinal plan of care). Assessment of needs, barriers, agencies contacted, as well as how impacting. Explained referral process  and obtained verbal consent from patient to place order for [facility] Guide assistance, with regards to obtaining all available  housing, financial and transportation resources. Reviewed various housing, housing repair, transportation and financial resources in [place], and discussed options with patient. Discussed plans with patient for ongoing care management follow-up and provided patient with direct contact information for care management team. Encouraged patient to begin calling list of housing, transportation and financial resources, upon receipt.   Collaboration with representative from the [facility] Department of Social Services to place a referral for emergency housing. Other Clinical Interventions: PHQ 2 and PHQ 9 Depression Screen performed and results reviewed with patient, Solution-Focused Strategies implemented, Problem Solving/Task-Centered Skills utilized, Motivational Interviewing performed, Quality of Sleep assessed and Sleep Hygiene Techniques  promoted,  Patient Goals/Self-Care Activities: Work with [employer] and [employer], on a weekly/bi-weekly basis, to obtain housing, transportation and financial resources and assistance.   Contact LCSW directly (# [phone]) if you have questions, need assistance, or if additional social work needs are identified. Continue to consider applying for [employer], [employer], Section 8 Housing and/or [employer], through the [employer].   Review lists of housing, transportation and financial resources provided, and begin contacting agencies of interest.   Conservation officer, historic buildings [phone]), for transportation assistance to and from all [facility] affiliated facilities and/or providers. Follow-Up Date:  08/14/2021 at 12:30pm      Patient Care Plan: CCM Pharmacy Care Plan     Problem Identified: Hypertension, Lipids   Priority: High  Onset Date: 08/13/2021     Goal: Disease State Management   Start Date: 08/13/2021  Expected End Date: 08/13/2022  This Visit's Progress: On track  Priority: High  Note:   Current Barriers:  Does not contact provider office for questions/concerns  Pharmacist Clinical Goal(s):  Patient will contact provider office for questions/concerns as evidenced notation of same in electronic health record through collaboration with PharmD and provider.   Interventions: 1:1 collaboration with [name], MD regarding development and update of comprehensive plan of care as evidenced by provider attestation and co-signature Inter-disciplinary care team collaboration (see longitudinal plan of care) Comprehensive medication review performed; medication list updated in electronic medical record  Hypertension (BP goal <140/90) BP Readings from Last 3 Encounters:  08/11/21 (!) 142/82  07/28/21 (!) 146/80  02/12/21 124/80  -Uncontrolled -Current treatment: Amlodpine 2.5mg  -Medications  previously tried: Metoprolol 25mg  (take 1/2)  -Current home readings: Doesn't test -Current dietary habits: "Tries to eat healthy" -Current exercise habits: None, tired just standing -Denies hypotensive/hypertensive symptoms -Educated on Symptoms of hypotension and importance of maintaining adequate hydration; -Counseled to monitor BP at home weekly, document, and provide log at future appointments November 2022: Patient does not like taking medicine. She was told, "Take this BP med and it'll slow your heart down." She got scared because she thought, "How does it know to stop, what if it slows down too much" and she never took it. Spent majority of visit talking about hypotension and S/S  CAD: (LDL goal < 100) The ASCVD Risk score (Arnett DK, et al., 2019) failed to calculate for the following reasons:   The 2019 ASCVD risk score is only valid for ages 40 to 59   The patient has a prior MI or stroke diagnosis Lab Results  Component Value Date   CHOL 172 07/28/2021   CHOL 173 07/31/2020   CHOL 186 03/31/2020   Lab Results  Component Value Date  HDL 41 07/28/2021   HDL 46 07/31/2020   HDL 46 03/31/2020   Lab Results  Component Value Date   LDLCALC 97 07/28/2021   LDLCALC 100 (H) 07/31/2020   LDLCALC 113 (H) 03/31/2020   Lab Results  Component Value Date   TRIG 196 (H) 07/28/2021   TRIG 154 (H) 07/31/2020   TRIG 150 (H) 03/31/2020   Lab Results  Component Value Date   CHOLHDL 4.2 07/28/2021   CHOLHDL 3.8 07/31/2020   CHOLHDL 4.0 03/31/2020  No results found for: LDLDIRECT -Controlled -Current treatment: Pravastatin 40mg  ASA 81mg  -Medications previously tried: N/A  -Current dietary habits: "Tries to eat healthy" -Current exercise habits: None, tired just standing -Educated on Cholesterol goals;  -Recommended to continue current medication  Misc: -Patient states she's tired all the time and has gained 50 pounds and doesn't know why. She told me that her doctor in  who did her Sx told her that this could be a side effect of the procedure and that she'd need imaging if it happens. I asked her if she told Dr. and she said no. Plan: Patient needs an AWV, will try to get that and a f/u with Dr. [place] so she can be referred to imaging/specialist if needed  Patient Goals/Self-Care Activities Patient will:  - take medications as prescribed as evidenced by patient report and record review  Follow Up Plan: The patient has been provided with contact information for the care management team and has been advised to call with any health related questions or concerns.   CPP F/U: Monthly for refills  Marina Goodell, Pharm.D. Marina Goodell       Ms. Lorenzi was given information about Chronic Care Management services today including:  CCM service includes personalized support from designated clinical staff supervised by her physician, including individualized plan of care and coordination with other care providers 24/7 contact phone numbers for assistance for urgent and routine care needs. Standard insurance, coinsurance, copays and deductibles apply for chronic care management only during months in which we provide at least 20 minutes of these services. Most insurances cover these services at 100%, however patients may be responsible for any copay, coinsurance and/or deductible if applicable. This service may help you avoid the need for more expensive face-to-face services. Only one practitioner may furnish and bill the service in a calendar month. The patient may stop CCM services at any time (effective at the end of the month) by phone call to the office staff.  Patient agreed to services and verbal consent obtained.   The patient verbalized understanding of instructions, educational materials, and care plan provided today and declined offer to receive copy of patient instructions, educational materials, and care plan.  The pharmacy team will reach out to  the patient again over the next 90 days.   - [phone], [facility]

## 2021-08-14 ENCOUNTER — Ambulatory Visit: Payer: Medicare HMO | Admitting: *Deleted

## 2021-08-14 DIAGNOSIS — I2510 Atherosclerotic heart disease of native coronary artery without angina pectoris: Secondary | ICD-10-CM

## 2021-08-14 DIAGNOSIS — I6930 Unspecified sequelae of cerebral infarction: Secondary | ICD-10-CM

## 2021-08-14 DIAGNOSIS — I10 Essential (primary) hypertension: Secondary | ICD-10-CM

## 2021-08-14 DIAGNOSIS — I471 Supraventricular tachycardia: Secondary | ICD-10-CM

## 2021-08-14 DIAGNOSIS — M48062 Spinal stenosis, lumbar region with neurogenic claudication: Secondary | ICD-10-CM

## 2021-08-14 DIAGNOSIS — R7303 Prediabetes: Secondary | ICD-10-CM

## 2021-08-14 DIAGNOSIS — I441 Atrioventricular block, second degree: Secondary | ICD-10-CM

## 2021-08-14 DIAGNOSIS — E782 Mixed hyperlipidemia: Secondary | ICD-10-CM

## 2021-08-14 DIAGNOSIS — I951 Orthostatic hypotension: Secondary | ICD-10-CM

## 2021-08-14 NOTE — Patient Instructions (Signed)
Visit Information  Current Barriers:   Financial constraints related to low - Social Security Income, and only source of income.   Limited - social, family and caregiver support, and social isolation.   Transportation limited - inability to afford to replace transmission in vehicle, and inability to afford fees associated with public transportation, such as [employer] Occupational hygienist). Housing constraints - 1988 3-bedroom mobile home has holes in the floors in every room, except 2 (living room and half bathroom), mold and mildew throughout. Clinical Goals:  Patient will work with LCSW and Community Care Guides to address needs related to housing, financial and transportation insecurities.   Clinical Interventions:  Collaboration with Primary Care Physician, Dr. Brent Bulla regarding development and update of comprehensive plan of care as evidenced by provider attestation and co-signature. Inter-disciplinary care team collaboration (see longitudinal plan of care). Other Clinical Interventions: Solution-Focused Strategies implemented, Problem Solving/Task-Centered Skills utilized, Motivational Interviewing performed,  Patient Goals/Self-Care Activities:  Contact LCSW directly (# [phone]) if you have questions, need assistance, or if additional social work needs are identified. Continue to consider applying for [employer], [employer], Section 8 Housing and/or [employer], through the [employer].   Continued review of housing, transportation and financial resources provided, contacting agencies of interest.   Conservation officer, historic buildings [phone]), for transportation assistance to and from all [facility] affiliated facilities and/or providers. Follow-Up Date:  09/04/2021 at 9:30am    Patient verbalizes understanding of instructions provided today and agrees to view in MyChart.   Telephone follow up  appointment with care management team member scheduled for:  09/04/2021 at 9:30am  Danford Bad LCSW Licensed Clinical Social Worker [facility] [phone]

## 2021-08-14 NOTE — Chronic Care Management (AMB) (Signed)
Chronic Care Management    Clinical Social Work Note  08/14/2021 Name: Tina Perkins MRN: [id number] DOB: 24-Apr-1941  Tina Perkins is a 80 y.o. year old female who is a primary care patient of [name], MD. The CCM team was consulted to assist the patient with chronic disease management and/or care coordination needs related to: Transportation Needs, [employer], Housing Barriers, and Financial Difficulties.   Engaged with patient by telephone for follow-up visit in response to provider referral for social work chronic care management and care coordination services.   Consent to Services:  The patient was given information about Chronic Care Management services, agreed to services, and gave verbal consent prior to initiation of services.  Please see initial visit note for detailed documentation.   Patient agreed to services and consent obtained.   Assessment: Review of patient past medical history, allergies, medications, and health status, including review of relevant consultants reports was performed today as part of a comprehensive evaluation and provision of chronic care management and care coordination services.     SDOH (Social Determinants of Health) assessments and interventions performed:    Advanced Directives Status: Not addressed in this encounter.  CCM Care Plan  Allergies  Allergen Reactions   Sulfa Antibiotics Hives    Outpatient Encounter Medications as of 08/14/2021  Medication Sig   amLODipine (NORVASC) 2.5 MG tablet Take 1 tablet (2.5 mg total) by mouth daily.   meclizine (ANTIVERT) 12.5 MG tablet Take 1 tablet (12.5 mg total) by mouth 3 (three) times daily as needed for dizziness.   nitroGLYCERIN (NITROSTAT) 0.4 MG SL tablet Place 1 tablet (0.4 mg total) under the tongue every 5 (five) minutes as needed for chest pain.   pravastatin (PRAVACHOL) 40 MG tablet Take 1 tablet (40 mg total) by mouth daily.   triamcinolone cream (KENALOG) 0.1 %  Apply 1 application topically 2 (two) times daily.   No facility-administered encounter medications on file as of 08/14/2021.    Patient Active Problem List   Diagnosis Date Noted   PAT (paroxysmal atrial tachycardia) (HCC) 09/15/2020   Second degree AV block, Mobitz type I 09/15/2020   Essential hypertension 04/10/2020   Prediabetes 12/28/2019   Mixed hyperlipidemia 12/14/2019   Patent foramen ovale 12/14/2019   Sequelae of cerebral infarction 12/14/2019   History of hepatitis 12/14/2019   Spinal stenosis, lumbar region with neurogenic claudication 02/28/2019   Coronary artery disease involving native coronary artery of native heart without angina pectoris 05/10/2016    Conditions to be addressed/monitored: CAD and HLD.  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  Care Plan : LCSW Plan of Care  Updates made by Karolee Stamps, LCSW since 08/14/2021 12:00 AM     Problem: Find Help in My Community.   Priority: High     Goal: Find Help in My Community.   Start Date: 07/30/2021  Expected End Date: 09/29/2021  This Visit's Progress: On track  Recent Progress: On track  Priority: High  Note:   Current Barriers:   Financial constraints related to low - Social Security Income, and only source of income.   Limited - social, family and caregiver support, and social isolation.   Transportation limited - inability to afford to replace transmission in vehicle, and inability to afford fees associated with public transportation, such as [employer] Occupational hygienist). Housing constraints - 1988 3-bedroom  mobile home has holes in the floors in every room, except 2 (living room and half bathroom), mold and mildew throughout. Clinical Goals:  Patient will work with LCSW and Community Care  Guides to address needs related to housing, financial and transportation insecurities.   Clinical Interventions:  Collaboration with Primary Care Physician, Dr. Brent Bulla regarding development and update of comprehensive plan of care as evidenced by provider attestation and co-signature. Inter-disciplinary care team collaboration (see longitudinal plan of care). Other Clinical Interventions: Solution-Focused Strategies implemented, Problem Solving/Task-Centered Skills utilized, Motivational Interviewing performed,  Patient Goals/Self-Care Activities:  Contact LCSW directly (# [phone]) if you have questions, need assistance, or if additional social work needs are identified. Continue to consider applying for [employer], [employer], Section 8 Housing and/or [employer], through the [employer].   Continued review of housing, transportation and financial resources provided, contacting agencies of interest.   Conservation officer, historic buildings [phone]), for transportation assistance to and from all [facility] affiliated facilities and/or providers. Follow-Up Date:  09/04/2021 at 9:30am     Danford Bad LCSW Licensed Clinical Social Worker [facility] [phone]

## 2021-08-20 ENCOUNTER — Ambulatory Visit: Payer: Medicare HMO

## 2021-08-20 VITALS — Ht 59.0 in | Wt 182.0 lb

## 2021-08-20 DIAGNOSIS — M48062 Spinal stenosis, lumbar region with neurogenic claudication: Secondary | ICD-10-CM

## 2021-08-20 DIAGNOSIS — I10 Essential (primary) hypertension: Secondary | ICD-10-CM

## 2021-08-20 NOTE — Patient Instructions (Signed)
Visit Information   PATIENT GOALS/PLAN OF CARE:  Care Plan : Hypertension (Adult)  Updates made by Thana Ates, RN since 08/20/2021 12:00 AM     Problem: Hypertension (Hypertension)   Priority: Medium  Onset Date: 08/20/2021  Note:   Objective:  Last practice recorded BP readings:  BP Readings from Last 3 Encounters:  08/11/21 (!) 142/82  07/28/21 (!) 146/80  02/12/21 124/80   Most recent eGFR/CrCl:  Lab Results  Component Value Date   EGFR 88 07/28/2021    No components found for: CRCL Current Barriers:  Patient with a history of hypertension with elevated readings for the last 2 office visits.  Patient reports that she does not self monitor. Has 3 BP cuffs at home. Agrees to self monitor twice a week No advanced directives. Interested in advance directives.  Case Manager Clinical Goal(s):  patient will verbalize understanding of plan for hypertension management patient will attend all scheduled medical appointments: PCP and call ortho for an appointment patient will demonstrate improved health management independence as evidenced by checking blood pressure as directed and notifying PCP if SBP>140 or DBP > 90, taking all medications as prescribe, and adhering to a low sodium diet as discussed. Interventions:  Collaboration with Lillard Anes, MD regarding development and update of comprehensive plan of care as evidenced by provider attestation and co-signature Inter-disciplinary care team collaboration (see longitudinal plan of care) Provided education to patient re: stroke prevention, s/s of heart attack and stroke, DASH diet, complications of uncontrolled blood pressure Reviewed medications with patient and discussed importance of compliance Discussed plans with patient for ongoing care management follow up and provided patient with direct contact information for care management team Advised patient, providing education and rationale, to monitor blood pressure daily  and record, calling PCP for findings outside established parameters.  Reviewed scheduled/upcoming provider appointments including: PCP Mailed advanced directives, reviewed importance of completion Reviewed importance of self monitoring. Mailed [facility] calendar for recording of BP Self-Care Activities/Patient goals: - Self administers medications as prescribed -Attends all scheduled provider appointments -Calls provider office for new concerns, questions, or BP outside discussed parameters -Checks BP and records as discussed -Follows a low sodium diet/DASH diet - check blood pressure 2 times per week and record.  Follow Up Plan: Telephone follow up appointment with care management team member scheduled for:   09/17/2021    Care Plan : Chronic Pain (Adult)  Updates made by Thana Ates, RN since 08/20/2021 12:00 AM     Problem: Chronic Pain Management (Chronic Pain)   Priority: High  Onset Date: 08/20/2021  Note:   Current Barriers:  Patient with chronic pain. Pain level of 9/10 Taking atleast 6 aspirins per day difficulty in performing IADLs independently-Patient reports she is not able to do much. Has to sit down to vacuum. Has to sit down to try to Tina Perkins.  Is currently using her daughters scooter to go to the store because she can not walk in the store.  Reports she is miserable in pain. Poor quality of life due to pain. Reports gaining 50 pounds in the last 5 years due to back pain and being unable to move around much 1 fall 3 months ago Clinical Goal(s):  patient will verbalize understanding of plan for pain management. , patient will attend all scheduled medical appointments: PCP, and patient will use pharmacological and nonpharmacological pain relief strategies as prescribed.  Interventions:  Collaboration with Lillard Anes, MD regarding development and update of comprehensive plan  of care as evidenced by provider attestation and co-signature Pain assessment  performed Medications reviewed Discussed plans with patient for ongoing care management follow up and provided patient with direct contact information for care management team Provided education to patient and/or caregiver about advanced directives Reviewed medications with patient and discussed importance of taking all medications as prescribed. Reviewed my concern for patient taking so much aspirin. Reviewed scheduled/upcoming provider appointments including:  Discussed plans with patient for ongoing care management follow up and provided patient with direct contact information for care management team Encouraged patient to call ortho and make an appointment asap. Reviewed OTC options like muscle rubs.  Patient voices that she has tried everything including: creams, lotions, patches, back brace Reviewed safe use of heating pad.  Mailed education about chronic pain. Reviewed fall prevention.  Patient Goals/Self Care Activities:  Will self-administer medications as prescribed Will attend all scheduled provider appointments Will call pharmacy for medication refills 7 days prior to needed refill date Patient will calls provider office for new concerns or questions Follow Up Plan: Telephone follow up appointment with care management team member scheduled for:  09/17/2021       Consent to CCM Services: Tina Perkins was given information about Chronic Care Management services including:  CCM service includes personalized support from designated clinical staff supervised by her physician, including individualized plan of care and coordination with other care providers 24/7 contact phone numbers for assistance for urgent and routine care needs. Service will only be billed when office clinical staff spend 20 minutes or more in a month to coordinate care. Only one practitioner may furnish and bill the service in a calendar month. The patient may stop CCM services at any time (effective at the end of  the month) by phone call to the office staff. The patient will be responsible for cost sharing (co-pay) of up to 20% of the service fee (after annual deductible is met).  Patient agreed to services and verbal consent obtained.   The patient verbalized understanding of instructions, educational materials, and care plan provided today and agreed to receive a mailed copy of patient instructions, educational materials, and care plan.   Telephone follow up appointment with care management team member scheduled for:  09/17/2021  Tomasa Rand RN, BSN, CEN RN Case Manager - [facility] Mobile: [phone]

## 2021-08-20 NOTE — Chronic Care Management (AMB) (Signed)
Chronic Care Management   CCM RN Visit Note  08/20/2021 Name: Tina Perkins MRN: [id number] DOB: November 01, 1940  Subjective: Tina Perkins is a 80 y.o. year old female who is a primary care patient of Lillard Anes, MD. The care management team was consulted for assistance with disease management and care coordination needs.    Engaged with patient by telephone for initial visit in response to provider referral for case management and/or care coordination services.   Consent to Services:  The patient was given the following information about Chronic Care Management services today, agreed to services, and gave verbal consent: 1. CCM service includes personalized support from designated clinical staff supervised by the primary care provider, including individualized plan of care and coordination with other care providers 2. 24/7 contact phone numbers for assistance for urgent and routine care needs. 3. Service will only be billed when office clinical staff spend 20 minutes or more in a month to coordinate care. 4. Only one practitioner may furnish and bill the service in a calendar month. 5.The patient may stop CCM services at any time (effective at the end of the month) by phone call to the office staff. 6. The patient will be responsible for cost sharing (co-pay) of up to 20% of the service fee (after annual deductible is met). Patient agreed to services and consent obtained.  Patient agreed to services and verbal consent obtained.   Assessment: Review of patient past medical history, allergies, medications, health status, including review of consultants reports, laboratory and other test data, was performed as part of comprehensive evaluation and provision of chronic care management services.   SDOH (Social Determinants of Health) assessments and interventions performed:  SDOH Interventions    Flowsheet Row Most Recent Value  SDOH Interventions   Intimate Partner Violence Interventions  Intervention Not Indicated  Physical Activity Interventions Other (Comments)  [severe pain and patient reports she can not tolerate exercise]        CCM Care Plan  Allergies  Allergen Reactions   Sulfa Antibiotics Hives    Outpatient Encounter Medications as of 08/20/2021  Medication Sig   acetaminophen (TYLENOL) 500 MG tablet Take 500 mg by mouth every 6 (six) hours as needed. Takes  as needed for pain   amLODipine (NORVASC) 2.5 MG tablet Take 1 tablet (2.5 mg total) by mouth daily.   aspirin 325 MG tablet Take 325 mg by mouth every 6 (six) hours as needed. Patient reports taking aspirin 2 tablets in the am, 2 tablets in the afternoon and 2 tablets at bedtime.   meclizine (ANTIVERT) 12.5 MG tablet Take 1 tablet (12.5 mg total) by mouth 3 (three) times daily as needed for dizziness.   pravastatin (PRAVACHOL) 40 MG tablet Take 1 tablet (40 mg total) by mouth daily.   triamcinolone cream (KENALOG) 0.1 % Apply 1 application topically 2 (two) times daily.   nitroGLYCERIN (NITROSTAT) 0.4 MG SL tablet Place 1 tablet (0.4 mg total) under the tongue every 5 (five) minutes as needed for chest pain.   No facility-administered encounter medications on file as of 08/20/2021.    Patient Active Problem List   Diagnosis Date Noted   PAT (paroxysmal atrial tachycardia) ([facility]) 09/15/2020   Second degree AV block, Mobitz type I 09/15/2020   Essential hypertension 04/10/2020   Prediabetes 12/28/2019   Mixed hyperlipidemia 12/14/2019   Patent foramen ovale 12/14/2019   Sequelae of cerebral infarction 12/14/2019   History of hepatitis 12/14/2019   Spinal stenosis, lumbar region with neurogenic  claudication 02/28/2019   Coronary artery disease involving native coronary artery of native heart without angina pectoris 05/10/2016    Conditions to be addressed/monitored:HTN, HLD, and chronic back pain, no advanced directives  Care Plan : Hypertension (Adult)  Updates made by Thana Ates, RN since  08/20/2021 12:00 AM     Problem: Hypertension (Hypertension)   Priority: Medium  Onset Date: 08/20/2021  Note:   Objective:  Last practice recorded BP readings:  BP Readings from Last 3 Encounters:  08/11/21 (!) 142/82  07/28/21 (!) 146/80  02/12/21 124/80   Most recent eGFR/CrCl:  Lab Results  Component Value Date   EGFR 88 07/28/2021    No components found for: CRCL Current Barriers:  Patient with a history of hypertension with elevated readings for the last 2 office visits.  Patient reports that she does not self monitor. Has 3 BP cuffs at home. Agrees to self monitor twice a week No advanced directives. Interested in advance directives.  Case Manager Clinical Goal(s):  patient will verbalize understanding of plan for hypertension management patient will attend all scheduled medical appointments: PCP and call ortho for an appointment patient will demonstrate improved health management independence as evidenced by checking blood pressure as directed and notifying PCP if SBP>140 or DBP > 90, taking all medications as prescribe, and adhering to a low sodium diet as discussed. Interventions:  Collaboration with Lillard Anes, MD regarding development and update of comprehensive plan of care as evidenced by provider attestation and co-signature Inter-disciplinary care team collaboration (see longitudinal plan of care) Provided education to patient re: stroke prevention, s/s of heart attack and stroke, DASH diet, complications of uncontrolled blood pressure Reviewed medications with patient and discussed importance of compliance Discussed plans with patient for ongoing care management follow up and provided patient with direct contact information for care management team Advised patient, providing education and rationale, to monitor blood pressure daily and record, calling PCP for findings outside established parameters.  Reviewed scheduled/upcoming provider appointments  including: PCP Mailed advanced directives, reviewed importance of completion Reviewed importance of self monitoring. Mailed [facility] calendar for recording of BP Self-Care Activities/Patient goals: - Self administers medications as prescribed -Attends all scheduled provider appointments -Calls provider office for new concerns, questions, or BP outside discussed parameters -Checks BP and records as discussed -Follows a low sodium diet/DASH diet - check blood pressure 2 times per week and record.  Follow Up Plan: Telephone follow up appointment with care management team member scheduled for:   09/17/2021    Care Plan : Chronic Pain (Adult)  Updates made by Thana Ates, RN since 08/20/2021 12:00 AM     Problem: Chronic Pain Management (Chronic Pain)   Priority: High  Onset Date: 08/20/2021  Note:   Current Barriers:  Patient with chronic pain. Pain level of 9/10 Taking atleast 6 aspirins per day difficulty in performing IADLs independently-Patient reports she is not able to do much. Has to sit down to vacuum. Has to sit down to try to Tina Perkins.  Is currently using her daughters scooter to go to the store because she can not walk in the store.  Reports she is miserable in pain. Poor quality of life due to pain. Reports gaining 50 pounds in the last 5 years due to back pain and being unable to move around much 1 fall 3 months ago Clinical Goal(s):  patient will verbalize understanding of plan for pain management. , patient will attend all scheduled medical appointments: PCP, and patient  will use pharmacological and nonpharmacological pain relief strategies as prescribed.  Interventions:  Collaboration with Lillard Anes, MD regarding development and update of comprehensive plan of care as evidenced by provider attestation and co-signature Pain assessment performed Medications reviewed Discussed plans with patient for ongoing care management follow up and provided patient with direct  contact information for care management team Provided education to patient and/or caregiver about advanced directives Reviewed medications with patient and discussed importance of taking all medications as prescribed. Reviewed my concern for patient taking so much aspirin. Reviewed scheduled/upcoming provider appointments including:  Discussed plans with patient for ongoing care management follow up and provided patient with direct contact information for care management team Encouraged patient to call ortho and make an appointment asap. Reviewed OTC options like muscle rubs.  Patient voices that she has tried everything including: creams, lotions, patches, back brace Reviewed safe use of heating pad.  Mailed education about chronic pain. Reviewed fall prevention.  Patient Goals/Self Care Activities:  Will self-administer medications as prescribed Will attend all scheduled provider appointments Will call pharmacy for medication refills 7 days prior to needed refill date Patient will calls provider office for new concerns or questions Follow Up Plan: Telephone follow up appointment with care management team member scheduled for:  09/17/2021       Plan:Telephone follow up appointment with care management team member scheduled for:  09/17/2021 Tomasa Rand RN, BSN, CEN RN Case Manager - [facility] [employer]/gallery exhibitions officer Mobile: [phone]

## 2021-08-21 ENCOUNTER — Telehealth: Payer: Self-pay

## 2021-08-21 ENCOUNTER — Other Ambulatory Visit: Payer: Self-pay

## 2021-08-21 DIAGNOSIS — M48062 Spinal stenosis, lumbar region with neurogenic claudication: Secondary | ICD-10-CM

## 2021-08-21 NOTE — Telephone Encounter (Signed)
LM for patient to return call to schedule appointment with Dr Marina Goodell about weight gain and tiredness.

## 2021-08-21 NOTE — Telephone Encounter (Signed)
-----   Message from Tina Perkins, [facility] sent at 08/13/2021  2:49 PM EST ----- Patient states she's tired all the time and has gained 50 pounds and doesn't know why. She told me that her doctor in [place] who did her Sx told her that this could be a side effect of the procedure and that she'd need imaging if it happens. I asked her if she told Dr. Marina Goodell and she said no.   -Could we get an AWV for her AND a f/u with Dr. Marina Goodell to go over this? She has no transportation so if we can do a Phone visit OR both at the same time, that'd be bets. Thanks and I hope you have a lovely day!

## 2021-09-02 DIAGNOSIS — I2510 Atherosclerotic heart disease of native coronary artery without angina pectoris: Secondary | ICD-10-CM | POA: Diagnosis not present

## 2021-09-02 DIAGNOSIS — E782 Mixed hyperlipidemia: Secondary | ICD-10-CM

## 2021-09-02 DIAGNOSIS — I119 Hypertensive heart disease without heart failure: Secondary | ICD-10-CM

## 2021-09-04 ENCOUNTER — Ambulatory Visit (INDEPENDENT_AMBULATORY_CARE_PROVIDER_SITE_OTHER): Payer: Medicare HMO | Admitting: *Deleted

## 2021-09-04 DIAGNOSIS — I2510 Atherosclerotic heart disease of native coronary artery without angina pectoris: Secondary | ICD-10-CM

## 2021-09-04 DIAGNOSIS — I10 Essential (primary) hypertension: Secondary | ICD-10-CM

## 2021-09-04 DIAGNOSIS — I471 Supraventricular tachycardia: Secondary | ICD-10-CM

## 2021-09-04 DIAGNOSIS — I6930 Unspecified sequelae of cerebral infarction: Secondary | ICD-10-CM

## 2021-09-04 DIAGNOSIS — I441 Atrioventricular block, second degree: Secondary | ICD-10-CM

## 2021-09-04 DIAGNOSIS — R7303 Prediabetes: Secondary | ICD-10-CM

## 2021-09-04 DIAGNOSIS — E782 Mixed hyperlipidemia: Secondary | ICD-10-CM

## 2021-09-05 NOTE — Chronic Care Management (AMB) (Signed)
Chronic Care Management    Clinical Social Work Note  09/05/2021 Name: Tina Perkins MRN: [id number] DOB: 14-Sep-1941  Tina Perkins is a 80 y.o. year old female who is a primary care patient of [name], MD. The CCM team was consulted to assist the patient with chronic disease management and/or care coordination needs related to: [employer], Research officer, trade union, and Housing Barriers.   Engaged with patient by telephone for follow up visit in response to provider referral for social work chronic care management and care coordination services.   Consent to Services:  The patient was given information about Chronic Care Management services, agreed to services, and gave verbal consent prior to initiation of services.  Please see initial visit note for detailed documentation.   Patient agreed to services and consent obtained.   Assessment: Review of patient past medical history, allergies, medications, and health status, including review of relevant consultants reports was performed today as part of a comprehensive evaluation and provision of chronic care management and care coordination services.     SDOH (Social Determinants of Health) assessments and interventions performed:    Advanced Directives Status: Not addressed in this encounter.  CCM Care Plan  Allergies  Allergen Reactions   Sulfa Antibiotics Hives    Outpatient Encounter Medications as of 09/04/2021  Medication Sig   acetaminophen (TYLENOL) 500 MG tablet Take 500 mg by mouth every 6 (six) hours as needed. Takes  as needed for pain   amLODipine (NORVASC) 2.5 MG tablet Take 1 tablet (2.5 mg total) by mouth daily.   aspirin 325 MG tablet Take 325 mg by mouth every 6 (six) hours as needed. Patient reports taking aspirin 2 tablets in the am, 2 tablets in the afternoon and 2 tablets at bedtime.   meclizine (ANTIVERT) 12.5 MG tablet Take 1 tablet (12.5 mg total) by mouth 3 (three) times daily as needed for  dizziness.   nitroGLYCERIN (NITROSTAT) 0.4 MG SL tablet Place 1 tablet (0.4 mg total) under the tongue every 5 (five) minutes as needed for chest pain.   pravastatin (PRAVACHOL) 40 MG tablet Take 1 tablet (40 mg total) by mouth daily.   triamcinolone cream (KENALOG) 0.1 % Apply 1 application topically 2 (two) times daily.   No facility-administered encounter medications on file as of 09/04/2021.    Patient Active Problem List   Diagnosis Date Noted   PAT (paroxysmal atrial tachycardia) ([facility]) 09/15/2020   Second degree AV block, Mobitz type I 09/15/2020   Essential hypertension 04/10/2020   Prediabetes 12/28/2019   Mixed hyperlipidemia 12/14/2019   Patent foramen ovale 12/14/2019   Sequelae of cerebral infarction 12/14/2019   History of hepatitis 12/14/2019   Spinal stenosis, lumbar region with neurogenic claudication 02/28/2019   Coronary artery disease involving native coronary artery of native heart without angina pectoris 05/10/2016    Conditions to be addressed/monitored: HTN and HLD.  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  Care Plan : LCSW Plan of Care  Updates made by Karolee Stamps, LCSW since 09/05/2021 12:00 AM     Problem: Find Help in My Community.   Priority: High     Goal: Find Help in My Community.   Start Date: 07/30/2021  Expected End Date: 10/12/2021  This Visit's Progress: On track  Recent Progress: On track  Priority: High  Note:   Current Barriers:   Financial constraints related to low - Social Security Income, and only source of income.  Limited - social, family and caregiver support, and social isolation.   Transportation limited - inability to afford to replace transmission in vehicle, and inability to afford fees associated with public transportation, such as [employer] Occupational hygienist). Housing constraints - 1988 3-bedroom mobile home has  holes in the floors in every room, except 2 (living room and half bathroom), mold and mildew throughout. Clinical Goals:  Patient will work with LCSW and Community Care Guides to address needs related to housing, financial and transportation insecurities.   Clinical Interventions:  Collaboration with Primary Care Physician, Dr. Brent Bulla regarding development and update of comprehensive plan of care as evidenced by provider attestation and co-signature. Inter-disciplinary care team collaboration (see longitudinal plan of care). Other Clinical Interventions: Solution-Focused Strategies implemented, Problem Solving/Task-Centered Skills utilized, Motivational Interviewing performed,  Patient Goals/Self-Care Activities:  Contact LCSW directly (# [phone]) if you have questions, need assistance, or if additional social work needs are identified. Await response from [employer] and [employer], as to whether or not you have been approved for housing, and placed on the waiting list.   Remain on waiting list for Section 8 Housing and [employer], through the [employer].   Continue to utilize [employer] [phone] [phone]), for transportation assistance to and from all [facility] affiliated facilities and/or providers. Follow-Up Date:  10/12/2021 at 10:00 am     Danford Bad LCSW Licensed Clinical Social Worker [facility] [phone]

## 2021-09-05 NOTE — Patient Instructions (Signed)
Visit Information  Thank you for taking time to visit with me today. Please don't hesitate to contact me if I can be of assistance to you before our next scheduled telephone appointment.  Following are the goals we discussed today:   Patient Goals/Self-Care Activities:  Contact LCSW directly (# [phone]) if you have questions, need assistance, or if additional social work needs are identified. Await response from [employer] and [employer], as to whether or not you have been approved for housing, and placed on the waiting list.   Remain on waiting list for Section 8 Housing and [employer], through the [employer].   Continue to utilize [employer] [phone]), for transportation assistance to and from all [facility] affiliated facilities and/or providers.  Our next appointment is by telephone on 10/12/2021 at 10:00 am.  Please call the care guide team at [phone] if you need to cancel or reschedule your appointment.   If you are experiencing a Mental Health or Behavioral Health Crisis or need someone to talk to, please call the Suicide and Crisis Lifeline: 988 call the [place] National Suicide Prevention Lifeline: 405-169-4741 or TTY: 270-009-0395 TTY (408)098-9506) to talk to a trained counselor call 1-800-273-TALK (toll free, 24 hour hotline) go to [facility] [address], [place] [phone]) call the [facility] Crisis Line: 7805278197 call 911   Patient verbalizes understanding of instructions provided today and agrees to view in MyChart.   Danford Bad LCSW Licensed Clinical Social Worker [facility] [phone]

## 2021-09-15 DIAGNOSIS — M5441 Lumbago with sciatica, right side: Secondary | ICD-10-CM | POA: Diagnosis not present

## 2021-09-15 DIAGNOSIS — M4722 Other spondylosis with radiculopathy, cervical region: Secondary | ICD-10-CM | POA: Diagnosis not present

## 2021-09-15 DIAGNOSIS — M4802 Spinal stenosis, cervical region: Secondary | ICD-10-CM | POA: Diagnosis not present

## 2021-09-15 DIAGNOSIS — G8929 Other chronic pain: Secondary | ICD-10-CM | POA: Insufficient documentation

## 2021-09-15 DIAGNOSIS — R030 Elevated blood-pressure reading, without diagnosis of hypertension: Secondary | ICD-10-CM | POA: Diagnosis not present

## 2021-09-15 DIAGNOSIS — Z0189 Encounter for other specified special examinations: Secondary | ICD-10-CM | POA: Diagnosis not present

## 2021-09-15 DIAGNOSIS — M5442 Lumbago with sciatica, left side: Secondary | ICD-10-CM | POA: Diagnosis not present

## 2021-09-15 DIAGNOSIS — IMO0001 Reserved for inherently not codable concepts without codable children: Secondary | ICD-10-CM

## 2021-09-15 DIAGNOSIS — M542 Cervicalgia: Secondary | ICD-10-CM

## 2021-09-15 DIAGNOSIS — M50122 Cervical disc disorder at C5-C6 level with radiculopathy: Secondary | ICD-10-CM | POA: Diagnosis not present

## 2021-09-15 DIAGNOSIS — Z0289 Encounter for other administrative examinations: Secondary | ICD-10-CM | POA: Insufficient documentation

## 2021-09-15 HISTORY — DX: Cervicalgia: M54.2

## 2021-09-15 HISTORY — DX: Reserved for inherently not codable concepts without codable children: IMO0001

## 2021-09-15 HISTORY — DX: Other chronic pain: G89.29

## 2021-09-15 HISTORY — DX: Encounter for other administrative examinations: Z02.89

## 2021-09-17 ENCOUNTER — Ambulatory Visit: Payer: Medicare HMO

## 2021-09-17 VITALS — BP 136/80

## 2021-09-17 DIAGNOSIS — M48062 Spinal stenosis, lumbar region with neurogenic claudication: Secondary | ICD-10-CM

## 2021-09-17 DIAGNOSIS — I10 Essential (primary) hypertension: Secondary | ICD-10-CM

## 2021-09-17 NOTE — Patient Instructions (Signed)
Visit Information  Thank you for taking time to visit with me today. Please don't hesitate to contact me if I can be of assistance to you before our next scheduled telephone appointment.  Following are the goals we discussed today:   Patient Goals/Self-Care Activities: Take all medications as prescribed Attend all scheduled provider appointments Call pharmacy for medication refills 3-7 days in advance of running out of medications Call provider office for new concerns or questions  Work with the social worker to address care coordination needs and will continue to work with the clinical team to address health care and disease management related needs call the Suicide and Crisis Lifeline: 988 call the [place] National Suicide Prevention Lifeline: (952)229-1781 or TTY: 773-801-1208 TTY 206 434 2520) to talk to a trained counselor call 1-800-273-TALK (toll free, 24 hour hotline) call 911 if experiencing a Mental Health or Behavioral Health Crisis  check blood pressure daily choose a place to take my blood pressure (home, clinic or office, retail store) write blood pressure results in a log or diary keep a blood pressure log take blood pressure log to all doctor appointments call doctor for signs and symptoms of high blood pressure keep all doctor appointments take medications for blood pressure exactly as prescribed report new symptoms to your doctor  Our next appointment is by telephone on 11/19/2021 at 1:30pm  Please call the care guide team at [phone] if you need to cancel or reschedule your appointment.   If you are experiencing a Mental Health or Behavioral Health Crisis or need someone to talk to, please call the Suicide and Crisis Lifeline: 988 call the [place] National Suicide Prevention Lifeline: 802-385-1641 or TTY: 817-490-5535 TTY (719) 297-2298) to talk to a trained counselor call 1-800-273-TALK (toll free, 24 hour hotline) call 911   The patient verbalized  understanding of instructions, educational materials, and care plan provided today and agreed to receive a mailed copy of patient instructions, educational materials, and care plan.   Rowe Pavy RN, BSN, CEN RN Case Production designer, theatre/television/film - [facility] Mobile: [phone]

## 2021-09-17 NOTE — Chronic Care Management (AMB) (Addendum)
Chronic Care Management   CCM RN Visit Note  09/17/2021 Name: Tina Perkins MRN: [id number] DOB: 1940-12-16  Subjective: Tina Perkins is a 80 y.o. year old female who is a primary care patient of Tina Anes, MD. The care management team was consulted for assistance with disease management and care coordination needs.    Engaged with patient by telephone for follow up visit in response to provider referral for case management and/or care coordination services.   Consent to Services:  The patient was given information about Chronic Care Management services, agreed to services, and gave verbal consent prior to initiation of services.  Please see initial visit note for detailed documentation.   Patient agreed to services and verbal consent obtained.   Assessment: Review of patient past medical history, allergies, medications, health status, including review of consultants reports, laboratory and other test data, was performed as part of comprehensive evaluation and provision of chronic care management services.   SDOH (Social Determinants of Health) assessments and interventions performed:    CCM Care Plan  Allergies  Allergen Reactions   Sulfa Antibiotics Hives    Outpatient Encounter Medications as of 09/17/2021  Medication Sig   acetaminophen (TYLENOL) 500 MG tablet Take 500 mg by mouth every 6 (six) hours as needed. Takes  as needed for pain   amLODipine (NORVASC) 2.5 MG tablet Take 1 tablet (2.5 mg total) by mouth daily.   aspirin 325 MG tablet Take 325 mg by mouth every 6 (six) hours as needed. Patient reports taking aspirin 2 tablets in the am, 2 tablets in the afternoon and 2 tablets at bedtime.   meclizine (ANTIVERT) 12.5 MG tablet Take 1 tablet (12.5 mg total) by mouth 3 (three) times daily as needed for dizziness.   nitroGLYCERIN (NITROSTAT) 0.4 MG SL tablet Place 1 tablet (0.4 mg total) under the tongue every 5 (five) minutes as needed for chest pain.    pravastatin (PRAVACHOL) 40 MG tablet Take 1 tablet (40 mg total) by mouth daily.   triamcinolone cream (KENALOG) 0.1 % Apply 1 application topically 2 (two) times daily.   No facility-administered encounter medications on file as of 09/17/2021.    Patient Active Problem List   Diagnosis Date Noted   PAT (paroxysmal atrial tachycardia) ([place]) 09/15/2020   Second degree AV block, Mobitz type I 09/15/2020   Essential hypertension 04/10/2020   Prediabetes 12/28/2019   Mixed hyperlipidemia 12/14/2019   Patent foramen ovale 12/14/2019   Sequelae of cerebral infarction 12/14/2019   History of hepatitis 12/14/2019   Spinal stenosis, lumbar region with neurogenic claudication 02/28/2019   Coronary artery disease involving native coronary artery of native heart without angina pectoris 05/10/2016    Conditions to be addressed/monitored:HTN and chronic pain      Care Plan : RN Care manager plan of care  Updates made by Thana Ates, RN since 09/17/2021 12:00 AM     Problem: No plan of care established for chronic disease states (Hypertension, chronic pain)   Priority: High  Onset Date: 09/17/2021     Long-Range Goal: Development of plan of care for chonic disease management ( hypertension and chronic pain)   Start Date: 09/17/2021  Expected End Date: 09/17/2022  Priority: High  Note:   Current Barriers:  Chronic Disease Management support and education needs related to HTN and no advanced directives, chronic pain   09/17/2021  Patient reports that she is self monitoring her blood pressure. Reports range of 136-140/80.  Reports she is taking her  medications as prescribed. Reports she saw her pain doctor yesterday and is have x rays. Has follow up planned for 1 month. Reports she received her advanced directive packet in the mail but has not completed it.   RNCM Clinical Goal(s):  Patient will take all medications exactly as prescribed and will call provider for medication related  questions as evidenced by patient report of taking his medications correctly attend all scheduled medical appointments: PCP as evidenced by review of office notes continue to work with RN Care Manager to address care management and care coordination needs related to  HTN and chronic pain as evidenced by adherence to CM Team Scheduled appointments through collaboration with RN Care manager, provider, and care team.   Interventions: 1:1 collaboration with primary care provider regarding development and update of comprehensive plan of care as evidenced by provider attestation and co-signature Inter-disciplinary care team collaboration (see longitudinal plan of care) Evaluation of current treatment plan related to  self management and patient's adherence to plan as established by provider   Hypertension Interventions:  (Status:  New goal.) Long Term Goal Last practice recorded BP readings:  BP Readings from Last 3 Encounters:  09/17/21 136/80  08/11/21 (!) 142/82  07/28/21 (!) 146/80  Most recent eGFR/CrCl:  Lab Results  Component Value Date   EGFR 88 07/28/2021    No components found for: CRCL  Evaluation of current treatment plan related to hypertension self management and patient's adherence to plan as established by provider Reviewed medications with patient and discussed importance of compliance Discussed plans with patient for ongoing care management follow up and provided patient with direct contact information for care management team Advised patient, providing education and rationale, to monitor blood pressure daily and record, calling PCP for findings outside established parameters Reviewed scheduled/upcoming provider appointments including:  Discussed complications of poorly controlled blood pressure such as heart disease, stroke, circulatory complications, vision complications, kidney impairment, sexual dysfunction  Pain Interventions:  (Status:  New goal.) Long Term Goal Pain  assessment performed Medications reviewed Reviewed provider established plan for pain management Discussed importance of adherence to all scheduled medical appointments Counseled on the importance of reporting any/all new or changed pain symptoms or management strategies to pain management provider  Patient Goals/Self-Care Activities: Take all medications as prescribed Attend all scheduled provider appointments Call pharmacy for medication refills 3-7 days in advance of running out of medications Call provider office for new concerns or questions  Work with the social worker to address care coordination needs and will continue to work with the clinical team to address health care and disease management related needs call the Suicide and Crisis Lifeline: 988 call the [place] National Suicide Prevention Lifeline: (231) 762-7458 or TTY: (972)220-2583 TTY 2293172823) to talk to a trained counselor call 1-800-273-TALK (toll free, 24 hour hotline) call 911 if experiencing a Mental Health or [place]  check blood pressure daily choose a place to take my blood pressure (home, clinic or office, retail store) write blood pressure results in a log or diary keep a blood pressure log take blood pressure log to all doctor appointments call doctor for signs and symptoms of high blood pressure keep all doctor appointments take medications for blood pressure exactly as prescribed report new symptoms to your doctor  Follow Up Plan:  Telephone follow up appointment with care management team member scheduled for:  11/19/2021  at 1:30pm       Plan:Telephone follow up appointment with care management team member scheduled for:  11/19/2021  at 1:30 pm Tomasa Rand, RN, BSN, CEN [facility] [employer] [phone]

## 2021-10-01 DIAGNOSIS — M4312 Spondylolisthesis, cervical region: Secondary | ICD-10-CM | POA: Diagnosis not present

## 2021-10-01 DIAGNOSIS — G8929 Other chronic pain: Secondary | ICD-10-CM | POA: Diagnosis not present

## 2021-10-01 DIAGNOSIS — M4802 Spinal stenosis, cervical region: Secondary | ICD-10-CM | POA: Diagnosis not present

## 2021-10-01 DIAGNOSIS — M542 Cervicalgia: Secondary | ICD-10-CM | POA: Diagnosis not present

## 2021-10-03 DIAGNOSIS — I10 Essential (primary) hypertension: Secondary | ICD-10-CM

## 2021-10-03 DIAGNOSIS — E782 Mixed hyperlipidemia: Secondary | ICD-10-CM

## 2021-10-03 DIAGNOSIS — I2510 Atherosclerotic heart disease of native coronary artery without angina pectoris: Secondary | ICD-10-CM | POA: Diagnosis not present

## 2021-10-12 ENCOUNTER — Ambulatory Visit (INDEPENDENT_AMBULATORY_CARE_PROVIDER_SITE_OTHER): Payer: Medicare HMO | Admitting: *Deleted

## 2021-10-12 DIAGNOSIS — I951 Orthostatic hypotension: Secondary | ICD-10-CM

## 2021-10-12 DIAGNOSIS — I10 Essential (primary) hypertension: Secondary | ICD-10-CM

## 2021-10-12 DIAGNOSIS — E782 Mixed hyperlipidemia: Secondary | ICD-10-CM

## 2021-10-12 DIAGNOSIS — I441 Atrioventricular block, second degree: Secondary | ICD-10-CM

## 2021-10-12 DIAGNOSIS — I6930 Unspecified sequelae of cerebral infarction: Secondary | ICD-10-CM

## 2021-10-12 DIAGNOSIS — I2510 Atherosclerotic heart disease of native coronary artery without angina pectoris: Secondary | ICD-10-CM

## 2021-10-12 DIAGNOSIS — I471 Supraventricular tachycardia: Secondary | ICD-10-CM

## 2021-10-12 NOTE — Chronic Care Management (AMB) (Signed)
Chronic Care Management    Clinical Social Work Note  10/12/2021 Name: Tina Perkins MRN: [id number] DOB: 01-13-1941  Tina Perkins is a 81 y.o. year old female who is a primary care patient of [name], MD. The CCM team was consulted to assist the patient with chronic disease management and/or care coordination needs related to: [employer] and [employer].   Engaged with patient by telephone for follow up visit in response to provider referral for social work chronic care management and care coordination services.   Consent to Services:  The patient was given information about Chronic Care Management services, agreed to services, and gave verbal consent prior to initiation of services.  Please see initial visit note for detailed documentation.   Patient agreed to services and consent obtained.   Assessment: Review of patient past medical history, allergies, medications, and health status, including review of relevant consultants reports was performed today as part of a comprehensive evaluation and provision of chronic care management and care coordination services.     SDOH (Social Determinants of Health) assessments and interventions performed:    Advanced Directives Status: Not addressed in this encounter.  CCM Care Plan  Allergies  Allergen Reactions   Sulfa Antibiotics Hives    Outpatient Encounter Medications as of 10/12/2021  Medication Sig   acetaminophen (TYLENOL) 500 MG tablet Take 500 mg by mouth every 6 (six) hours as needed. Takes  as needed for pain   amLODipine (NORVASC) 2.5 MG tablet Take 1 tablet (2.5 mg total) by mouth daily.   aspirin 325 MG tablet Take 325 mg by mouth every 6 (six) hours as needed. Patient reports taking aspirin 2 tablets in the am, 2 tablets in the afternoon and 2 tablets at bedtime.   meclizine (ANTIVERT) 12.5 MG tablet Take 1 tablet (12.5 mg total) by mouth 3 (three) times daily as needed for dizziness.    nitroGLYCERIN (NITROSTAT) 0.4 MG SL tablet Place 1 tablet (0.4 mg total) under the tongue every 5 (five) minutes as needed for chest pain.   pravastatin (PRAVACHOL) 40 MG tablet Take 1 tablet (40 mg total) by mouth daily.   triamcinolone cream (KENALOG) 0.1 % Apply 1 application topically 2 (two) times daily.   No facility-administered encounter medications on file as of 10/12/2021.    Patient Active Problem List   Diagnosis Date Noted   PAT (paroxysmal atrial tachycardia) ([facility]) 09/15/2020   Second degree AV block, Mobitz type I 09/15/2020   Essential hypertension 04/10/2020   Prediabetes 12/28/2019   Mixed hyperlipidemia 12/14/2019   Patent foramen ovale 12/14/2019   Sequelae of cerebral infarction 12/14/2019   History of hepatitis 12/14/2019   Spinal stenosis, lumbar region with neurogenic claudication 02/28/2019   Coronary artery disease involving native coronary artery of native heart without angina pectoris 05/10/2016    Conditions to be addressed/monitored: CAD and HTN.  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  Care Plan : LCSW Plan of Care  Updates made by Karolee Stamps, LCSW since 10/12/2021 12:00 AM     Problem: Find Help in My Community.   Priority: High     Goal: Find Help in My Community.   Start Date: 07/30/2021  Expected End Date: 10/27/2021  This Visit's Progress: On track  Recent Progress: On track  Priority: High  Note:   Current Barriers:   Financial constraints related to low - Social Security Income, and only source of income.  Limited - social, family and caregiver support, and social isolation.   Transportation limited - inability to afford to replace transmission in vehicle, and inability to afford fees associated with public transportation, such as [employer] Occupational hygienist). Housing constraints - 1988 3-bedroom mobile home has  holes in the floors in every room, except 2 (living room and half bathroom), mold and mildew throughout. Clinical Goals:  Patient will work with LCSW and Community Care Guides to address needs related to housing, financial and transportation insecurities.   Clinical Interventions:  Collaboration with Primary Care Physician, Dr. Brent Bulla regarding development and update of comprehensive plan of care as evidenced by provider attestation and co-signature. Inter-disciplinary care team collaboration (see longitudinal plan of care). Other Clinical Interventions: Solution-Focused Strategies implemented, Problem Solving/Task-Centered Skills utilized, Motivational Interviewing performed. Mailed the following list of housing resources to patient's home on 10/12/2021:  Permanent Subsidized Housing and Private Landlords Who Accept Section 8 Vouchers. Patient Goals/Self-Care Activities:  Contact LCSW directly (# [phone]) if you have questions, need assistance, or if additional social work needs are identified. Remain on waiting list for Affordable Housing and [employer], through the [employer] [phone]).   ~ Periodically call to check the status of your name on the waiting list.    Remain on waiting list for Section 8 Housing and [employer], through the [employer] [phone]).  ~ Periodically call to check the status of your name on the waiting list.  Begin contacting Permanent Subsidized Housing Resources [phone]), to inquire about availability. Begin contacting Private Landlords Who Accept Section 8 Vouchers [phone]), to inquire about availability. Continue to utilize [employer] [phone]), for transportation assistance to and from all [facility] affiliated facilities and/or provider practices. Follow-Up Date:  10/27/2021 at 3:15 pm     Danford Bad LCSW Licensed Clinical Social  Worker [facility] [phone]

## 2021-10-12 NOTE — Patient Instructions (Signed)
Visit Information  Thank you for taking time to visit with me today. Please don't hesitate to contact me if I can be of assistance to you before our next scheduled telephone appointment.  Following are the goals we discussed today:  Patient Goals/Self-Care Activities:  Contact LCSW directly (# [phone]) if you have questions, need assistance, or if additional social work needs are identified. Remain on waiting list for Affordable Housing and [employer], through the [employer] [phone]).   ~ Periodically call to check the status of your name on the waiting list.    Remain on waiting list for Section 8 Housing and [employer], through the [employer] [phone]).  ~ Periodically call to check the status of your name on the waiting list.  Begin contacting Permanent Subsidized Housing Resources [phone]), to inquire about availability. Begin contacting Private Landlords Who Accept Section 8 Vouchers [phone]), to inquire about availability. Continue to utilize [employer] [phone]), for transportation assistance to and from all [facility] affiliated facilities and/or provider practices. Follow-Up Date:  10/27/2021 at 3:15 pm  Please call the care guide team at [phone] if you need to cancel or reschedule your appointment.   If you are experiencing a Mental Health or Behavioral Health Crisis or need someone to talk to, please call the Suicide and Crisis Lifeline: 988 call the [place] National Suicide Prevention Lifeline: (410)039-0679 or TTY: 704-713-2662 TTY 6516893492) to talk to a trained counselor call 1-800-273-TALK (toll free, 24 hour hotline) go to [facility] [address], [place] [phone]) call the [facility] Crisis Line: [phone] call 911   Patient verbalizes understanding of instructions provided today and agrees to view  in MyChart.   Danford Bad LCSW Licensed Clinical Social Worker [facility] [phone]

## 2021-10-22 ENCOUNTER — Other Ambulatory Visit: Payer: Self-pay

## 2021-10-22 ENCOUNTER — Telehealth: Payer: Self-pay

## 2021-10-22 DIAGNOSIS — E782 Mixed hyperlipidemia: Secondary | ICD-10-CM

## 2021-10-22 DIAGNOSIS — I10 Essential (primary) hypertension: Secondary | ICD-10-CM

## 2021-10-22 MED ORDER — PRAVASTATIN SODIUM 40 MG PO TABS: 40.0000 mg | ORAL_TABLET | Freq: Every day | ORAL | 3 refills | Status: DC

## 2021-10-22 MED ORDER — AMLODIPINE BESYLATE 2.5 MG PO TABS: 2.5000 mg | ORAL_TABLET | Freq: Every day | ORAL | 2 refills | Status: DC

## 2021-10-22 NOTE — Chronic Care Management (AMB) (Signed)
° ° °  Chronic Care Management Pharmacy Assistant   Name: Zareya Tuckett  MRN: [id number] DOB: [birth year]  Reason for Encounter: Medication Coordination   10/22/2021- Request sent to CMA-Clinical poole requesting a 90 DS refill of Amlodipine 2.5 mg and Pravastatin 40 mg to be sent to [facility]. Patient will begin adherence home delivery services.   Medications: Outpatient Encounter Medications as of 10/22/2021  Medication Sig   acetaminophen (TYLENOL) 500 MG tablet Take 500 mg by mouth every 6 (six) hours as needed. Takes  as needed for pain   amLODipine (NORVASC) 2.5 MG tablet Take 1 tablet (2.5 mg total) by mouth daily.   aspirin 325 MG tablet Take 325 mg by mouth every 6 (six) hours as needed. Patient reports taking aspirin 2 tablets in the am, 2 tablets in the afternoon and 2 tablets at bedtime.   meclizine (ANTIVERT) 12.5 MG tablet Take 1 tablet (12.5 mg total) by mouth 3 (three) times daily as needed for dizziness.   nitroGLYCERIN (NITROSTAT) 0.4 MG SL tablet Place 1 tablet (0.4 mg total) under the tongue every 5 (five) minutes as needed for chest pain.   pravastatin (PRAVACHOL) 40 MG tablet Take 1 tablet (40 mg total) by mouth daily.   triamcinolone cream (KENALOG) 0.1 % Apply 1 application topically 2 (two) times daily.   No facility-administered encounter medications on file as of 10/22/2021.   Billee Cashing, CMA Clinical Pharmacist Assistant [phone]

## 2021-10-27 ENCOUNTER — Ambulatory Visit: Payer: Medicare HMO | Admitting: *Deleted

## 2021-10-27 ENCOUNTER — Telehealth: Payer: Medicare HMO

## 2021-10-27 DIAGNOSIS — M48062 Spinal stenosis, lumbar region with neurogenic claudication: Secondary | ICD-10-CM

## 2021-10-27 DIAGNOSIS — I10 Essential (primary) hypertension: Secondary | ICD-10-CM

## 2021-10-27 DIAGNOSIS — I951 Orthostatic hypotension: Secondary | ICD-10-CM

## 2021-10-27 DIAGNOSIS — I441 Atrioventricular block, second degree: Secondary | ICD-10-CM

## 2021-10-27 DIAGNOSIS — I2510 Atherosclerotic heart disease of native coronary artery without angina pectoris: Secondary | ICD-10-CM

## 2021-10-27 DIAGNOSIS — R7303 Prediabetes: Secondary | ICD-10-CM

## 2021-10-27 DIAGNOSIS — I6930 Unspecified sequelae of cerebral infarction: Secondary | ICD-10-CM

## 2021-10-27 DIAGNOSIS — E782 Mixed hyperlipidemia: Secondary | ICD-10-CM

## 2021-10-27 NOTE — Chronic Care Management (AMB) (Signed)
Chronic Care Management    Clinical Social Work Note  10/27/2021 Name: Tina Perkins MRN: [id number] DOB: August 14, 1941  Tina Perkins is a 81 y.o. year old female who is a primary care patient of [name], MD. The CCM team was consulted to assist the patient with chronic disease management and/or care coordination needs related to: Transportation Needs, [employer], Level of Care Concerns, and Financial Difficulties.   Engaged with patient by telephone for follow up visit in response to provider referral for social work chronic care management and care coordination services.   Consent to Services:  The patient was given information about Chronic Care Management services, agreed to services, and gave verbal consent prior to initiation of services.  Please see initial visit note for detailed documentation.   Patient agreed to services and consent obtained.   Assessment: Review of patient past medical history, allergies, medications, and health status, including review of relevant consultants reports was performed today as part of a comprehensive evaluation and provision of chronic care management and care coordination services.     SDOH (Social Determinants of Health) assessments and interventions performed:    Advanced Directives Status: Not addressed in this encounter.  CCM Care Plan  Allergies  Allergen Reactions   Sulfa Antibiotics Hives    Outpatient Encounter Medications as of 10/27/2021  Medication Sig   acetaminophen (TYLENOL) 500 MG tablet Take 500 mg by mouth every 6 (six) hours as needed. Takes  as needed for pain   amLODipine (NORVASC) 2.5 MG tablet Take 1 tablet (2.5 mg total) by mouth daily.   aspirin 325 MG tablet Take 325 mg by mouth every 6 (six) hours as needed. Patient reports taking aspirin 2 tablets in the am, 2 tablets in the afternoon and 2 tablets at bedtime.   meclizine (ANTIVERT) 12.5 MG tablet Take 1 tablet (12.5 mg total) by mouth 3  (three) times daily as needed for dizziness.   nitroGLYCERIN (NITROSTAT) 0.4 MG SL tablet Place 1 tablet (0.4 mg total) under the tongue every 5 (five) minutes as needed for chest pain.   pravastatin (PRAVACHOL) 40 MG tablet Take 1 tablet (40 mg total) by mouth daily.   triamcinolone cream (KENALOG) 0.1 % Apply 1 application topically 2 (two) times daily.   No facility-administered encounter medications on file as of 10/27/2021.    Patient Active Problem List   Diagnosis Date Noted   PAT (paroxysmal atrial tachycardia) ([facility]) 09/15/2020   Second degree AV block, Mobitz type I 09/15/2020   Essential hypertension 04/10/2020   Prediabetes 12/28/2019   Mixed hyperlipidemia 12/14/2019   Patent foramen ovale 12/14/2019   Sequelae of cerebral infarction 12/14/2019   History of hepatitis 12/14/2019   Spinal stenosis, lumbar region with neurogenic claudication 02/28/2019   Coronary artery disease involving native coronary artery of native heart without angina pectoris 05/10/2016    Conditions to be addressed/monitored: HTN and HLD.  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  Care Plan : LCSW Plan of Care  Updates made by Karolee Stamps, LCSW since 10/27/2021 12:00 AM     Problem: Find Help in My Community.   Priority: High     Goal: Find Help in My Community.   Start Date: 07/30/2021  Expected End Date: 12/28/2021  This Visit's Progress: On track  Recent Progress: On track  Priority: High  Note:   Current Barriers:   Financial constraints related to low - Social Security  Income, and only source of income.   Limited - social, family and caregiver support, and social isolation.   Transportation limited - inability to afford to replace transmission in vehicle, and inability to afford fees associated with public transportation, such as [employer] Youth worker). Housing constraints - 1988 3-bedroom mobile home has holes in the floors in every room, except 2 (living room and half bathroom), mold and mildew throughout. Clinical Goals:  Patient will work with LCSW and Community Care Guides to address needs related to housing, financial and transportation insecurities.   Clinical Interventions:  Collaboration with Primary Care Physician, Dr. Brent Bulla regarding development and update of comprehensive plan of care as evidenced by provider attestation and co-signature. Inter-disciplinary care team collaboration (see longitudinal plan of care). Other Clinical Interventions: Solution-Focused Strategies implemented, Problem Solving/Task-Centered Skills utilized, Motivational Interviewing performed. Mailed the following list of housing resources to patient's home on 10/12/2021:  Permanent Subsidized Housing and Private Landlords Who Accept Section 8 Vouchers. Patient Goals/Self-Care Activities:  Work with LCSW on a monthly basis, in an effort to obtain permanent, safe, and affordable housing. Remain on waiting list for Affordable Housing and [employer], through the [employer] [phone]).   ~ Periodically call to check the status of your name on the waiting list.    Remain on waiting list for Section 8 Housing and [employer], through the [employer] [phone]).  ~ Periodically call to check the status of your name on the waiting list.  Begin contacting Permanent Subsidized Housing Resources [phone]), to inquire about availability. Begin contacting Private Landlords Who Accept Section 8 Vouchers [phone]), to inquire about availability. Continue to utilize [employer] [phone]), for transportation assistance to and from all [facility] affiliated facilities and/or provider practices. Contact LCSW directly if you have questions, need assistance, or if  additional social work needs are identified. Follow-Up Date:  Follow-up telephone outreach call scheduled for patient with newly Embedded LCSW, Reece Levy on 11/05/2021 at 2:00 pm.     Danford Bad LCSW Licensed Clinical Social Worker [facility] [phone]

## 2021-10-27 NOTE — Patient Instructions (Signed)
Visit Information  Thank you for taking time to visit with me today. Please don't hesitate to contact me if I can be of assistance to you before our next scheduled telephone appointment.  Following are the goals we discussed today:  Patient Goals/Self-Care Activities:  Work with LCSW on a monthly basis, in an effort to obtain permanent, safe, and affordable housing. Remain on waiting list for Affordable Housing and [employer], through the [employer] [phone]).   ~ Periodically call to check the status of your name on the waiting list.    Remain on waiting list for Section 8 Housing and [employer], through the [employer] [phone]).  ~ Periodically call to check the status of your name on the waiting list.  Begin contacting Permanent Subsidized Housing Resources [phone]), to inquire about availability. Begin contacting Private Landlords Who Accept Section 8 Vouchers [phone]), to inquire about availability. Continue to utilize [employer] [phone]), for transportation assistance to and from all [facility] affiliated facilities and/or provider practices. Contact LCSW directly if you have questions, need assistance, or if additional social work needs are identified. Follow-Up Date:  Follow-up telephone outreach call scheduled for patient with newly Embedded LCSW, Reece Levy on 11/05/2021 at 2:00 pm.  Please call the care guide team at [phone] if you need to cancel or reschedule your appointment.   If you are experiencing a Mental Health or Behavioral Health Crisis or need someone to talk to, please call the Suicide and Crisis Lifeline: 988 call the [place] National Suicide Prevention Lifeline: (272) 859-3615 or TTY: 708 236 5211 TTY [phone]) to talk to a trained counselor call 1-800-273-TALK (toll free, 24 hour hotline) go to [facility] [address] [place] [phone]) call the [facility] Crisis Line: [phone] call 911   Patient verbalizes understanding of instructions and care plan provided today and agrees to view in MyChart. Active MyChart status confirmed with patient.    Danford Bad LCSW Licensed Clinical Social Worker [facility] [phone]

## 2021-10-29 ENCOUNTER — Encounter: Payer: Self-pay | Admitting: Legal Medicine

## 2021-10-29 ENCOUNTER — Other Ambulatory Visit: Payer: Self-pay

## 2021-10-29 ENCOUNTER — Ambulatory Visit (INDEPENDENT_AMBULATORY_CARE_PROVIDER_SITE_OTHER): Payer: Medicare HMO | Admitting: Legal Medicine

## 2021-10-29 VITALS — BP 110/80 | HR 58 | Temp 98.2°F | Resp 16 | Ht 59.0 in | Wt 181.0 lb

## 2021-10-29 DIAGNOSIS — I471 Supraventricular tachycardia: Secondary | ICD-10-CM | POA: Diagnosis not present

## 2021-10-29 DIAGNOSIS — E782 Mixed hyperlipidemia: Secondary | ICD-10-CM

## 2021-10-29 DIAGNOSIS — R7303 Prediabetes: Secondary | ICD-10-CM | POA: Diagnosis not present

## 2021-10-29 DIAGNOSIS — I6930 Unspecified sequelae of cerebral infarction: Secondary | ICD-10-CM | POA: Diagnosis not present

## 2021-10-29 DIAGNOSIS — I10 Essential (primary) hypertension: Secondary | ICD-10-CM

## 2021-10-29 DIAGNOSIS — Z23 Encounter for immunization: Secondary | ICD-10-CM

## 2021-10-29 DIAGNOSIS — E559 Vitamin D deficiency, unspecified: Secondary | ICD-10-CM | POA: Diagnosis not present

## 2021-10-29 DIAGNOSIS — I4719 Other supraventricular tachycardia: Secondary | ICD-10-CM

## 2021-10-29 DIAGNOSIS — I2510 Atherosclerotic heart disease of native coronary artery without angina pectoris: Secondary | ICD-10-CM | POA: Diagnosis not present

## 2021-10-29 HISTORY — DX: Vitamin D deficiency, unspecified: E55.9

## 2021-10-29 MED ORDER — SHINGRIX 50 MCG/0.5ML IM SUSR: 0.5000 mL | Freq: Once | INTRAMUSCULAR | 0 refills | Status: AC

## 2021-10-29 NOTE — Progress Notes (Signed)
Subjective:  Patient ID: Tina Perkins, female    DOB: 01-25-1941  Age: 81 y.o. MRN: [id number]  Chief Complaint  Patient presents with   Hyperlipidemia   Hypertension    HPI   Hyperlipidemia: Patient is taking pravastatin 40 mg daily. Patient presents with hyperlipidemia.  Compliance with treatment has been good; patient takes medicines as directed, maintains low cholesterol diet, follows up as directed, and maintains exercise regimen.  Patient is using pravastatin without problems.   Hypertension: Currently  amlodipine 2.5 mg  daily. And toprol XL 25mg  Patient presents for follow up of hypertension.  Patient tolerating amlodipine and toprol well with side effects.  Patient was diagnosed with hypertension 2010 so has been treated for hypertension for 12 years.Patient is working on maintaining diet and exercise regimen and follows up as directed. Complication include CAD.   Prediabetes: She is not taking any medication. She is on diet  PAT: She mentioned to have palpitations sometimes. She feels dizzy and headache. Patient said blurred vision today.  Patient has new onset of chest discomfort which he describes as a pressure where she gets very short of breath.  This started just a few months ago and does not radiate.  She has not taken any nitroglycerin since she does not like the effects of that.  She has to stop several times just walking in [place] this and normally she has very restricted activity due to what she calls shortness of breath although she has no respiratory symptoms and no history of COPD.  I will refer him back to cardiology because she had no symptoms for angina at that time and this seems to be a new problem.  Current Outpatient Medications on File Prior to Visit  Medication Sig Dispense Refill   acetaminophen (TYLENOL) 500 MG tablet Take 500 mg by mouth every 6 (six) hours as needed. Takes  as needed for pain     amLODipine (NORVASC) 2.5 MG tablet Take 1 tablet (2.5  mg total) by mouth daily. 90 tablet 2   aspirin 325 MG tablet Take 325 mg by mouth every 6 (six) hours as needed. Patient reports taking aspirin 2 tablets in the am, 2 tablets in the afternoon and 2 tablets at bedtime.     gabapentin (NEURONTIN) 100 MG capsule Take 1 capsule q.h.s. x2 weeks, then increase to 2 capsules q.h.s. thereafter.     meclizine (ANTIVERT) 12.5 MG tablet Take 1 tablet (12.5 mg total) by mouth 3 (three) times daily as needed for dizziness. 30 tablet 2   pravastatin (PRAVACHOL) 40 MG tablet Take 1 tablet (40 mg total) by mouth daily. 90 tablet 3   triamcinolone cream (KENALOG) 0.1 % Apply 1 application topically 2 (two) times daily. 30 g 0   nitroGLYCERIN (NITROSTAT) 0.4 MG SL tablet Place 1 tablet (0.4 mg total) under the tongue every 5 (five) minutes as needed for chest pain. 90 tablet 3   No current facility-administered medications on file prior to visit.   Past Medical History:  Diagnosis Date   Acute respiratory disease due to COVID-19 virus 08/12/2019   BMI 35.0-35.9,adult 03/31/2020   Chest pain, precordial 04/10/2020   Coronary artery disease involving native coronary artery of native heart without angina pectoris 05/10/2016   Apparently coronary intervention done in 1993 in [place] She was fine to have 75% narrowing of the mid LAD, nitroglycerin was given reduction to 30-40% it was also described as bridging in that area   Daytime somnolence 09/15/2020   Essential  hypertension 04/10/2020   History of hepatitis 12/14/2019   Mixed hyperlipidemia 12/14/2019   Obesity (BMI 30-39.9) 04/10/2020   Patent foramen ovale 12/14/2019   Pneumonia due to COVID-19 virus 08/12/2019   Pneumonia due to COVID-19 virus 08/12/2019   Prediabetes 12/28/2019   Pruritus 12/14/2019   Second degree AV block, Mobitz type I 09/15/2020   Sequelae of cerebral infarction 12/14/2019   Shortness of breath 04/10/2020   Snoring 09/15/2020   Spinal stenosis, lumbar region with neurogenic claudication 02/28/2019    Formatting of this note might be different from the original. Added automatically from request for surgery [id number] Formatting of this note might be different from the original. Added automatically from request for surgery [id number]   Past Surgical History:  Procedure Laterality Date   COLON SURGERY     TOTAL KNEE ARTHROPLASTY Bilateral    2017 and 2019    Family History  Problem Relation Age of Onset   Cancer Mother    Heart attack Mother    Heart attack Father    Cancer Father    Social History   Socioeconomic History   Marital status: Widowed    Spouse name: Not on file   Number of children: 11   Years of education: 9   Highest education level: 9th grade  Occupational History   Occupation: Retired  Tobacco Use   Smoking status: Never    Passive exposure: Never   Smokeless tobacco: Never  Vaping Use   Vaping Use: Never used  Substance and Sexual Activity   Alcohol use: Never   Drug use: Never   Sexual activity: Not Currently  Other Topics Concern   Not on file  Social History Narrative   Not on file   Social Determinants of Health   Financial Resource Strain: High Risk   Difficulty of Paying Living Expenses: Very hard  Food Insecurity: No Food Insecurity   Worried About Charity fundraiser in the Last Year: Never true   Ran Out of Food in the Last Year: Never true  Transportation Needs: Unmet Transportation Needs   Lack of Transportation (Medical): No   Lack of Transportation (Non-Medical): Yes  Physical Activity: Inactive   Days of Exercise per Week: 0 days   Minutes of Exercise per Session: 0 min  Stress: No Stress Concern Present   Feeling of Stress : Only a little  Social Connections: Socially Isolated   Frequency of Communication with Friends and Family: More than three times a week   Frequency of Social Gatherings with Friends and Family: More than three times a week   Attends Religious Services: Never   Marine scientist or Organizations: No    Attends Archivist Meetings: Never   Marital Status: Widowed    Review of Systems  Constitutional:  Negative for chills, fatigue and fever.  HENT:  Negative for congestion, ear pain and sore throat.   Eyes:  Positive for visual disturbance (blurred vision).  Respiratory:  Positive for shortness of breath (exercise.). Negative for cough and wheezing.   Cardiovascular:  Positive for chest pain and palpitations. Negative for leg swelling.  Gastrointestinal:  Negative for abdominal pain, constipation, diarrhea, nausea and vomiting.  Endocrine: Negative for polydipsia, polyphagia and polyuria.  Genitourinary:  Negative for difficulty urinating and dysuria.  Musculoskeletal:  Negative for arthralgias, back pain and myalgias.  Skin:  Negative for rash.  Neurological:  Positive for dizziness and headaches. Negative for weakness.  Psychiatric/Behavioral:  Negative for dysphoric mood.  The patient is not nervous/anxious.     Objective:  BP 110/80    Pulse (!) 58    Temp 98.2 F (36.8 C)    Resp 16    Ht 4\' 11"  (1.499 m)    Wt 181 lb (82.1 kg)    SpO2 97%    BMI 36.56 kg/m   BP/Weight 10/29/2021 09/17/2021 0000000  Systolic BP [id number] -  Diastolic BP 80 80 -  Wt. (Lbs) 181 - 182  BMI 36.56 - 36.76    Physical Exam Constitutional:      Appearance: Normal appearance. She is obese.  HENT:     Head: Normocephalic.     Right Ear: Tympanic membrane, ear canal and external ear normal.     Left Ear: Tympanic membrane, ear canal and external ear normal.     Nose: Nose normal.     Mouth/Throat:     Mouth: Mucous membranes are moist.     Pharynx: Oropharynx is clear. No posterior oropharyngeal erythema.  Eyes:     Extraocular Movements: Extraocular movements intact.     Conjunctiva/sclera: Conjunctivae normal.     Pupils: Pupils are equal, round, and reactive to light.  Neck:     Vascular: No carotid bruit.  Cardiovascular:     Rate and Rhythm: Normal rate and regular rhythm.      Pulses: Normal pulses.     Heart sounds: Normal heart sounds. No murmur heard. Pulmonary:     Effort: Pulmonary effort is normal.     Breath sounds: Normal breath sounds.  Abdominal:     General: Bowel sounds are normal.     Palpations: Abdomen is soft. There is no mass.  Musculoskeletal:        General: Normal range of motion.     Cervical back: Normal range of motion. No tenderness.     Right lower leg: No edema.     Left lower leg: No edema.  Neurological:     Gait: Gait normal.     Deep Tendon Reflexes: Reflexes normal.  Psychiatric:        Mood and Affect: Mood normal.        Behavior: Behavior normal.        Thought Content: Thought content normal.        Lab Results  Component Value Date   WBC 5.8 07/28/2021   HGB 14.6 07/28/2021   HCT 43.7 07/28/2021   PLT 234 07/28/2021   GLUCOSE 151 (H) 07/28/2021   CHOL 172 07/28/2021   TRIG 196 (H) 07/28/2021   HDL 41 07/28/2021   LDLCALC 97 07/28/2021   ALT 25 07/28/2021   AST 24 07/28/2021   NA 142 07/28/2021   K 4.8 07/28/2021   CL 106 07/28/2021   CREATININE 0.67 07/28/2021   BUN 17 07/28/2021   CO2 20 07/28/2021   TSH 0.372 (L) 07/31/2020   HGBA1C 6.6 (H) 07/28/2021   EKG: sinus bradycardia, no ST-T changes   Assessment & Plan:   Problem List Items Addressed This Visit       Cardiovascular and Mediastinum   Coronary artery disease involving native coronary artery of native heart without angina pectoris An individual plan was formulated based on patient history and exam, labs and evidence based data. Patient has had recent angina or nitroglycerin use. continue present treatment. We have set up appointment with Cardiology.    Essential hypertension   Relevant Orders   Comprehensive metabolic panel   CBC with Differential/Platelet  EKG 12-Lead An individual hypertension care plan was established and reinforced today.  The patient's status was assessed using clinical findings on exam and labs or  diagnostic tests. The patient's success at meeting treatment goals on disease specific evidence-based guidelines and found to be well controlled. SELF MANAGEMENT: The patient and I together assessed ways to personally work towards obtaining the recommended goals. RECOMMENDATIONS: avoid decongestants found in common cold remedies, decrease consumption of alcohol, perform routine monitoring of BP with home BP cuff, exercise, reduction of dietary salt, take medicines as prescribed, try not to miss doses and quit smoking.  Regular exercise and maintaining a healthy weight is needed.  Stress reduction may help. A CLINICAL SUMMARY including written plan identify barriers to care unique to individual due to social or financial issues.  We attempt to mutually creat solutions for individual and family understanding.     PAT (paroxysmal atrial tachycardia) (HCC)   Relevant Orders   EKG 12-Lead Patient has a diagnosis of paroxysmal atrial fibrillation.   Patient is on no anticoagulant and has controlled ventricular response.  Patient is CV stable.      Other   Mixed hyperlipidemia   Relevant Orders   Lipid panel   TSH AN INDIVIDUAL CARE PLAN for hyperlipidemia/ cholesterol was established and reinforced today.  The patient's status was assessed using clinical findings on exam, lab and other diagnostic tests. The patient's disease status was assessed based on evidence-based guidelines and found to be fair controlled. MEDICATIONS were reviewed. SELF MANAGEMENT GOALS have been discussed and patient's success at attaining the goal of low cholesterol was assessed. RECOMMENDATION given include regular exercise 3 days a week and low cholesterol/low fat diet. CLINICAL SUMMARY including written plan to identify barriers unique to the patient due to social or economic  reasons was discussed.     Sequelae of cerebral infarction Patient has headaches from stroke    Prediabetes - Primary   Relevant Orders    Hemoglobin A1c Patient is watching her diet   Hypovitaminosis D   Relevant Orders   VITAMIN D 25 Hydroxy (Vit-D Deficiency, Fractures) Check vitamin D level   Other Visit Diagnoses     Need for vaccination       Relevant Medications   Zoster Vaccine Adjuvanted [facility]) injection     .  Meds ordered this encounter  Medications   Zoster Vaccine Adjuvanted [facility]) injection    Sig: Inject 0.5 mLs into the muscle once for 1 dose.    Dispense:  0.5 mL    Refill:  0    Orders Placed This Encounter  Procedures   Comprehensive metabolic panel   Hemoglobin A1c   Lipid panel   TSH   CBC with Differential/Platelet   VITAMIN D 25 Hydroxy (Vit-D Deficiency, Fractures)   EKG 12-Lead   30 minute visit with review of old records I,Sung Parodi,acting as a scribe for Reinaldo Meeker, MD.,have documented all relevant documentation on the behalf of Reinaldo Meeker, MD,as directed by  Reinaldo Meeker, MD while in the presence of Reinaldo Meeker, MD.   Follow-up: Return in about 4 months (around 02/26/2022) for fasting.  An After Visit Summary was printed and given to the patient.  Reinaldo Meeker, MD [facility] [phone]

## 2021-10-30 LAB — COMPREHENSIVE METABOLIC PANEL
ALT: 26 IU/L (ref 0–32)
AST: 23 IU/L (ref 0–40)
Albumin/Globulin Ratio: 2 (ref 1.2–2.2)
Albumin: 4.3 g/dL (ref 3.7–4.7)
Alkaline Phosphatase: 92 IU/L (ref 44–121)
BUN/Creatinine Ratio: 18 (ref 12–28)
BUN: 14 mg/dL (ref 8–27)
Bilirubin Total: 0.4 mg/dL (ref 0.0–1.2)
CO2: 22 mmol/L (ref 20–29)
Calcium: 9.4 mg/dL (ref 8.7–10.3)
Chloride: 107 mmol/L — ABNORMAL HIGH (ref 96–106)
Creatinine, Ser: 0.77 mg/dL (ref 0.57–1.00)
Globulin, Total: 2.1 g/dL (ref 1.5–4.5)
Glucose: 141 mg/dL — ABNORMAL HIGH (ref 70–99)
Potassium: 4.4 mmol/L (ref 3.5–5.2)
Sodium: 142 mmol/L (ref 134–144)
Total Protein: 6.4 g/dL (ref 6.0–8.5)
eGFR: 78 mL/min/{1.73_m2} (ref >59)

## 2021-10-30 LAB — LIPID PANEL
Chol/HDL Ratio: 3.8 ratio (ref 0.0–4.4)
Cholesterol, Total: 156 mg/dL (ref 100–199)
HDL: 41 mg/dL (ref >39)
LDL Chol Calc (NIH): 86 mg/dL (ref 0–99)
Triglycerides: 171 mg/dL — ABNORMAL HIGH (ref 0–149)
VLDL Cholesterol Cal: 29 mg/dL (ref 5–40)

## 2021-10-30 LAB — CBC WITH DIFFERENTIAL/PLATELET
Basophils Absolute: 0.1 10*3/uL (ref 0.0–0.2)
Basos: 1 %
EOS (ABSOLUTE): 0.2 10*3/uL (ref 0.0–0.4)
Eos: 3 %
Hematocrit: 44.2 % (ref 34.0–46.6)
Hemoglobin: 14.7 g/dL (ref 11.1–15.9)
Immature Grans (Abs): 0 10*3/uL (ref 0.0–0.1)
Immature Granulocytes: 0 %
Lymphocytes Absolute: 1.9 10*3/uL (ref 0.7–3.1)
Lymphs: 36 %
MCH: 29.1 pg (ref 26.6–33.0)
MCHC: 33.3 g/dL (ref 31.5–35.7)
MCV: 88 fL (ref 79–97)
Monocytes Absolute: 0.4 10*3/uL (ref 0.1–0.9)
Monocytes: 8 %
Neutrophils Absolute: 2.7 10*3/uL (ref 1.4–7.0)
Neutrophils: 52 %
Platelets: 240 10*3/uL (ref 150–450)
RBC: 5.05 x10E6/uL (ref 3.77–5.28)
RDW: 11.9 % (ref 11.7–15.4)
WBC: 5.3 10*3/uL (ref 3.4–10.8)

## 2021-10-30 LAB — TSH: TSH: 0.892 u[IU]/mL (ref 0.450–4.500)

## 2021-10-30 LAB — CARDIOVASCULAR RISK ASSESSMENT

## 2021-10-30 LAB — HEMOGLOBIN A1C
Est. average glucose Bld gHb Est-mCnc: 148 mg/dL
Hgb A1c MFr Bld: 6.8 % — ABNORMAL HIGH (ref 4.8–5.6)

## 2021-10-30 LAB — VITAMIN D 25 HYDROXY (VIT D DEFICIENCY, FRACTURES): Vit D, 25-Hydroxy: 23.7 ng/mL — ABNORMAL LOW (ref 30.0–100.0)

## 2021-10-30 NOTE — Progress Notes (Signed)
Glucose 141, kidney tests normal, liver tests normal, A1c 6.8 good, triglycerides high, watch diet, vitamin D low, start vitamin D 4000 IU a day OTC, TSH 0.89 normal lp

## 2021-11-01 DIAGNOSIS — E785 Hyperlipidemia, unspecified: Secondary | ICD-10-CM | POA: Diagnosis not present

## 2021-11-01 DIAGNOSIS — M1990 Unspecified osteoarthritis, unspecified site: Secondary | ICD-10-CM | POA: Diagnosis not present

## 2021-11-01 DIAGNOSIS — I471 Supraventricular tachycardia: Secondary | ICD-10-CM | POA: Diagnosis not present

## 2021-11-01 DIAGNOSIS — G8929 Other chronic pain: Secondary | ICD-10-CM | POA: Diagnosis not present

## 2021-11-01 DIAGNOSIS — I10 Essential (primary) hypertension: Secondary | ICD-10-CM | POA: Diagnosis not present

## 2021-11-01 DIAGNOSIS — I739 Peripheral vascular disease, unspecified: Secondary | ICD-10-CM | POA: Diagnosis not present

## 2021-11-01 DIAGNOSIS — Z7982 Long term (current) use of aspirin: Secondary | ICD-10-CM | POA: Diagnosis not present

## 2021-11-01 DIAGNOSIS — M4800 Spinal stenosis, site unspecified: Secondary | ICD-10-CM | POA: Diagnosis not present

## 2021-11-01 DIAGNOSIS — I25119 Atherosclerotic heart disease of native coronary artery with unspecified angina pectoris: Secondary | ICD-10-CM | POA: Diagnosis not present

## 2021-11-01 DIAGNOSIS — Z6836 Body mass index (BMI) 36.0-36.9, adult: Secondary | ICD-10-CM | POA: Diagnosis not present

## 2021-11-01 DIAGNOSIS — Z008 Encounter for other general examination: Secondary | ICD-10-CM | POA: Diagnosis not present

## 2021-11-01 DIAGNOSIS — I252 Old myocardial infarction: Secondary | ICD-10-CM | POA: Diagnosis not present

## 2021-11-02 ENCOUNTER — Ambulatory Visit: Payer: Medicare HMO | Admitting: Cardiology

## 2021-11-02 ENCOUNTER — Other Ambulatory Visit: Payer: Self-pay

## 2021-11-02 ENCOUNTER — Encounter: Payer: Self-pay | Admitting: Cardiology

## 2021-11-02 VITALS — BP 136/80 | HR 95 | Ht 59.0 in | Wt 181.4 lb

## 2021-11-02 DIAGNOSIS — I2510 Atherosclerotic heart disease of native coronary artery without angina pectoris: Secondary | ICD-10-CM

## 2021-11-02 DIAGNOSIS — I10 Essential (primary) hypertension: Secondary | ICD-10-CM

## 2021-11-02 DIAGNOSIS — E782 Mixed hyperlipidemia: Secondary | ICD-10-CM

## 2021-11-02 DIAGNOSIS — Q2112 Patent foramen ovale: Secondary | ICD-10-CM | POA: Diagnosis not present

## 2021-11-02 NOTE — Patient Instructions (Signed)
Medication Instructions:  Your physician recommends that you continue on your current medications as directed. Please refer to the Current Medication list given to you today.  *If you need a refill on your cardiac medications before your next appointment, please call your pharmacy*   Lab Work: none If you have labs (blood work) drawn today and your tests are completely normal, you will receive your results only by: MyChart Message (if you have MyChart) OR A paper copy in the mail If you have any lab test that is abnormal or we need to change your treatment, we will call you to review the results.   Testing/Procedures: Your physician has requested that you have an echocardiogram. Echocardiography is a painless test that uses sound waves to create images of your heart. It provides your doctor with information about the size and shape of your heart and how well your hearts chambers and valves are working. This procedure takes approximately one hour. There are no restrictions for this procedure.    Follow-Up: At [facility], you and your health needs are our priority.  As part of our continuing mission to provide you with exceptional heart care, we have created designated Provider Care Teams.  These Care Teams include your primary Cardiologist (physician) and Advanced Practice Providers (APPs -  Physician Assistants and Nurse Practitioners) who all work together to provide you with the care you need, when you need it.  We recommend signing up for the patient portal called "MyChart".  Sign up information is provided on this After Visit Summary.  MyChart is used to connect with patients for Virtual Visits (Telemedicine).  Patients are able to view lab/test results, encounter notes, upcoming appointments, etc.  Non-urgent messages can be sent to your provider as well.   To learn more about what you can do with MyChart, go to [email or web address].    Your next appointment:   4 month(s)  The  format for your next appointment:   In Person  Provider:   Gypsy Balsam, MD    Other Instructions

## 2021-11-02 NOTE — Progress Notes (Signed)
Cardiology Office Note:    Date:  11/02/2021   ID:  Tina Perkins, DOB 1941/02/07, MRN [id number]  PCP:  Lillard Anes, MD  Cardiologist:  Jenne Campus, MD    Referring MD: Lillard Anes,*   Chief Complaint  Patient presents with   Chest Pain   Shortness of Breath    Ongoing for years    History of Present Illness:    Tina Perkins is a 81 y.o. female with past medical history significant for coronary artery disease she did have coronary CT angio which showed mild disease, paroxysmal atrial tachycardia.  However she does not want to take any medications for it.  She comes today to my office for follow-up and to be established as a patient.  She did see Dr. Harriet Masson before.  Overall she says she is doing fine but still complain of having some pains those pains are happening in the chest not related to exercise can happen when she sits when she sleeps eating does not make it worse.  She did have coronary CT angio done in September 2021 showing normal coronaries minimal coronary artery disease CAD RADS 1 calcium score was 12.  I told her she may be having GI issue and I advised her to start taking Maalox and Mylanta on as-needed basis.  She also complained of having some shortness of breath which is ongoing complaint for years but gradually getting worse.  Past Medical History:  Diagnosis Date   Acute respiratory disease due to COVID-19 virus 08/12/2019   BMI 35.0-35.9,adult 03/31/2020   Chest pain, precordial 04/10/2020   Chronic bilateral low back pain with bilateral sciatica 09/15/2021   Last Assessment & Plan:  Formatting of this note might be different from the original. 81 year old female seen today in initial consultation for her chronic back pain with bilateral lower extremity radiculopathy, left greater than right.    Will request previous lumbar spine MRI for review.  At this time, I will start patient on gabapentin 100 mg q.h.s. x2 weeks then increase her to 200 mg  q.h.s.    Coronary artery disease involving native coronary artery of native heart without angina pectoris 05/10/2016   Apparently coronary intervention done in 1993 in [place] She was fine to have 75% narrowing of the mid LAD, nitroglycerin was given reduction to 30-40% it was also described as bridging in that area   Daytime somnolence 09/15/2020   Elevated blood pressure 09/15/2021   Last Assessment & Plan:  Formatting of this note might be different from the original. Blood pressure elevated in clinic today.  Patient will continue to follow-up with his primary care provider for further management.   Essential hypertension 04/10/2020   History of hepatitis 12/14/2019   Hypovitaminosis D 10/29/2021   Mixed hyperlipidemia 12/14/2019   Neck pain 09/15/2021   Last Assessment & Plan:  Formatting of this note might be different from the original. Atraumatic, acute onset cervical spine pain and stiffness x 2-3 weeks which has been progressive without improvement.  No associated fevers or chills.  Associated headaches, left shoulder, and left upper extremity pain that radiates down into her hand and fingers. This pain is most likely radicular in nature.      Obesity (BMI 30-39.9) 04/10/2020   Pain medication agreement 09/15/2021   Last Assessment & Plan:  Formatting of this note might be different from the original. Agreement signed and placed in the patient's chart today..  UDS to be collected in compliance with clinic policies  and procedures.   Patient did not display any signs of abuse or diversion and has been checked on the [facility] Controlled Substance website.   Patent foramen ovale 12/14/2019   Pneumonia due to COVID-19 virus 08/12/2019   Pneumonia due to COVID-19 virus 08/12/2019   Prediabetes 12/28/2019   Pruritus 12/14/2019   Second degree AV block, Mobitz type I 09/15/2020   Sequelae of cerebral infarction 12/14/2019   Shortness of breath 04/10/2020   Snoring 09/15/2020   Spinal stenosis, lumbar  region with neurogenic claudication 02/28/2019   Formatting of this note might be different from the original. Added automatically from request for surgery [id number] Formatting of this note might be different from the original. Added automatically from request for surgery [id number]    Past Surgical History:  Procedure Laterality Date   COLON SURGERY     TOTAL KNEE ARTHROPLASTY Bilateral    2017 and 2019    Current Medications: Current Meds  Medication Sig   acetaminophen (TYLENOL) 500 MG tablet Take 500 mg by mouth every 6 (six) hours as needed for mild pain or moderate pain. Takes  as needed for pain   amLODipine (NORVASC) 2.5 MG tablet Take 1 tablet (2.5 mg total) by mouth daily.   aspirin 325 MG tablet Take 325 mg by mouth every 6 (six) hours as needed for mild pain or moderate pain. Patient reports taking aspirin 2 tablets in the am, 2 tablets in the afternoon and 2 tablets at bedtime.   gabapentin (NEURONTIN) 100 MG capsule Take 100 mg by mouth at bedtime.   meclizine (ANTIVERT) 12.5 MG tablet Take 1 tablet (12.5 mg total) by mouth 3 (three) times daily as needed for dizziness.   nitroGLYCERIN (NITROSTAT) 0.4 MG SL tablet Place 1 tablet (0.4 mg total) under the tongue every 5 (five) minutes as needed for chest pain.   pravastatin (PRAVACHOL) 40 MG tablet Take 1 tablet (40 mg total) by mouth daily.   triamcinolone cream (KENALOG) 0.1 % Apply 1 application topically 2 (two) times daily.     Allergies:   Sulfa antibiotics   Social History   Socioeconomic History   Marital status: Widowed    Spouse name: Not on file   Number of children: 11   Years of education: 9   Highest education level: 9th grade  Occupational History   Occupation: Retired  Tobacco Use   Smoking status: Never    Passive exposure: Never   Smokeless tobacco: Never  Vaping Use   Vaping Use: Never used  Substance and Sexual Activity   Alcohol use: Never   Drug use: Never   Sexual activity: Not Currently  Other  Topics Concern   Not on file  Social History Narrative   Not on file   Social Determinants of Health   Financial Resource Strain: High Risk   Difficulty of Paying Living Expenses: Very hard  Food Insecurity: No Food Insecurity   Worried About Charity fundraiser in the Last Year: Never true   Ran Out of Food in the Last Year: Never true  Transportation Needs: Unmet Transportation Needs   Lack of Transportation (Medical): No   Lack of Transportation (Non-Medical): Yes  Physical Activity: Inactive   Days of Exercise per Week: 0 days   Minutes of Exercise per Session: 0 min  Stress: No Stress Concern Present   Feeling of Stress : Only a little  Social Connections: Socially Isolated   Frequency of Communication with Friends and Family: More than  three times a week   Frequency of Social Gatherings with Friends and Family: More than three times a week   Attends Religious Services: Never   Marine scientist or Organizations: No   Attends Archivist Meetings: Never   Marital Status: Widowed     Family History: The patient's family history includes Cancer in her father and mother; Heart attack in her father and mother. ROS:   Please see the history of present illness.    All 14 point review of systems negative except as described per history of present illness  EKGs/Labs/Other Studies Reviewed:      Recent Labs: 10/29/2021: ALT 26; BUN 14; Creatinine, Ser 0.77; Hemoglobin 14.7; Platelets 240; Potassium 4.4; Sodium 142; TSH 0.892  Recent Lipid Panel    Component Value Date/Time   CHOL 156 10/29/2021 0841   TRIG 171 (H) 10/29/2021 0841   HDL 41 10/29/2021 0841   CHOLHDL 3.8 10/29/2021 0841   LDLCALC 86 10/29/2021 0841    Physical Exam:    VS:  BP 136/80 (BP Location: Right Arm, Patient Position: Sitting)    Pulse 95    Ht 4\' 11"  (1.499 m)    Wt 181 lb 6.4 oz (82.3 kg)    SpO2 95%    BMI 36.64 kg/m     Wt Readings from Last 3 Encounters:  11/02/21 181 lb 6.4  oz (82.3 kg)  10/29/21 181 lb (82.1 kg)  08/20/21 182 lb (82.6 kg)     GEN:  Well nourished, well developed in no acute distress HEENT: Normal NECK: No JVD; No carotid bruits LYMPHATICS: No lymphadenopathy CARDIAC: RRR, no murmurs, no rubs, no gallops RESPIRATORY:  Clear to auscultation without rales, wheezing or rhonchi  ABDOMEN: Soft, non-tender, non-distended MUSCULOSKELETAL:  No edema; No deformity  SKIN: Warm and dry LOWER EXTREMITIES: no swelling NEUROLOGIC:  Alert and oriented x 3 PSYCHIATRIC:  Normal affect   ASSESSMENT:    1. Coronary artery disease involving native coronary artery of native heart without angina pectoris   2. Essential hypertension   3. Patent foramen ovale   4. Mixed hyperlipidemia    PLAN:    In order of problems listed above:  Coronary artery disease only luminal disease based on coronary CT angio.  Risk factors modifications.  We will continue with aspirin however asked her to reduce the dose to only 81 mg daily, continue with statin and amlodipine. Chest pain which is atypical asked her to start taking Maalox and Mylanta on the regular basis. Shortness of breath.  I will ask him to have an echocardiogram done which we will do. Mixed dyslipidemia I did review K PN which show LDL of 97 HDL 41.  We will talk about potentially increasing dose of cholesterol-lowering medication.   Medication Adjustments/Labs and Tests Ordered: Current medicines are reviewed at length with the patient today.  Concerns regarding medicines are outlined above.  No orders of the defined types were placed in this encounter.  Medication changes: No orders of the defined types were placed in this encounter.   Signed, Park Liter, MD, [facility] 11/02/2021 10:56 AM    [place]

## 2021-11-03 DIAGNOSIS — I2510 Atherosclerotic heart disease of native coronary artery without angina pectoris: Secondary | ICD-10-CM

## 2021-11-03 DIAGNOSIS — E782 Mixed hyperlipidemia: Secondary | ICD-10-CM

## 2021-11-03 DIAGNOSIS — I10 Essential (primary) hypertension: Secondary | ICD-10-CM

## 2021-11-04 ENCOUNTER — Ambulatory Visit (INDEPENDENT_AMBULATORY_CARE_PROVIDER_SITE_OTHER): Payer: Medicare HMO

## 2021-11-04 ENCOUNTER — Other Ambulatory Visit: Payer: Self-pay

## 2021-11-04 DIAGNOSIS — E782 Mixed hyperlipidemia: Secondary | ICD-10-CM

## 2021-11-04 DIAGNOSIS — I10 Essential (primary) hypertension: Secondary | ICD-10-CM

## 2021-11-04 DIAGNOSIS — I2510 Atherosclerotic heart disease of native coronary artery without angina pectoris: Secondary | ICD-10-CM | POA: Diagnosis not present

## 2021-11-04 DIAGNOSIS — Q2112 Patent foramen ovale: Secondary | ICD-10-CM | POA: Diagnosis not present

## 2021-11-04 LAB — ECHOCARDIOGRAM COMPLETE: S' Lateral: 2.2 cm

## 2021-11-05 ENCOUNTER — Ambulatory Visit (INDEPENDENT_AMBULATORY_CARE_PROVIDER_SITE_OTHER): Payer: Medicare HMO | Admitting: *Deleted

## 2021-11-05 DIAGNOSIS — I2510 Atherosclerotic heart disease of native coronary artery without angina pectoris: Secondary | ICD-10-CM

## 2021-11-05 DIAGNOSIS — R7303 Prediabetes: Secondary | ICD-10-CM

## 2021-11-10 NOTE — Patient Instructions (Signed)
Visit Information  Thank you for taking time to visit with me today. Please don't hesitate to contact me if I can be of assistance to you before our next scheduled telephone appointment.     Our next appointment is by telephone on 12/03/21 at 2  Please call the care guide team at [phone] if you need to cancel or reschedule your appointment.   If you are experiencing a Mental Health or Behavioral Health Crisis or need someone to talk to, please call the [place] National Suicide Prevention Lifeline: 573-859-1300 or TTY: 575-307-9856 TTY 501-362-8614) to talk to a trained counselor call 1-800-273-TALK (toll free, 24 hour hotline) call 911   The patient verbalized understanding of instructions, educational materials, and care plan provided today and declined offer to receive copy of patient instructions, educational materials, and care plan.   Reece Levy MSW, LCSW Licensed Clinical Social Worker [facility]   [phone]

## 2021-11-10 NOTE — Chronic Care Management (AMB) (Signed)
Chronic Care Management    Clinical Social Work Note  11/10/2021 Name: Tina Perkins MRN: [id number] DOB: 09-Aug-1941  Tina Perkins is a 81 y.o. year old female who is a primary care patient of [name], MD. The CCM team was consulted to assist the patient with chronic disease management and/or care coordination needs related to: [employer] .   Engaged with patient by telephone for follow up visit in response to provider referral for social work chronic care management and care coordination services.   Consent to Services:  The patient was given information about Chronic Care Management services, agreed to services, and gave verbal consent prior to initiation of services.  Please see initial visit note for detailed documentation.   Patient agreed to services and consent obtained.   Assessment: Review of patient past medical history, allergies, medications, and health status, including review of relevant consultants reports was performed today as part of a comprehensive evaluation and provision of chronic care management and care coordination services.     SDOH (Social Determinants of Health) assessments and interventions performed:    Advanced Directives Status: Not addressed in this encounter.  CCM Care Plan  Allergies  Allergen Reactions   Sulfa Antibiotics Hives    Outpatient Encounter Medications as of 11/05/2021  Medication Sig   acetaminophen (TYLENOL) 500 MG tablet Take 500 mg by mouth every 6 (six) hours as needed for mild pain or moderate pain. Takes  as needed for pain   amLODipine (NORVASC) 2.5 MG tablet Take 1 tablet (2.5 mg total) by mouth daily.   aspirin 325 MG tablet Take 325 mg by mouth every 6 (six) hours as needed for mild pain or moderate pain. Patient reports taking aspirin 2 tablets in the am, 2 tablets in the afternoon and 2 tablets at bedtime.   gabapentin (NEURONTIN) 100 MG capsule Take 100 mg by mouth at bedtime.   meclizine (ANTIVERT)  12.5 MG tablet Take 1 tablet (12.5 mg total) by mouth 3 (three) times daily as needed for dizziness.   pravastatin (PRAVACHOL) 40 MG tablet Take 1 tablet (40 mg total) by mouth daily.   triamcinolone cream (KENALOG) 0.1 % Apply 1 application topically 2 (two) times daily.   No facility-administered encounter medications on file as of 11/05/2021.    Patient Active Problem List   Diagnosis Date Noted   Hypovitaminosis D 10/29/2021   Chronic bilateral low back pain with bilateral sciatica 09/15/2021   Elevated blood pressure 09/15/2021   Neck pain 09/15/2021   Pain medication agreement 09/15/2021   PAT (paroxysmal atrial tachycardia) (HCC) 09/15/2020   Second degree AV block, Mobitz type I 09/15/2020   Essential hypertension 04/10/2020   Prediabetes 12/28/2019   Mixed hyperlipidemia 12/14/2019   Patent foramen ovale 12/14/2019   Sequelae of cerebral infarction 12/14/2019   History of hepatitis 12/14/2019   Spinal stenosis, lumbar region with neurogenic claudication 02/28/2019   Coronary artery disease involving native coronary artery of native heart without angina pectoris 05/10/2016    Conditions to be addressed/monitored: CAD; Lacks knowledge of community resource:    Care Plan : LCSW Plan of Care  Updates made by Buck Mam, LCSW since 11/10/2021 12:00 AM     Problem: Find Help in My Community.   Priority: High     Goal: Find Help in My Community.   Start Date: 07/30/2021  Expected End Date: 12/28/2021  Recent Progress: On track  Priority: High  Note:   Current Barriers:   Financial constraints  related to low - Social Security Income, and only source of income.   Limited - social, family and caregiver support, and social isolation.   Transportation limited - inability to afford to replace transmission in vehicle, and inability to afford fees associated with public transportation, such as [employer] Occupational hygienist). Housing constraints -  1988 3-bedroom mobile home has holes in the floors in every room, except 2 (living room and half bathroom), mold and mildew throughout. Clinical Goals:  Patient will work with LCSW and Community Care Guides to address needs related to housing, financial and transportation insecurities.   Clinical Interventions:  Collaboration with Primary Care Physician, Dr. Brent Bulla regarding development and update of comprehensive plan of care as evidenced by provider attestation and co-signature. Inter-disciplinary care team collaboration (see longitudinal plan of care). Other Clinical Interventions: CSW spoke with pt on 11/05/21 who reports still seeking housing options- she has not applied for Section 8 yet- stressed the importance of following through asap. Pt and family are uncertain of her Medicaid status- type of Medicaid she may have. CSW will attempt to inquire with DSS for clarification. Solution-Focused Strategies implemented, Problem Solving/Task-Centered Skills utilized, Motivational Interviewing performed. Mailed the following list of housing resources to patient's home on 10/12/2021:  Permanent Subsidized Housing and Private Landlords Who Accept Section 8 Vouchers. Patient Goals/Self-Care Activities:  Work with LCSW on a monthly basis, in an effort to obtain permanent, safe, and affordable housing. Remain on waiting list for Affordable Housing and [employer], through the [employer] [phone]).   ~ Periodically call to check the status of your name on the waiting list.    Remain on waiting list for Section 8 Housing and [employer], through the [employer] [phone]).  ~ Periodically call to check the status of your name on the waiting list.  Begin contacting Permanent Subsidized Housing Resources [phone]), to inquire about availability. Begin contacting Private Landlords Who Accept Section 8 Vouchers [phone]), to inquire  about availability. Continue to utilize [employer] [phone]), for transportation assistance to and from all [facility] affiliated facilities and/or provider practices. Contact LCSW directly if you have questions, need assistance, or if additional social work needs are identified. Follow-Up Date:  Follow-up telephone outreach call scheduled for 12/03/21      Follow Up Plan: Appointment scheduled for SW follow up with client by phone on: 12/03/21   Reece Levy MSW, LCSW Licensed Clinical Social Worker [facility]   [phone]

## 2021-11-12 ENCOUNTER — Telehealth: Payer: Self-pay

## 2021-11-12 NOTE — Chronic Care Management (AMB) (Signed)
° ° °  Chronic Care Management Pharmacy Assistant   Name: Tina Perkins  MRN: [id number] DOB: 10/02/1941   Reason for Encounter: Medication Coordination for Upstream    Recent office visits:  10/29/21 Brent Bulla MD. Seen for HLD and HTN. No med changes.  Recent consult visits:  11/02/21 (Cardiology) Gypsy Balsam MD. Seen for CAD. No med changes.   Hospital visits:  None  Medications: Outpatient Encounter Medications as of 11/12/2021  Medication Sig   acetaminophen (TYLENOL) 500 MG tablet Take 500 mg by mouth every 6 (six) hours as needed for mild pain or moderate pain. Takes  as needed for pain   amLODipine (NORVASC) 2.5 MG tablet Take 1 tablet (2.5 mg total) by mouth daily.   aspirin 325 MG tablet Take 325 mg by mouth every 6 (six) hours as needed for mild pain or moderate pain. Patient reports taking aspirin 2 tablets in the am, 2 tablets in the afternoon and 2 tablets at bedtime.   gabapentin (NEURONTIN) 100 MG capsule Take 100 mg by mouth at bedtime.   meclizine (ANTIVERT) 12.5 MG tablet Take 1 tablet (12.5 mg total) by mouth 3 (three) times daily as needed for dizziness.   nitroGLYCERIN (NITROSTAT) 0.4 MG SL tablet Place 1 tablet (0.4 mg total) under the tongue every 5 (five) minutes as needed for chest pain.   pravastatin (PRAVACHOL) 40 MG tablet Take 1 tablet (40 mg total) by mouth daily.   triamcinolone cream (KENALOG) 0.1 % Apply 1 application topically 2 (two) times daily.   No facility-administered encounter medications on file as of 11/12/2021.    Reviewed chart for medication changes ahead of medication coordination call.  No hospital visits since last care coordination call/Pharmacist visit.   No medication changes indicated OR if recent visit, treatment plan here.  BP Readings from Last 3 Encounters:  11/02/21 136/80  10/29/21 110/80  09/17/21 136/80    Lab Results  Component Value Date   HGBA1C 6.8 (H) 10/29/2021     Patient obtains medications through  Vials  30 Days   Last adherence delivery included:   Patient is due for her first adherence delivery on: 11/24/21. Called patient and reviewed medications and coordinated delivery.  This delivery to include: Pravastatin 40 mg  daily  Amlodipine 2.5 mg daily   Patient declined the following medications  None  Patient needs refills  None  Confirmed delivery date of 11/24/21, advised patient that pharmacy will contact them the morning of delivery.  Roxana Hires, CMA Clinical Pharmacist Assistant  [phone]

## 2021-11-12 NOTE — Telephone Encounter (Signed)
Compliant on meds 

## 2021-11-18 ENCOUNTER — Other Ambulatory Visit: Payer: Self-pay

## 2021-11-18 ENCOUNTER — Ambulatory Visit: Payer: Medicare HMO

## 2021-11-18 DIAGNOSIS — E782 Mixed hyperlipidemia: Secondary | ICD-10-CM

## 2021-11-18 DIAGNOSIS — R7303 Prediabetes: Secondary | ICD-10-CM

## 2021-11-18 DIAGNOSIS — I2510 Atherosclerotic heart disease of native coronary artery without angina pectoris: Secondary | ICD-10-CM

## 2021-11-18 NOTE — Patient Instructions (Signed)
Visit Information   Goals Addressed   None    Patient Care Plan: LCSW Plan of Care     Problem Identified: Find Help in My Community.   Priority: High     Goal: Find Help in My Community.   Start Date: 07/30/2021  Expected End Date: 12/28/2021  Recent Progress: On track  Priority: High  Note:   Current Barriers:   Financial constraints related to low - Social Security Income, and only source of income.   Limited - social, family and caregiver support, and social isolation.   Transportation limited - inability to afford to replace transmission in vehicle, and inability to afford fees associated with public transportation, such as [employer]). Housing constraints - 1988 3-bedroom mobile home has holes in the floors in every room, except 2 (living room and half bathroom), mold and mildew throughout. Clinical Goals:  Patient will work with LCSW and [place] [employer] to address needs related to housing, financial and transportation insecurities.   Clinical Interventions:  Collaboration with Primary Care Physician, Dr. Reinaldo Meeker regarding development and update of comprehensive plan of care as evidenced by provider attestation and co-signature. Inter-disciplinary care team collaboration (see longitudinal plan of care). Other Clinical Interventions: CSW spoke with pt on 11/05/21 who reports still seeking housing options- she has not applied for Section 8 yet- stressed the importance of following through asap. Pt and family are uncertain of her Medicaid status- type of Medicaid she may have. CSW will attempt to inquire with DSS for clarification. Solution-Focused Strategies implemented, Problem Solving/Task-Centered Skills utilized, Motivational Interviewing performed. Mailed the following list of housing resources to patient's home on 10/12/2021:  [place] Who Accept Section 8 Vouchers. Patient  Goals/Self-Care Activities:  Work with LCSW on a monthly basis, in an effort to obtain permanent, safe, and affordable housing. Remain on waiting list for Affordable Housing and [employer], through the [employer] [phone]).   ~ Periodically call to check the status of your name on the waiting list.    Remain on waiting list for Section 8 Housing and [employer], through the [employer] [phone]).  ~ Periodically call to check the status of your name on the waiting list.  Begin contacting [place] [phone]), to inquire about availability. Begin contacting Private Landlords Who Accept Section 8 Vouchers [phone]), to inquire about availability. Continue to utilize [employer] [phone]), for transportation assistance to and from all [facility] affiliated facilities and/or provider practices. Contact LCSW directly if you have questions, need assistance, or if additional social work needs are identified. Follow-Up Date:  Follow-up telephone outreach call scheduled for 12/03/21    Patient Care Plan: CCM Pharmacy Care Plan     Problem Identified: Hypertension, Lipids   Priority: High  Onset Date: 08/13/2021     Goal: Disease State Management   Start Date: 08/13/2021  Expected End Date: 08/13/2022  Recent Progress: On track  Priority: High  Note:   Current Barriers:  Does not contact provider office for questions/concerns  Pharmacist Clinical Goal(s):  Patient will contact provider office for questions/concerns as evidenced notation of same in electronic health record through collaboration with PharmD and provider.   Interventions: 1:1 collaboration with Lillard Anes, MD regarding development and update of comprehensive plan of care as evidenced by provider attestation and co-signature Inter-disciplinary care team collaboration (see longitudinal plan  of care) Comprehensive medication  review performed; medication list updated in electronic medical record  Hypertension (BP goal <140/90) BP Readings from Last 3 Encounters:  08/11/21 (!) 142/82  07/28/21 (!) 146/80  02/12/21 124/80  -Uncontrolled -Current treatment: Amlodpine 2.65m Appropriate, Effective, Safe, Accessible -Medications previously tried: Metoprolol 288m(take 1/2)  -Current home readings: Doesn't test -Current dietary habits: "Tries to eat healthy" -Current exercise habits: None, tired just standing -Denies hypotensive/hypertensive symptoms -Educated on Symptoms of hypotension and importance of maintaining adequate hydration; -Counseled to monitor BP at home weekly, document, and provide log at future appointments November 2022: Patient does not like taking medicine. She was told, "Take this BP med and it'll slow your heart down." She got scared because she thought, "How does it know to stop, what if it slows down too much" and she never took it. Spent majority of visit talking about hypotension and S/S  CAD: (LDL goal < 100) The ASCVD Risk score (Arnett DK, et al., 2019) failed to calculate for the following reasons:   The 2019 ASCVD risk score is only valid for ages 4073o 7925 The patient has a prior MI or stroke diagnosis Lab Results  Component Value Date   CHOL 172 07/28/2021   CHOL 173 07/31/2020   CHOL 186 03/31/2020   Lab Results  Component Value Date   HDL 41 07/28/2021   HDL 46 07/31/2020   HDL 46 03/31/2020   Lab Results  Component Value Date   LDLCALC 97 07/28/2021   LDLCALC 100 (H) 07/31/2020   LDLCALC 113 (H) 03/31/2020   Lab Results  Component Value Date   TRIG 196 (H) 07/28/2021   TRIG 154 (H) 07/31/2020   TRIG 150 (H) 03/31/2020   Lab Results  Component Value Date   CHOLHDL 4.2 07/28/2021   CHOLHDL 3.8 07/31/2020   CHOLHDL 4.0 03/31/2020  No results found for: LDLDIRECT -Controlled -Current treatment: Pravastatin 4027mppropriate,  Effective, Safe, Accessible ASA 52m45mpropriate, Effective, Safe, Accessible -Medications previously tried: N/A  -Current dietary habits: "Tries to eat healthy" -Current exercise habits: None, tired just standing -Educated on Cholesterol goals;  Feb 2023: Convinced patient to take ASA 52mg67m325mg 71mCardio note from Jan 2023  Misc: -Patient states she's tired all the time and has gained 50 pounds and doesn't know why. [place] told her that this could be a side effect of the procedure and that she'd need imaging if it happens. I asked her if she told Dr. Perry Henrene Pastorhe said no. Plan: Patient needs an AWV, will try to get that and a f/u with Dr. Perry Henrene Pastore can be referred to imaging/specialist if needed  Patient Goals/Self-Care Activities Patient will:  - take medications as prescribed as evidenced by patient report and record review  Follow Up Plan: The patient has been provided with contact information for the care management team and has been advised to call with any health related questions or concerns.   CPP F/U: Monthly for refills  NathanArizona Constablem.D. - [phone] Care Plan: Hypertension (Adult)  Completed 09/17/2021   Problem Identified: Hypertension (Hypertension) Resolved 09/17/2021  Priority: Medium  Onset Date: 08/20/2021  Note:   Resolving due to duplicate goal  Objective:  Last practice recorded BP readings:  BP Readings from Last 3 Encounters:  08/11/21 (!) 142/82  07/28/21 (!) 146/80  02/12/21 124/80   Most recent eGFR/CrCl:  Lab Results  Component Value Date   EGFR 88 07/28/2021    No components found for: CRCL Current Barriers:  Patient with a history of hypertension with elevated readings for the last 2 office visits.  Patient reports that she does not self monitor. Has 3 BP cuffs at home. Agrees to self monitor twice a week No advanced directives. Interested in advance directives.  Case  Manager Clinical Goal(s):  patient will verbalize understanding of plan for hypertension management patient will attend all scheduled medical appointments: PCP and call ortho for an appointment patient will demonstrate improved health management independence as evidenced by checking blood pressure as directed and notifying PCP if SBP>140 or DBP > 90, taking all medications as prescribe, and adhering to a low sodium diet as discussed. Interventions:  Collaboration with Lillard Anes, MD regarding development and update of comprehensive plan of care as evidenced by provider attestation and co-signature Inter-disciplinary care team collaboration (see longitudinal plan of care) Provided education to patient re: stroke prevention, s/s of heart attack and stroke, DASH diet, complications of uncontrolled blood pressure Reviewed medications with patient and discussed importance of compliance Discussed plans with patient for ongoing care management follow up and provided patient with direct contact information for care management team Advised patient, providing education and rationale, to monitor blood pressure daily and record, calling PCP for findings outside established parameters.  Reviewed scheduled/upcoming provider appointments including: PCP Mailed advanced directives, reviewed importance of completion Reviewed importance of self monitoring. Mailed [facility] calendar for recording of BP Self-Care Activities/Patient goals: - Self administers medications as prescribed -Attends all scheduled provider appointments -Calls provider office for new concerns, questions, or BP outside discussed parameters -Checks BP and records as discussed -Follows a low sodium diet/DASH diet - check blood pressure 2 times per week and record.  Follow Up Plan: Telephone follow up appointment with care management team member scheduled for:   09/17/2021    Patient Care Plan: Chronic Pain (Adult)  Completed  09/17/2021   Problem Identified: Chronic Pain Management (Chronic Pain) Resolved 09/17/2021  Priority: High  Onset Date: 08/20/2021  Note:    Resolving due to duplicate goal  Current Barriers:  Patient with chronic pain. Pain level of 9/10 Taking atleast 6 aspirins per day difficulty in performing IADLs independently-Patient reports she is not able to do much. Has to sit down to vacuum. Has to sit down to try to cook.  Is currently using her daughters scooter to go to the store because she can not walk in the store.  Reports she is miserable in pain. Poor quality of life due to pain. Reports gaining 50 pounds in the last 5 years due to back pain and being unable to move around much 1 fall 3 months ago Clinical Goal(s):  patient will verbalize understanding of plan for pain management. , patient will attend all scheduled medical appointments: PCP, and patient will use pharmacological and nonpharmacological pain relief strategies as prescribed.  Interventions:  Collaboration with Lillard Anes, MD regarding development and update of comprehensive plan of care as evidenced by provider attestation and co-signature Pain assessment performed Medications reviewed Discussed plans with patient for ongoing care management follow up and provided patient with direct contact information for care management team Provided education to patient and/or caregiver about advanced directives Reviewed medications with patient and discussed importance of taking all medications as prescribed. Reviewed my concern for patient taking so much aspirin. Reviewed scheduled/upcoming provider appointments including:  Discussed plans with patient for ongoing care management  follow up and provided patient with direct contact information for care management team Encouraged patient to call ortho and make an appointment asap. Reviewed OTC options like muscle rubs.  Patient voices that she has tried everything  including: creams, lotions, patches, back brace Reviewed safe use of heating pad.  Mailed education about chronic pain. Reviewed fall prevention.  Patient Goals/Self Care Activities:  Will self-administer medications as prescribed Will attend all scheduled provider appointments Will call pharmacy for medication refills 7 days prior to needed refill date Patient will calls provider office for new concerns or questions Follow Up Plan: Telephone follow up appointment with care management team member scheduled for:  09/17/2021      Patient Care Plan: RN Care manager plan of care     Problem Identified: No plan of care established for chronic disease states (Hypertension, chronic pain)   Priority: High  Onset Date: 09/17/2021     Long-Range Goal: Development of plan of care for chonic disease management ( hypertension and chronic pain)   Start Date: 09/17/2021  Expected End Date: 09/17/2022  Priority: High  Note:   Current Barriers:  Chronic Disease Management support and education needs related to HTN and no advanced directives, chronic pain   09/17/2021  Patient reports that she is self monitoring her blood pressure. Reports range of 136-140/80.  Reports she is taking her medications as prescribed. Reports she saw her pain doctor yesterday and is have x rays. Has follow up planned for 1 month. Reports she received her advanced directive packet in the mail but has not completed it.   RNCM Clinical Goal(s):  Patient will take all medications exactly as prescribed and will call provider for medication related questions as evidenced by patient report of taking his medications correctly attend all scheduled medical appointments: PCP as evidenced by review of office notes continue to work with RN Care Manager to address care management and care coordination needs related to  HTN and chronic pain as evidenced by adherence to CM Team Scheduled appointments through collaboration with RN Care  manager, provider, and care team.   Interventions: 1:1 collaboration with primary care provider regarding development and update of comprehensive plan of care as evidenced by provider attestation and co-signature Inter-disciplinary care team collaboration (see longitudinal plan of care) Evaluation of current treatment plan related to  self management and patient's adherence to plan as established by provider   Hypertension Interventions:  (Status:  New goal.) Long Term Goal Last practice recorded BP readings:  BP Readings from Last 3 Encounters:  09/17/21 136/80  08/11/21 (!) 142/82  07/28/21 (!) 146/80  Most recent eGFR/CrCl:  Lab Results  Component Value Date   EGFR 88 07/28/2021    No components found for: CRCL  Evaluation of current treatment plan related to hypertension self management and patient's adherence to plan as established by provider Reviewed medications with patient and discussed importance of compliance Discussed plans with patient for ongoing care management follow up and provided patient with direct contact information for care management team Advised patient, providing education and rationale, to monitor blood pressure daily and record, calling PCP for findings outside established parameters Reviewed scheduled/upcoming provider appointments including:  Discussed complications of poorly controlled blood pressure such as heart disease, stroke, circulatory complications, vision complications, kidney impairment, sexual dysfunction  Pain Interventions:  (Status:  New goal.) Long Term Goal Pain assessment performed Medications reviewed Reviewed provider established plan for pain management Discussed importance of adherence to all scheduled medical appointments Counseled  on the importance of reporting any/all new or changed pain symptoms or management strategies to pain management provider  Patient Goals/Self-Care Activities: Take all medications as prescribed Attend  all scheduled provider appointments Call pharmacy for medication refills 3-7 days in advance of running out of medications Call provider office for new concerns or questions  Work with the social worker to address care coordination needs and will continue to work with the clinical team to address health care and disease management related needs call the Suicide and Crisis Lifeline: 988 call the [place] National Suicide Prevention Lifeline: 7370323293 or TTY: 212 703 2148 TTY [phone]) to talk to a trained counselor call 1-800-273-TALK (toll free, 24 hour hotline) call 911 if experiencing a Mental Health or [place]  check blood pressure daily choose a place to take my blood pressure (home, clinic or office, retail store) write blood pressure results in a log or diary keep a blood pressure log take blood pressure log to all doctor appointments call doctor for signs and symptoms of high blood pressure keep all doctor appointments take medications for blood pressure exactly as prescribed report new symptoms to your doctor  Follow Up Plan:  Telephone follow up appointment with care management team member scheduled for:  11/19/2021  at 1:30pm       Ms. Mellott was given information about Chronic Care Management services today including:  CCM service includes personalized support from designated clinical staff supervised by her physician, including individualized plan of care and coordination with other care providers 24/7 contact phone numbers for assistance for urgent and routine care needs. Standard insurance, coinsurance, copays and deductibles apply for chronic care management only during months in which we provide at least 20 minutes of these services. Most insurances cover these services at 100%, however patients may be responsible for any copay, coinsurance and/or deductible if applicable. This service may help you avoid the need for more expensive face-to-face  services. Only one practitioner may furnish and bill the service in a calendar month. The patient may stop CCM services at any time (effective at the end of the month) by phone call to the office staff.  Patient agreed to services and verbal consent obtained.   The patient verbalized understanding of instructions, educational materials, and care plan provided today and declined offer to receive copy of patient instructions, educational materials, and care plan.  The pharmacy team will reach out to the patient again over the next 60 days.   Lane Hacker, [place]

## 2021-11-18 NOTE — Progress Notes (Signed)
Chronic Care Management Pharmacy Note  11/18/2021 Name:  Tina Perkins MRN:  [id number] DOB:  [birth year]  Summary: -Pleasant 81 year old woman presents for initial CCM visit. She is lacking transportation and is very frustrated with her current state of health. She's tired all the time and has gained a lot of weight recently  Recommendations/Changes made from today's visit: -Patient will start ASA 41m per Cardio  Subjective: Tina Nesbyis an 81y.o. year old female who is a primary patient of PHenrene Pastor LZeb Comfort MD.  The CCM team was consulted for assistance with disease management and care coordination needs.    Engaged with patient by telephone for follow up visit in response to provider referral for pharmacy case management and/or care coordination services.   Consent to Services:  The patient was given the following information about Chronic Care Management services today, agreed to services, and gave verbal consent: 1. CCM service includes personalized support from designated clinical staff supervised by the primary care provider, including individualized plan of care and coordination with other care providers 2. 24/7 contact phone numbers for assistance for urgent and routine care needs. 3. Service will only be billed when office clinical staff spend 20 minutes or more in a month to coordinate care. 4. Only one practitioner may furnish and bill the service in a calendar month. 5.The patient may stop CCM services at any time (effective at the end of the month) by phone call to the office staff. 6. The patient will be responsible for cost sharing (co-pay) of up to 20% of the service fee (after annual deductible is met). Patient agreed to services and consent obtained.  Patient Care Team: PLillard Anes MD as PCP - General (Family Medicine) TBerniece Salines DO as PCP - Cardiology (Cardiology) KLane Perkins RVa [facility] Pharmacist (Pharmacist) CThana Ates RN as Case  Manager CDeirdre Peer LCSW as Social Worker  Recent office visits:  07-30-2021 [name](CCM)   07-28-2021 PLillard Anes MD. STOP lotrisone cream. START Pravastatin 40 mg daily and Kenelog cream twice daily. Trig= 196. A1C= 6.6. Glucose= 151.   Recent consult visits:  03-27-2021 [facility].   02-12-2021 TBerniece Salines DO (Cardiology). Completed course of vitamin D.   Hospital visits:  None in previous 6 months   Objective:  Lab Results  Component Value Date   CREATININE 0.77 10/29/2021   BUN 14 10/29/2021   GFRNONAA 84 07/31/2020   GFRAA 97 07/31/2020   NA 142 10/29/2021   K 4.4 10/29/2021   CALCIUM 9.4 10/29/2021   CO2 22 10/29/2021   GLUCOSE 141 (H) 10/29/2021    Lab Results  Component Value Date/Time   HGBA1C 6.8 (H) 10/29/2021 08:41 AM   HGBA1C 6.6 (H) 07/28/2021 09:06 AM    Last diabetic Eye exam: No results found for: HMDIABEYEEXA  Last diabetic Foot exam: No results found for: HMDIABFOOTEX   Lab Results  Component Value Date   CHOL 156 10/29/2021   HDL 41 10/29/2021   LDLCALC 86 10/29/2021   TRIG 171 (H) 10/29/2021   CHOLHDL 3.8 10/29/2021    Hepatic Function Latest Ref Rng & Units 10/29/2021 07/28/2021 07/31/2020  Total Protein 6.0 - 8.5 g/dL 6.4 6.9 7.0  Albumin 3.7 - 4.7 g/dL 4.3 4.5 4.4  AST 0 - 40 IU/L 23 24 20   ALT 0 - 32 IU/L 26 25 23   Alk Phosphatase 44 - 121 IU/L 92 87  90  Total Bilirubin 0.0 - 1.2 mg/dL 0.4 0.5 0.5    Lab Results  Component Value Date/Time   TSH 0.892 10/29/2021 08:41 AM   TSH 0.372 (L) 07/31/2020 10:20 AM   FREET4 1.03 08/11/2020 01:46 PM   FREET4 0.96 12/28/2019 10:12 AM    CBC Latest Ref Rng & Units 10/29/2021 07/28/2021 07/31/2020  WBC 3.4 - 10.8 x10E3/uL 5.3 5.8 8.8  Hemoglobin 11.1 - 15.9 g/dL 14.7 14.6 14.5  Hematocrit 34.0 - 46.6 % 44.2 43.7 43.7  Platelets 150 - 450 x10E3/uL 240 234 252    Lab Results  Component Value  Date/Time   VD25OH 23.7 (L) 10/29/2021 08:41 AM   VD25OH 19.9 (L) 09/15/2020 11:28 AM    Clinical ASCVD: No  The ASCVD Risk score (Arnett DK, et al., 2019) failed to calculate for the following reasons:   The 2019 ASCVD risk score is only valid for ages 55 to 36   The patient has a prior MI or stroke diagnosis    Depression screen [facility] 2/9 08/20/2021 07/30/2021 07/28/2021  Decreased Interest 0 0 0  Down, Depressed, Hopeless 0 0 0  PHQ - 2 Score 0 0 0  Altered sleeping - - -  Tired, decreased energy - - -  Change in appetite - - -  Feeling bad or failure about yourself  - - -  Trouble concentrating - - -  Moving slowly or fidgety/restless - - -  Suicidal thoughts - - -  PHQ-9 Score - - -  Difficult doing work/chores - - -     Other: (CHADS2VASc if Afib, MMRC or CAT for COPD, ACT, DEXA)  Social History   Tobacco Use  Smoking Status Never   Passive exposure: Never  Smokeless Tobacco Never   BP Readings from Last 3 Encounters:  11/02/21 136/80  10/29/21 110/80  09/17/21 136/80   Pulse Readings from Last 3 Encounters:  11/02/21 95  10/29/21 (!) 58  07/28/21 70   Wt Readings from Last 3 Encounters:  11/02/21 181 lb 6.4 oz (82.3 kg)  10/29/21 181 lb (82.1 kg)  08/20/21 182 lb (82.6 kg)   BMI Readings from Last 3 Encounters:  11/02/21 36.64 kg/m  10/29/21 36.56 kg/m  08/20/21 36.76 kg/m    Assessment/Interventions: Review of patient past medical history, allergies, medications, health status, including review of consultants reports, laboratory and other test data, was performed as part of comprehensive evaluation and provision of chronic care management services.   SDOH:  (Social Determinants of Health) assessments and interventions performed: Yes  SDOH Screenings   Alcohol Screen: Low Risk    Last Alcohol Screening Score (AUDIT): 0  Depression (PHQ2-9): Low Risk    PHQ-2 Score: 0  Financial Resource Strain: High Risk   Difficulty of Paying Living Expenses:  Very hard  Food Insecurity: No Food Insecurity   Worried About Charity fundraiser in the Last Year: Never true   Ran Out of Food in the Last Year: Never true  Housing: High Risk   Last Housing Risk Score: 2  Physical Activity: Inactive   Days of Exercise per Week: 0 days   Minutes of Exercise per Session: 0 min  Social Connections: Socially Isolated   Frequency of Communication with Friends and Family: More than three times a week   Frequency of Social Gatherings with Friends and Family: More than three times a week   Attends Religious Services: Never   Marine scientist or Organizations: No   Attends  Club or Organization Meetings: Never   Marital Status: Widowed  Stress: No Stress Concern Present   Feeling of Stress : Only a little  Tobacco Use: Low Risk    Smoking Tobacco Use: Never   Smokeless Tobacco Use: Never   Passive Exposure: Never  Transportation Needs: Unmet Transportation Needs   Lack of Transportation (Medical): No   Lack of Transportation (Non-Medical): Yes    CCM Care Plan  Allergies  Allergen Reactions   Sulfa Antibiotics Hives    Medications Reviewed Today     Reviewed by Tina Perkins, [facility] (Pharmacist) on 11/18/21 at 1406  Med List Status: <None>   Medication Order Taking? Sig Documenting Provider Last Dose Status Informant  acetaminophen (TYLENOL) 500 MG tablet [id number]  Take 500 mg by mouth every 6 (six) hours as needed for mild pain or moderate pain. Takes  as needed for pain [provider]  Active   amLODipine (NORVASC) 2.5 MG tablet [id number] Yes Take 1 tablet (2.5 mg total) by mouth daily. Tina Anes, MD Taking Active   aspirin 325 MG tablet [id number] No Take 325 mg by mouth every 6 (six) hours as needed for mild pain or moderate pain. Patient reports taking aspirin 2 tablets in the am, 2 tablets in the afternoon and 2 tablets at bedtime.  Patient not taking: Reported on 11/18/2021   [provider] Not Taking  Consider Medication Status and Discontinue   aspirin EC 81 MG tablet [id number] Yes Take 81 mg by mouth daily. Swallow whole. [provider] Taking Active   gabapentin (NEURONTIN) 100 MG capsule [id number] Yes Take 100 mg by mouth at bedtime. [provider] Taking Active   meclizine (ANTIVERT) 12.5 MG tablet [id number]  Take 1 tablet (12.5 mg total) by mouth 3 (three) times daily as needed for dizziness. Tina Anes, MD  Active   nitroGLYCERIN (NITROSTAT) 0.4 MG SL tablet [id number]  Place 1 tablet (0.4 mg total) under the tongue every 5 (five) minutes as needed for chest pain. Tobb, Kardie, DO  Expired 11/02/21 2359   pravastatin (PRAVACHOL) 40 MG tablet [id number] Yes Take 1 tablet (40 mg total) by mouth daily. Tina Anes, MD Taking Active   triamcinolone cream (KENALOG) 0.1 % [id number]  Apply 1 application topically 2 (two) times daily. Tina Anes, MD  Active             Patient Active Problem List   Diagnosis Date Noted   Hypovitaminosis D 10/29/2021   Chronic bilateral low back pain with bilateral sciatica 09/15/2021   Elevated blood pressure 09/15/2021   Neck pain 09/15/2021   Pain medication agreement 09/15/2021   PAT (paroxysmal atrial tachycardia) ([place]) 09/15/2020   Second degree AV block, Mobitz type I 09/15/2020   Essential hypertension 04/10/2020   Prediabetes 12/28/2019   Mixed hyperlipidemia 12/14/2019   Patent foramen ovale 12/14/2019   Sequelae of cerebral infarction 12/14/2019   History of hepatitis 12/14/2019   Spinal stenosis, lumbar region with neurogenic claudication 02/28/2019   Coronary artery disease involving native coronary artery of native heart without angina pectoris 05/10/2016     There is no immunization history on file for this patient.  Conditions to be addressed/monitored:  Hypertension and Hyperlipidemia  Care Plan : [place]  Updates made by Tina Perkins, [place] since  11/18/2021 12:00 AM     Problem: Hypertension, Lipids   Priority: High  Onset Date: 08/13/2021     Goal: Disease State Management  Start Date: 08/13/2021  Expected End Date: 08/13/2022  Recent Progress: On track  Priority: High  Note:   Current Barriers:  Does not contact provider office for questions/concerns  Pharmacist Clinical Goal(s):  Patient will contact provider office for questions/concerns as evidenced notation of same in electronic health record through collaboration with PharmD and provider.   Interventions: 1:1 collaboration with Tina Anes, MD regarding development and update of comprehensive plan of care as evidenced by provider attestation and co-signature Inter-disciplinary care team collaboration (see longitudinal plan of care) Comprehensive medication review performed; medication list updated in electronic medical record  Hypertension (BP goal <140/90) BP Readings from Last 3 Encounters:  08/11/21 (!) 142/82  07/28/21 (!) 146/80  02/12/21 124/80  -Uncontrolled -Current treatment: Amlodpine 2.104m Appropriate, Effective, Safe, Accessible -Medications previously tried: Metoprolol 256m(take 1/2)  -Current home readings: Doesn't test -Current dietary habits: "Tries to eat healthy" -Current exercise habits: None, tired just standing -Denies hypotensive/hypertensive symptoms -Educated on Symptoms of hypotension and importance of maintaining adequate hydration; -Counseled to monitor BP at home weekly, document, and provide log at future appointments November 2022: Patient does not like taking medicine. She was told, "Take this BP med and it'll slow your heart down." She got scared because she thought, "How does it know to stop, what if it slows down too much" and she never took it. Spent majority of visit talking about hypotension and S/S  CAD: (LDL goal < 100) The ASCVD Risk score (Arnett DK, et al., 2019) failed to calculate for the following  reasons:   The 2019 ASCVD risk score is only valid for ages 4032o 7957 The patient has a prior MI or stroke diagnosis Lab Results  Component Value Date   CHOL 172 07/28/2021   CHOL 173 07/31/2020   CHOL 186 03/31/2020   Lab Results  Component Value Date   HDL 41 07/28/2021   HDL 46 07/31/2020   HDL 46 03/31/2020   Lab Results  Component Value Date   LDLCALC 97 07/28/2021   LDLCALC 100 (H) 07/31/2020   LDLCALC 113 (H) 03/31/2020   Lab Results  Component Value Date   TRIG 196 (H) 07/28/2021   TRIG 154 (H) 07/31/2020   TRIG 150 (H) 03/31/2020   Lab Results  Component Value Date   CHOLHDL 4.2 07/28/2021   CHOLHDL 3.8 07/31/2020   CHOLHDL 4.0 03/31/2020  No results found for: LDLDIRECT -Controlled -Current treatment: Pravastatin 4029mppropriate, Effective, Safe, Accessible ASA 75m40mpropriate, Effective, Safe, Accessible -Medications previously tried: N/A  -Current dietary habits: "Tries to eat healthy" -Current exercise habits: None, tired just standing -Educated on Cholesterol goals;  Feb 2023: Convinced patient to take ASA 75mg69m325mg 65mCardio note from Jan 2023  Misc: -Patient states she's tired all the time and has gained 50 pounds and doesn't know why. [place] told her that this could be a side effect of the procedure and that she'd need imaging if it happens. I asked her if she told Dr. Perry Henrene Pastorhe said no. Plan: Patient needs an AWV, will try to get that and a f/u with Dr. Perry Henrene Pastore can be referred to imaging/specialist if needed  Patient Goals/Self-Care Activities Patient will:  - take medications as prescribed as evidenced by patient report and record review  Follow Up Plan: The patient has been provided with contact information for the care management team and has been advised  to call with any health related questions or concerns.   CPP F/U: Monthly for refills  [place] Constable, Pharm.D. -  [phone]        Medication Assistance: None required.  Patient affirms current coverage meets needs.  Compliance/Adherence/Medication fill history: Care Gaps: Tdap overdue Shingrix overdue Dexascan overdue AWV 06-18-2021 no show   Star Rating Drugs: Pravastatin 40 mg- Last filled 07-28-2021 90 DS  Patient's preferred pharmacy is:  [id number] [address], [address] [address] Phone: [phone] Fax: [phone]  [facility] - [place], [place] - [address] [place] [address] Phone: [phone] Fax: [phone]  Uses pill box? No - on 2 meds Pt endorses 100% compliance  We discussed: Benefits of medication synchronization, packaging and delivery as well as enhanced pharmacist oversight with [facility]. Patient decided to: Utilize [facility] for medication synchronization, packaging and delivery Plan: Verbal consent obtained for [facility] enhanced pharmacy services (medication synchronization, adherence packaging, delivery coordination). A medication sync plan was created to allow patient to get all medications delivered once every 30 to 90 days per patient preference. Patient understands they have freedom to choose pharmacy and clinical pharmacist will coordinate care between all prescribers and [facility].  Care Plan and Follow Up Patient Decision:  Patient agrees to Care Plan and Follow-up.  Plan: The patient has been provided with contact information for the care management team and has been advised to call with any health related questions or concerns.   [place] Constable, Pharm.D. - [phone]  CPP F/U December 2023

## 2021-11-19 ENCOUNTER — Ambulatory Visit: Payer: Medicare HMO

## 2021-11-19 DIAGNOSIS — I10 Essential (primary) hypertension: Secondary | ICD-10-CM

## 2021-11-19 DIAGNOSIS — E782 Mixed hyperlipidemia: Secondary | ICD-10-CM

## 2021-11-19 DIAGNOSIS — M5441 Lumbago with sciatica, right side: Secondary | ICD-10-CM

## 2021-11-19 DIAGNOSIS — M48062 Spinal stenosis, lumbar region with neurogenic claudication: Secondary | ICD-10-CM

## 2021-11-19 DIAGNOSIS — R7303 Prediabetes: Secondary | ICD-10-CM

## 2021-11-19 DIAGNOSIS — G8929 Other chronic pain: Secondary | ICD-10-CM

## 2021-11-19 NOTE — Patient Instructions (Signed)
Visit Information  Thank you for taking time to visit with me today. Please don't hesitate to contact me if I can be of assistance to you before our next scheduled telephone appointment.  Following are the goals we discussed today:  Take all medications as prescribed Attend all scheduled provider appointments Call pharmacy for medication refills 3-7 days in advance of running out of medications Call provider office for new concerns or questions  Work with the social worker to address care coordination needs and will continue to work with the clinical team to address health care and disease management related needs call the Suicide and Crisis Lifeline: 988 call the [place] National Suicide Prevention Lifeline: 781 592 8133 or TTY: 862-807-1899 TTY (561) 870-6766) to talk to a trained counselor call 1-800-273-TALK (toll free, 24 hour hotline) call 911 if experiencing a Mental Health or Behavioral Health Crisis  check blood pressure daily choose a place to take my blood pressure (home, clinic or office, retail store) write blood pressure results in a log or diary keep a blood pressure log take blood pressure log to all doctor appointments call doctor for signs and symptoms of high blood pressure keep all doctor appointments take medications for blood pressure exactly as prescribed report new symptoms to your doctor Monitor your blood sugar as directed from MD.  Follow DM diet Call MD office and make an appointment  to be seen for vaginal odor.   Our next appointment is by telephone on 02/16/2022 at 1:30pm  Please call the care guide team at [phone] if you need to cancel or reschedule your appointment.   If you are experiencing a Mental Health or Behavioral Health Crisis or need someone to talk to, please call the Suicide and Crisis Lifeline: 988 call the [place] National Suicide Prevention Lifeline: 540-042-6364 or TTY: 813-291-2822 TTY 616-098-0115) to talk to a trained  counselor call 1-800-273-TALK (toll free, 24 hour hotline) call 911   The patient verbalized understanding of instructions, educational materials, and care plan provided today and agreed to receive a mailed copy of patient instructions, educational materials, and care plan.   Rowe Pavy RN, BSN, CEN RN Case Production designer, theatre/television/film - [facility]: [phone]

## 2021-11-19 NOTE — Chronic Care Management (AMB) (Signed)
Chronic Care Management   CCM RN Visit Note  11/19/2021 Name: Tina Perkins MRN: [id number] DOB: [birth year]  Subjective: Tina Perkins is a 81 y.o. year old female who is a primary care patient of Lillard Anes, MD. The care management team was consulted for assistance with disease management and care coordination needs.    Engaged with patient by telephone for follow up visit in response to provider referral for case management and/or care coordination services.   Consent to Services:  The patient was given information about Chronic Care Management services, agreed to services, and gave verbal consent prior to initiation of services.  Please see initial visit note for detailed documentation.   Patient agreed to services and verbal consent obtained.   Assessment: Review of patient past medical history, allergies, medications, health status, including review of consultants reports, laboratory and other test data, was performed as part of comprehensive evaluation and provision of chronic care management services.   SDOH (Social Determinants of Health) assessments and interventions performed:    CCM Care Plan  Allergies  Allergen Reactions   Sulfa Antibiotics Hives    Outpatient Encounter Medications as of 11/19/2021  Medication Sig   acetaminophen (TYLENOL) 500 MG tablet Take 500 mg by mouth every 6 (six) hours as needed for mild pain or moderate pain. Takes  as needed for pain   amLODipine (NORVASC) 2.5 MG tablet Take 1 tablet (2.5 mg total) by mouth daily.   aspirin 325 MG tablet Take 325 mg by mouth every 6 (six) hours as needed for mild pain or moderate pain. Patient reports taking aspirin 2 tablets in the am, 2 tablets in the afternoon and 2 tablets at bedtime. (Patient not taking: Reported on 11/18/2021)   aspirin EC 81 MG tablet Take 81 mg by mouth daily. Swallow whole.   gabapentin (NEURONTIN) 100 MG capsule Take 100 mg by mouth at bedtime.   meclizine (ANTIVERT) 12.5  MG tablet Take 1 tablet (12.5 mg total) by mouth 3 (three) times daily as needed for dizziness.   nitroGLYCERIN (NITROSTAT) 0.4 MG SL tablet Place 1 tablet (0.4 mg total) under the tongue every 5 (five) minutes as needed for chest pain.   pravastatin (PRAVACHOL) 40 MG tablet Take 1 tablet (40 mg total) by mouth daily.   triamcinolone cream (KENALOG) 0.1 % Apply 1 application topically 2 (two) times daily.   No facility-administered encounter medications on file as of 11/19/2021.    Patient Active Problem List   Diagnosis Date Noted   Hypovitaminosis D 10/29/2021   Chronic bilateral low back pain with bilateral sciatica 09/15/2021   Elevated blood pressure 09/15/2021   Neck pain 09/15/2021   Pain medication agreement 09/15/2021   PAT (paroxysmal atrial tachycardia) ([place]) 09/15/2020   Second degree AV block, Mobitz type I 09/15/2020   Essential hypertension 04/10/2020   Prediabetes 12/28/2019   Mixed hyperlipidemia 12/14/2019   Patent foramen ovale 12/14/2019   Sequelae of cerebral infarction 12/14/2019   History of hepatitis 12/14/2019   Spinal stenosis, lumbar region with neurogenic claudication 02/28/2019   Coronary artery disease involving native coronary artery of native heart without angina pectoris 05/10/2016    Conditions to be addressed/monitored:HTN, HLD, and chronic pain, vaginal odor  Care Plan : RN Care manager plan of care  Updates made by Thana Ates, RN since 11/19/2021 12:00 AM     Problem: No plan of care established for chronic disease states (Hypertension, chronic pain)   Priority: High  Onset Date: 09/17/2021  Long-Range Goal: Development of plan of care for chonic disease management ( hypertension, HLD and chronic pain)   Start Date: 09/17/2021  Expected End Date: 09/17/2022  Priority: High  Note:   Current Barriers:  Chronic Disease Management support and education needs related to HTN, HLD, and no advanced directives, chronic pain    09/17/2021   Patient reports that she is self monitoring her blood pressure. Reports range of 136-140/80.  Reports she is taking her medications as prescribed. Reports she saw her pain doctor yesterday and is have x rays. Has follow up planned for 1 month. Reports she received her advanced directive packet in the mail but has not completed it.  11/19/2021  Chronic pain- reports pain of 6/10 today. Reports she has not taking anything for pain in 3 days.  Denies any recent falls.  HTN- reports she continues to take her medications as prescribed. Self monitors BP every couple of days. Last reading of 127/84.  Reports that she followed up with cardiology. Denies any recent chest pain.  PreDM- reports that she self monitors when she feels bad. Reports last times was 2 weeks ago and she can not give me a number. Diet controlled.  HLD- continues to take her medications as prescribed.  New concern- reports that she has a vaginal odor. Denies dysuria, denies discharge. Reports she continues to wash but odor will not go away.   RNCM Clinical Goal(s):  Patient will take all medications exactly as prescribed and will call provider for medication related questions as evidenced by patient report of taking his medications correctly attend all scheduled medical appointments: PCP as evidenced by review of office notes continue to work with RN Care Manager to address care management and care coordination needs related to  HTN and chronic pain as evidenced by adherence to CM Team Scheduled appointments through collaboration with RN Care manager, provider, and care team.   Interventions: 1:1 collaboration with primary care provider regarding development and update of comprehensive plan of care as evidenced by provider attestation and co-signature Inter-disciplinary care team collaboration (see longitudinal plan of care) Evaluation of current treatment plan related to  self management and patient's adherence to plan as established by  provider   Hyperlipidemia Interventions:  (Status:  New goal.) Long Term Goal Medication review performed; medication list updated in electronic medical record.  Counseled on importance of regular laboratory monitoring as prescribed Encouraged patient to take her medications as prescribed.    Hypertension Interventions:  (Status:  Goal on track:  Yes.) Long Term Goal Last practice recorded BP readings:  BP Readings from Last 3 Encounters:  09/17/21 136/80  08/11/21 (!) 142/82  07/28/21 (!) 146/80  Most recent eGFR/CrCl:  Lab Results  Component Value Date   EGFR 88 07/28/2021    No components found for: CRCL  Evaluation of current treatment plan related to hypertension self management and patient's adherence to plan as established by provider Reviewed medications with patient and discussed importance of compliance Discussed plans with patient for ongoing care management follow up and provided patient with direct contact information for care management team Advised patient, providing education and rationale, to monitor blood pressure daily and record, calling PCP for findings outside established parameters Reviewed scheduled/upcoming provider appointments including:  Discussed complications of poorly controlled blood pressure such as heart disease, stroke, circulatory complications, vision complications, kidney impairment, sexual dysfunction  Pain Interventions:  (Status:  Goal on track:  Yes.) Long Term Goal Pain assessment performed Medications reviewed Reviewed provider  established plan for pain management Discussed importance of adherence to all scheduled medical appointments Counseled on the importance of reporting any/all new or changed pain symptoms or management strategies to pain management provider   Today's Vitals   11/19/21 1331  PainSc: 6     Vaginal Odor  (Status:  New goal.)  Short Term Goal  Evaluation of current treatment plan related to  vaginal odor,   self-management and patient's adherence to plan as established by provider. Discussed plans with patient for ongoing care management follow up and provided patient with direct contact information for care management team Advised patient to call MD office and make an appointment.  Assessed for urinary symptoms and vaginal discharge.    Patient Goals/Self-Care Activities: Take all medications as prescribed Attend all scheduled provider appointments Call pharmacy for medication refills 3-7 days in advance of running out of medications Call provider office for new concerns or questions  Work with the social worker to address care coordination needs and will continue to work with the clinical team to address health care and disease management related needs call the Suicide and Crisis Lifeline: 988 call the [place] National Suicide Prevention Lifeline: (919)630-5856 or TTY: 970 474 7103 TTY 229-630-5580) to talk to a trained counselor call 1-800-273-TALK (toll free, 24 hour hotline) call 911 if experiencing a Mental Health or [place]  check blood pressure daily choose a place to take my blood pressure (home, clinic or office, retail store) write blood pressure results in a log or diary keep a blood pressure log take blood pressure log to all doctor appointments call doctor for signs and symptoms of high blood pressure keep all doctor appointments take medications for blood pressure exactly as prescribed report new symptoms to your doctor Monitor your blood sugar as directed from MD.  Follow DM diet Call MD office and make an appointment  to be seen for vaginal odor.        Plan:Telephone follow up appointment with care management team member scheduled for:  02/16/2022  at 130pm  Tomasa Rand RN, BSN, CEN RN Case Manager - [facility] Mobile: [phone]

## 2021-11-20 ENCOUNTER — Other Ambulatory Visit: Payer: Self-pay | Admitting: *Deleted

## 2021-11-20 DIAGNOSIS — I10 Essential (primary) hypertension: Secondary | ICD-10-CM

## 2021-11-23 ENCOUNTER — Telehealth: Payer: Self-pay

## 2021-11-23 NOTE — Telephone Encounter (Signed)
° °  Telephone encounter was:  Successful.  11/23/2021 Name: Ishani Goldwasser MRN: [id number] DOB: [birth year]  Makalya Nave is a 81 y.o. year old female who is a primary care patient of [name], MD . The community resource team was consulted for assistance with Transportation Needs   Care guide performed the following interventions: Patient provided with information about care guide support team and interviewed to confirm resource needs.Resources for Living through [employer] provided resources for her as well as the resources I mailed to Patient  Follow Up Plan:  WILL FOLLOW UP NEXT WEEK   Lenard Forth Care Guide, Embedded Care Coordination [facility], Care Management  [phone] [address] [place], [place], [place] [address] Phone: [phone] Email: [email or web address]

## 2021-11-23 NOTE — Telephone Encounter (Signed)
° °  Telephone encounter was:  Successful.  11/23/2021 Name: Jerlean Peralta MRN: [id number] DOB: [birth year]  Latina Frank is a 81 y.o. year old female who is a primary care patient of [name], MD . The community resource team was consulted for assistance with Transportation Needs  and Food Insecurity  Care guide performed the following interventions: Patient provided with information about care guide support team and interviewed to confirm resource needs. Pt stated she needs transportation for dr visits as well as some food resources. I will mail resources to her and follow back up in a week         Follow Up Plan:  Care guide will follow up with patient by phone over the next week    [facility] Guide, Embedded Care Coordination [facility], Care Management  [phone] [address] [place], [place], [place] [address] Phone: [phone] Email: [email or web address]

## 2021-12-01 DIAGNOSIS — E782 Mixed hyperlipidemia: Secondary | ICD-10-CM | POA: Diagnosis not present

## 2021-12-01 DIAGNOSIS — I10 Essential (primary) hypertension: Secondary | ICD-10-CM

## 2021-12-01 DIAGNOSIS — I2510 Atherosclerotic heart disease of native coronary artery without angina pectoris: Secondary | ICD-10-CM | POA: Diagnosis not present

## 2021-12-03 ENCOUNTER — Telehealth: Payer: Medicare HMO | Admitting: *Deleted

## 2021-12-10 ENCOUNTER — Telehealth: Payer: Self-pay

## 2021-12-10 NOTE — Telephone Encounter (Signed)
? ?  Telephone encounter was:  Successful.  ?12/10/2021 ?Name: Tina Perkins MRN: [id number] DOB: May 30, 1941 ? ?Tina Perkins is a 81 y.o. year old female who is a primary care patient of [name], MD . The community resource team was consulted for assistance with Transportation Needs  ? ?Care guide performed the following interventions: Patient provided with information about care guide support team and interviewed to confirm resource needs.Follow up for mailed resources patient did receive the resources ? ? ?Follow Up Plan:  No further follow up planned at this time. The patient has been provided with needed resources. ? ? ? ?Lenard Forth ?Care Guide, Embedded Care Coordination ?[place], Care Management  ?[phone] ?[address] [place], [place], [place] [address] ?Phone: [phone] ?Email: [email or web address] ? ?  ?

## 2021-12-11 ENCOUNTER — Telehealth: Payer: Self-pay

## 2021-12-11 NOTE — Chronic Care Management (AMB) (Signed)
? ? ?  Chronic Care Management ?Pharmacy Assistant  ? ?Name: Tina Perkins  MRN: [id number] DOB: 1941/06/08 ? ? ?Reason for Encounter: Medication Coordination for [facility]  ?  ?Recent office visits:  ?None ? ?Recent consult visits:  ?None ? ?Hospital visits:  ?None ? ?Medications: ?Outpatient Encounter Medications as of 12/11/2021  ?Medication Sig  ? acetaminophen (TYLENOL) 500 MG tablet Take 500 mg by mouth every 6 (six) hours as needed for mild pain or moderate pain. Takes  as needed for pain  ? amLODipine (NORVASC) 2.5 MG tablet Take 1 tablet (2.5 mg total) by mouth daily.  ? aspirin 325 MG tablet Take 325 mg by mouth every 6 (six) hours as needed for mild pain or moderate pain. Patient reports taking aspirin 2 tablets in the am, 2 tablets in the afternoon and 2 tablets at bedtime. (Patient not taking: Reported on 11/18/2021)  ? aspirin EC 81 MG tablet Take 81 mg by mouth daily. Swallow whole.  ? gabapentin (NEURONTIN) 100 MG capsule Take 100 mg by mouth at bedtime.  ? meclizine (ANTIVERT) 12.5 MG tablet Take 1 tablet (12.5 mg total) by mouth 3 (three) times daily as needed for dizziness.  ? nitroGLYCERIN (NITROSTAT) 0.4 MG SL tablet Place 1 tablet (0.4 mg total) under the tongue every 5 (five) minutes as needed for chest pain.  ? pravastatin (PRAVACHOL) 40 MG tablet Take 1 tablet (40 mg total) by mouth daily.  ? triamcinolone cream (KENALOG) 0.1 % Apply 1 application topically 2 (two) times daily.  ? ?No facility-administered encounter medications on file as of 12/11/2021.  ? ? ?Reviewed chart for medication changes ahead of medication coordination call. ? ?No OVs, Consults, or hospital visits since last care coordination call/Pharmacist visit.  ? ?No medication changes indicated OR if recent visit, treatment plan here. ? ?BP Readings from Last 3 Encounters:  ?11/02/21 136/80  ?10/29/21 110/80  ?09/17/21 136/80  ?  ?Lab Results  ?Component Value Date  ? HGBA1C 6.8 (H) 10/29/2021  ?  ? ?Patient obtains medications  through Vials  30 Days  ? ?Last adherence delivery included:  ?Pravastatin 40 mg  daily  ?Amlodipine 2.5 mg daily  ? ?Patient declined (meds) last month  ?None ? ?Patient is due for next adherence delivery on: 12/23/21. ?Called patient and reviewed medications and coordinated delivery. ? ?This delivery to include: ?Pravastatin 40 mg  daily  ?Amlodipine 2.5 mg daily  ?Aspirin 81mg  1 daily  ? ?Patient declined the following medications  ?Gabapentin 100mg - Gets through . Pt will speak with provider on the 3/14 about sending [employer] a supply to start filling.  ?Meclizine 12.5mg  - Takes 3 times daily prn- Does not need but will like next delivery  ? ?Patient needs refills  ?None ? ?Confirmed delivery date of 12/23/21, advised patient that pharmacy will contact them the morning of delivery. ? ? ?[place], CMA ?Clinical Pharmacist Assistant  ?[phone]  ?

## 2021-12-14 NOTE — Telephone Encounter (Signed)
Compliant on meds 

## 2021-12-15 DIAGNOSIS — E66812 Obesity, class 2: Secondary | ICD-10-CM | POA: Insufficient documentation

## 2021-12-15 DIAGNOSIS — G8929 Other chronic pain: Secondary | ICD-10-CM | POA: Diagnosis not present

## 2021-12-15 DIAGNOSIS — M4306 Spondylolysis, lumbar region: Secondary | ICD-10-CM | POA: Insufficient documentation

## 2021-12-15 DIAGNOSIS — Z79891 Long term (current) use of opiate analgesic: Secondary | ICD-10-CM | POA: Diagnosis not present

## 2021-12-15 DIAGNOSIS — M4726 Other spondylosis with radiculopathy, lumbar region: Secondary | ICD-10-CM | POA: Diagnosis not present

## 2021-12-15 DIAGNOSIS — Z79899 Other long term (current) drug therapy: Secondary | ICD-10-CM | POA: Diagnosis not present

## 2021-12-15 DIAGNOSIS — Z6836 Body mass index (BMI) 36.0-36.9, adult: Secondary | ICD-10-CM | POA: Insufficient documentation

## 2021-12-15 DIAGNOSIS — M5031 Other cervical disc degeneration,  high cervical region: Secondary | ICD-10-CM | POA: Diagnosis not present

## 2021-12-15 DIAGNOSIS — E6609 Other obesity due to excess calories: Secondary | ICD-10-CM | POA: Diagnosis not present

## 2021-12-15 DIAGNOSIS — M4722 Other spondylosis with radiculopathy, cervical region: Secondary | ICD-10-CM | POA: Insufficient documentation

## 2021-12-15 DIAGNOSIS — Z760 Encounter for issue of repeat prescription: Secondary | ICD-10-CM | POA: Diagnosis not present

## 2021-12-15 DIAGNOSIS — M5030 Other cervical disc degeneration, unspecified cervical region: Secondary | ICD-10-CM | POA: Insufficient documentation

## 2022-01-11 ENCOUNTER — Telehealth: Payer: Self-pay

## 2022-01-11 NOTE — Progress Notes (Signed)
? ? ?  Chronic Care Management ?Pharmacy Assistant  ? ?Name: Tina Perkins  MRN: [id number] DOB: 04-03-1941 ? ? ?Reason for Encounter: Medication Coordination for [facility]  ?  ?Recent office visits:  ?None ? ?Recent consult visits:  ?[facility]  Valetta Close FNP. Seen for Cervicalgia. No med changes. ? ?Hospital visits:  ?None ? ?Medications: ?Outpatient Encounter Medications as of 01/11/2022  ?Medication Sig  ? acetaminophen (TYLENOL) 500 MG tablet Take 500 mg by mouth every 6 (six) hours as needed for mild pain or moderate pain. Takes  as needed for pain  ? amLODipine (NORVASC) 2.5 MG tablet Take 1 tablet (2.5 mg total) by mouth daily.  ? aspirin 325 MG tablet Take 325 mg by mouth every 6 (six) hours as needed for mild pain or moderate pain. Patient reports taking aspirin 2 tablets in the am, 2 tablets in the afternoon and 2 tablets at bedtime. (Patient not taking: Reported on 11/18/2021)  ? aspirin EC 81 MG tablet Take 81 mg by mouth daily. Swallow whole.  ? gabapentin (NEURONTIN) 100 MG capsule Take 100 mg by mouth at bedtime.  ? meclizine (ANTIVERT) 12.5 MG tablet Take 1 tablet (12.5 mg total) by mouth 3 (three) times daily as needed for dizziness.  ? nitroGLYCERIN (NITROSTAT) 0.4 MG SL tablet Place 1 tablet (0.4 mg total) under the tongue every 5 (five) minutes as needed for chest pain.  ? pravastatin (PRAVACHOL) 40 MG tablet Take 1 tablet (40 mg total) by mouth daily.  ? triamcinolone cream (KENALOG) 0.1 % Apply 1 application topically 2 (two) times daily.  ? ?No facility-administered encounter medications on file as of 01/11/2022.  ? ? ?Reviewed chart for medication changes ahead of medication coordination call. ? ?No OVs, or hospital visits since last care coordination call/Pharmacist visit.  ? ?No medication changes indicated OR if recent visit, treatment plan here. ? ?BP Readings from Last 3 Encounters:  ?11/02/21 136/80  ?10/29/21 110/80  ?09/17/21 136/80  ?  ?Lab Results  ?Component Value Date   ? HGBA1C 6.8 (H) 10/29/2021  ?  ? ?Patient obtains medications through Vials  30 Days  ? ?Last adherence delivery included:  ?Pravastatin 40 mg  daily  ?Amlodipine 2.5 mg daily  ?Aspirin 81mg  1 daily  ? ?Patient declined (meds) last month  ?Gabapentin 100mg - Gets through . Pt will speak with provider on the 3/14 about sending [employer] a supply to start filling.  ?Meclizine 12.5mg  - Takes 3 times daily prn- Does not need but will like next delivery  ? ?Patient is due for next adherence delivery on: 01/21/22. ?Called patient and reviewed medications and coordinated delivery. ? ?This delivery to include: ?Pravastatin 40 mg  daily  ?Amlodipine 2.5 mg daily  ?Aspirin 81mg  1 daily  ? ?Patient declined the following medications  ?Gabapentin 100mg - Gets through [place].  ?Meclizine 12.5mg  - Takes 3 times daily prn- Does not need but will like next delivery  ? ?Patient needs refills  ?None ? ?Confirmed delivery date of 01/21/22, advised patient that pharmacy will contact them the morning of delivery. ? ? ? , CMA ?Clinical Pharmacist Assistant  ?[phone]  ?

## 2022-02-10 ENCOUNTER — Telehealth: Payer: Self-pay

## 2022-02-10 NOTE — Progress Notes (Signed)
? ? ?  Chronic Care Management ?Pharmacy Assistant  ? ?Name: Samyukta Cura  MRN: [id number] DOB: [birth year] ? ? ?Reason for Encounter: Medication Coordination for [facility]  ? ?Recent office visits:  ?None ? ?Recent consult visits:  ?None ? ?Hospital visits:  ?None ? ?Medications: ?Outpatient Encounter Medications as of 02/10/2022  ?Medication Sig  ? acetaminophen (TYLENOL) 500 MG tablet Take 500 mg by mouth every 6 (six) hours as needed for mild pain or moderate pain. Takes  as needed for pain  ? amLODipine (NORVASC) 2.5 MG tablet Take 1 tablet (2.5 mg total) by mouth daily.  ? aspirin 325 MG tablet Take 325 mg by mouth every 6 (six) hours as needed for mild pain or moderate pain. Patient reports taking aspirin 2 tablets in the am, 2 tablets in the afternoon and 2 tablets at bedtime. (Patient not taking: Reported on 11/18/2021)  ? aspirin EC 81 MG tablet Take 81 mg by mouth daily. Swallow whole.  ? gabapentin (NEURONTIN) 100 MG capsule Take 100 mg by mouth at bedtime.  ? meclizine (ANTIVERT) 12.5 MG tablet Take 1 tablet (12.5 mg total) by mouth 3 (three) times daily as needed for dizziness.  ? nitroGLYCERIN (NITROSTAT) 0.4 MG SL tablet Place 1 tablet (0.4 mg total) under the tongue every 5 (five) minutes as needed for chest pain.  ? pravastatin (PRAVACHOL) 40 MG tablet Take 1 tablet (40 mg total) by mouth daily.  ? triamcinolone cream (KENALOG) 0.1 % Apply 1 application topically 2 (two) times daily.  ? ?No facility-administered encounter medications on file as of 02/10/2022.  ? ? ?Reviewed chart for medication changes ahead of medication coordination call. ? ?No OVs, Consults, or hospital visits since last care coordination call/Pharmacist visit.  ? ?No medication changes indicated OR if recent visit, treatment plan here. ? ?BP Readings from Last 3 Encounters:  ?11/02/21 136/80  ?10/29/21 110/80  ?09/17/21 136/80  ?  ?Lab Results  ?Component Value Date  ? HGBA1C 6.8 (H) 10/29/2021  ?  ? ?Patient obtains medications  through Vials  30 Days  ? ?Last adherence delivery included:  ?Pravastatin 40 mg  daily  ?Amlodipine 2.5 mg daily  ?Aspirin 81mg  1 daily  ? ?Patient declined (meds) last month  ?Gabapentin 100mg - Gets through .  ?Meclizine 12.5mg  - Takes 3 times daily prn- Does not need but will like next delivery  ? ?Patient is due for next adherence delivery on: 02/22/22. ?Called patient and reviewed medications and coordinated delivery. ? ?This delivery to include: ?Pravastatin 40 mg  daily  ?Amlodipine 2.5 mg daily  ?Aspirin 81mg  1 daily  ? ?Patient declined the following medications  ?Gabapentin 100mg - Gets through [employer]. Filled on 01/25/22 30ds ?Meclizine 12.5mg  - Takes 3 times daily prn- Does not need, still has enough supply on hand ? ?Patient needs refills  ?None ? ?Confirmed delivery date of 02/22/22, advised patient that pharmacy will contact them the morning of delivery. ? ? , CMA ?Clinical Pharmacist Assistant  ?[phone]  ?

## 2022-02-15 ENCOUNTER — Ambulatory Visit (INDEPENDENT_AMBULATORY_CARE_PROVIDER_SITE_OTHER): Payer: Medicare HMO

## 2022-02-15 DIAGNOSIS — I2510 Atherosclerotic heart disease of native coronary artery without angina pectoris: Secondary | ICD-10-CM

## 2022-02-15 DIAGNOSIS — I10 Essential (primary) hypertension: Secondary | ICD-10-CM

## 2022-02-15 DIAGNOSIS — M48062 Spinal stenosis, lumbar region with neurogenic claudication: Secondary | ICD-10-CM

## 2022-02-15 DIAGNOSIS — R7303 Prediabetes: Secondary | ICD-10-CM

## 2022-02-15 DIAGNOSIS — I471 Supraventricular tachycardia: Secondary | ICD-10-CM

## 2022-02-15 NOTE — Patient Instructions (Addendum)
Visit Information ? ?Thank you for taking time to visit with me today. Please don't hesitate to contact me if I can be of assistance to you before our next scheduled telephone appointment. ? ?Following are the goals we discussed today:  ?Take all medications as prescribed ?Attend all scheduled provider appointments ?Call pharmacy for medication refills 3-7 days in advance of running out of medications ?Call provider office for new concerns or questions  ?Work with the Education officer, museum to address care coordination needs and will continue to work with the clinical team to address health care and disease management related needs ?check blood pressure daily ?write blood pressure results in a log or diary ?take blood pressure log to all doctor appointments ?call doctor for signs and symptoms of high blood pressure ?take medications for blood pressure exactly as prescribed ?report new symptoms to your doctor ? ?Our next appointment is by telephone on 04/20/22 at 1:00 pm ? ?Please call the care guide team at [phone] if you need to cancel or reschedule your appointment.  ? ?If you are experiencing a Mental Health or [place] or need someone to talk to, please call the Suicide and Crisis Lifeline: 988 ?call 1-800-273-TALK (toll free, 24 hour hotline)  ? ?The patient verbalized understanding of instructions, educational materials, and care plan provided today and agreed to receive a mailed copy of patient instructions, educational materials, and care plan.  ? ?Quinn Plowman RN,BSN,CCM (covering for [employer], [facility]) ?RN Case Manager ?[phone] ? ?

## 2022-02-15 NOTE — Chronic Care Management (AMB) (Signed)
?Chronic Care Management  ? ?CCM RN Visit Note ? ?02/15/2022 ?Name: Tina Perkins MRN: [id number] DOB: 1941-03-12 ? ?Subjective: ?Tina Perkins is a 81 y.o. year old female who is a primary care patient of Lillard Anes, MD. The care management team was consulted for assistance with disease management and care coordination needs.   ? ?Engaged with patient by telephone for follow up visit in response to provider referral for case management and/or care coordination services.  ? ?Consent to Services:  ?The patient was given information about Chronic Care Management services, agreed to services, and gave verbal consent prior to initiation of services.  Please see initial visit note for detailed documentation.  ? ?Patient agreed to services and verbal consent obtained.  ? ?Assessment: Review of patient past medical history, allergies, medications, health status, including review of consultants reports, laboratory and other test data, was performed as part of comprehensive evaluation and provision of chronic care management services.  ? ?SDOH (Social Determinants of Health) assessments and interventions performed:   ? ?CCM Care Plan ? ?Allergies  ?Allergen Reactions  ? Sulfa Antibiotics Hives  ? ? ?Outpatient Encounter Medications as of 02/15/2022  ?Medication Sig  ? acetaminophen (TYLENOL) 500 MG tablet Take 500 mg by mouth every 6 (six) hours as needed for mild pain or moderate pain. Takes  as needed for pain  ? amLODipine (NORVASC) 2.5 MG tablet Take 1 tablet (2.5 mg total) by mouth daily.  ? aspirin 325 MG tablet Take 325 mg by mouth every 6 (six) hours as needed for mild pain or moderate pain. Patient reports taking aspirin 2 tablets in the am, 2 tablets in the afternoon and 2 tablets at bedtime. (Patient not taking: Reported on 11/18/2021)  ? aspirin EC 81 MG tablet Take 81 mg by mouth daily. Swallow whole.  ? gabapentin (NEURONTIN) 100 MG capsule Take 100 mg by mouth at bedtime.  ? meclizine (ANTIVERT) 12.5  MG tablet Take 1 tablet (12.5 mg total) by mouth 3 (three) times daily as needed for dizziness.  ? nitroGLYCERIN (NITROSTAT) 0.4 MG SL tablet Place 1 tablet (0.4 mg total) under the tongue every 5 (five) minutes as needed for chest pain.  ? pravastatin (PRAVACHOL) 40 MG tablet Take 1 tablet (40 mg total) by mouth daily.  ? triamcinolone cream (KENALOG) 0.1 % Apply 1 application topically 2 (two) times daily.  ? ?No facility-administered encounter medications on file as of 02/15/2022.  ? ? ?Patient Active Problem List  ? Diagnosis Date Noted  ? Hypovitaminosis D 10/29/2021  ? Chronic bilateral low back pain with bilateral sciatica 09/15/2021  ? Elevated blood pressure 09/15/2021  ? Neck pain 09/15/2021  ? Pain medication agreement 09/15/2021  ? PAT (paroxysmal atrial tachycardia) ([place]) 09/15/2020  ? Second degree AV block, Mobitz type I 09/15/2020  ? Essential hypertension 04/10/2020  ? Prediabetes 12/28/2019  ? Mixed hyperlipidemia 12/14/2019  ? Patent foramen ovale 12/14/2019  ? Sequelae of cerebral infarction 12/14/2019  ? History of hepatitis 12/14/2019  ? Spinal stenosis, lumbar region with neurogenic claudication 02/28/2019  ? Coronary artery disease involving native coronary artery of native heart without angina pectoris 05/10/2016  ? ? ?Conditions to be addressed/monitored:HTN, HLD, and pain ? ?Care Plan : RN Care manager plan of care  ?Updates made by Dannielle Karvonen, RN since 02/15/2022 12:00 AM  ?  ? ?Problem: No plan of care established for chronic disease states (Hypertension, chronic pain)   ?Priority: High  ?Onset Date: 09/17/2021  ?  ? ?  Long-Range Goal: Development of plan of care for chonic disease management ( hypertension, HLD and chronic pain)   ?Start Date: 09/17/2021  ?Expected End Date: 09/17/2022  ?Priority: High  ?Note:   ?Current Barriers:  ?Chronic Disease Management support and education needs related to HTN, HLD, and no advanced directives, chronic pain    ?09/17/2021  Patient reports that  she is self monitoring her blood pressure. Reports range of 136-140/80.  Reports she is taking her medications as prescribed. Reports she saw her pain doctor yesterday and is have x rays. Has follow up planned for 1 month. Reports she received her advanced directive packet in the mail but has not completed it.  ?02/15/21   Patient reports having follow up with pain management provider on 12/15/21.  She states she agreed to have cervical spine epidural injections and is currently waiting for an appointment with [facility].  Patient states her doctor restarted her on gabapentin and tramadol.  She reports her pain level today is an 8/10.   Patient states her blood pressure continues to be up and down.   Reports most recent readings ranging from 111/65 to 180/82.  She states she continues to take her medications as prescribed.   Patient states vaginal odor she reported to [facility] has resolved.  Per chart review patient has 3 month follow up appointment with primary care provider on 03/10/22.  Patient confirmed appointment.  ? ?RNCM Clinical Goal(s):  ?Patient will take all medications exactly as prescribed and will call provider for medication related questions as evidenced by patient report of taking his medications correctly ?attend all scheduled medical appointments: PCP as evidenced by review of office notes ?continue to work with RN Care Manager to address care management and care coordination needs related to  HTN and chronic pain as evidenced by adherence to CM Team Scheduled appointments through collaboration with RN Care manager, provider, and care team.  ? ?Interventions: ?1:1 collaboration with primary care provider regarding development and update of comprehensive plan of care as evidenced by provider attestation and co-signature ?Inter-disciplinary care team collaboration (see longitudinal plan of care) ?Evaluation of current treatment plan related to  self management and patient's adherence  to plan as established by provider ? ? ?Hyperlipidemia Interventions:  (Status:  Goal on track:  Yes.) Long Term Goal ?Medication review performed; medication list updated in electronic medical record.  ?Counseled on importance of regular laboratory monitoring as prescribed ?Encouraged patient to take her medications as prescribed.   ?Reviewed upcoming/ scheduled appointments.  ? ? ?Hypertension Interventions:  (Status:  Goal on track:  Yes.) Long Term Goal ?Last practice recorded BP readings:  ?BP Readings from Last 3 Encounters:  ?11/02/21 136/80  ?10/29/21 110/80  ?09/17/21 136/80  ?Most recent eGFR/CrCl:  ?Lab Results  ?Component Value Date  ? EGFR 78 10/29/2021  ?  No components found for: CRCL ? ?Evaluation of current treatment plan related to hypertension self management and patient's adherence to plan as established by provider ?Reviewed medications with patient and discussed importance of compliance ?Discussed plans with patient for ongoing care management follow up and provided patient with direct contact information for care management team ?Advised patient, providing education and rationale, to monitor blood pressure daily and record, calling PCP for findings outside established parameters ?Reviewed scheduled/upcoming provider appointments including:  ?Discussed complications of poorly controlled blood pressure such as heart disease, stroke, circulatory complications, vision complications, kidney impairment, sexual dysfunction ?Advised to follow a low salt diet.  ? ?  Pain Interventions:  (Status:  Goal on track:  Yes.) Long Term Goal ?Pain assessment performed ?Medications reviewed and discussed importance of compliance.  ?Reviewed provider established plan for pain management ?Discussed importance of adherence to all scheduled medical appointments ?Counseled on the importance of reporting any/all new or changed pain symptoms or management strategies to pain management provider ?Discussed upcoming /  scheduled appointments.  ? ? ?There were no vitals filed for this visit. ?  ?Vaginal Odor  (Status:   Resolved )  Short Term Goal ? ?Evaluation of current treatment plan related to  vaginal odor,  self-man

## 2022-02-16 ENCOUNTER — Telehealth: Payer: Medicare HMO

## 2022-02-18 ENCOUNTER — Ambulatory Visit: Payer: Medicare HMO | Admitting: Emergency Medicine

## 2022-02-18 ENCOUNTER — Encounter: Payer: Self-pay | Admitting: Emergency Medicine

## 2022-02-18 DIAGNOSIS — Z599 Problem related to housing and economic circumstances, unspecified: Secondary | ICD-10-CM

## 2022-02-18 DIAGNOSIS — Z0000 Encounter for general adult medical examination without abnormal findings: Secondary | ICD-10-CM | POA: Diagnosis not present

## 2022-02-18 NOTE — Progress Notes (Signed)
Subjective:   Tina Perkins is a 81 y.o. female who presents for Medicare Annual preventive examination - initial.   I connected with  Tina Perkins on 02/19/22 by a audio enabled telemedicine application and verified that I am speaking with the correct person using two identifiers.  Patient Location: Home  Provider Location: Home Office  I discussed the limitations of evaluation and management by telemedicine. The patient expressed understanding and agreed to proceed.   Tina Perkins is a 81 y.o. female who presents today for her Annual Wellness Visit.  The patient does not participate in regular exercise at present. She reports concern over weight gain (50-60 pounds) and feels weight gain is why she is not able to exercise. She also reports feeling SOB with exertion. She describes poor housing conditions - see below.         Objective:      BP Readings from Last 3 Encounters:  11/02/21 136/80  10/29/21 110/80  09/17/21 136/80   Wt Readings from Last 3 Encounters:  11/02/21 181 lb 6.4 oz (82.3 kg)  10/29/21 181 lb (82.1 kg)  08/20/21 182 lb (82.6 kg)        08/20/2021   10:19 AM 07/30/2021    5:54 PM 08/12/2019   11:02 PM  Advanced Directives  Does Patient Have a Medical Advance Directive? No No No  Would patient like information on creating a medical advance directive? Yes (ED - Information included in AVS) No - Patient declined No - Patient declined    Current Medications (verified) Outpatient Encounter Medications as of 02/18/2022  Medication Sig   acetaminophen (TYLENOL) 500 MG tablet Take 500 mg by mouth every 6 (six) hours as needed for mild pain or moderate pain. Takes  as needed for pain   amLODipine (NORVASC) 2.5 MG tablet Take 1 tablet (2.5 mg total) by mouth daily.   aspirin 325 MG tablet Take 325 mg by mouth every 6 (six) hours as needed for mild pain or moderate pain. Patient reports taking aspirin 2 tablets in the am, 2 tablets in the afternoon and  2 tablets at bedtime. (Patient not taking: Reported on 11/18/2021)   aspirin EC 81 MG tablet Take 81 mg by mouth daily. Swallow whole.   gabapentin (NEURONTIN) 100 MG capsule Take 100 mg by mouth at bedtime.   meclizine (ANTIVERT) 12.5 MG tablet Take 1 tablet (12.5 mg total) by mouth 3 (three) times daily as needed for dizziness.   nitroGLYCERIN (NITROSTAT) 0.4 MG SL tablet Place 1 tablet (0.4 mg total) under the tongue every 5 (five) minutes as needed for chest pain.   pravastatin (PRAVACHOL) 40 MG tablet Take 1 tablet (40 mg total) by mouth daily.   triamcinolone cream (KENALOG) 0.1 % Apply 1 application topically 2 (two) times daily.   No facility-administered encounter medications on file as of 02/18/2022.    Allergies (verified) Sulfa antibiotics   History: Past Medical History:  Diagnosis Date   Acute respiratory disease due to COVID-19 virus 08/12/2019   BMI 35.0-35.9,adult 03/31/2020   Chest pain, precordial 04/10/2020   Chronic bilateral low back pain with bilateral sciatica 09/15/2021   Last Assessment & Plan:  Formatting of this note might be different from the original. 81 year old female seen today in initial consultation for her chronic back pain with bilateral lower extremity radiculopathy, left greater than right.    Will request previous lumbar spine MRI for review.  At this time, I will start patient on gabapentin 100 mg  q.h.s. x2 weeks then increase her to 200 mg q.h.s.    Coronary artery disease involving native coronary artery of native heart without angina pectoris 05/10/2016   Apparently coronary intervention done in 1993 in [place] She was fine to have 75% narrowing of the mid LAD, nitroglycerin was given reduction to 30-40% it was also described as bridging in that area   CVA (cerebral vascular accident) (HCC)    2020   Daytime somnolence 09/15/2020   Elevated blood pressure 09/15/2021   Last Assessment & Plan:  Formatting of this note might be different from the  original. Blood pressure elevated in clinic today.  Patient will continue to follow-up with his primary care provider for further management.   Essential hypertension 04/10/2020   History of hepatitis 12/14/2019   Hypovitaminosis D 10/29/2021   Mixed hyperlipidemia 12/14/2019   Neck pain 09/15/2021   Last Assessment & Plan:  Formatting of this note might be different from the original. Atraumatic, acute onset cervical spine pain and stiffness x 2-3 weeks which has been progressive without improvement.  No associated fevers or chills.  Associated headaches, left shoulder, and left upper extremity pain that radiates down into her hand and fingers. This pain is most likely radicular in nature.      Obesity (BMI 30-39.9) 04/10/2020   Pain medication agreement 09/15/2021   Last Assessment & Plan:  Formatting of this note might be different from the original. Agreement signed and placed in the patient's chart today..  UDS to be collected in compliance with clinic policies and procedures.   Patient did not display any signs of abuse or diversion and has been checked on the [facility] Controlled Substance website.   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 Added automatically from request for surgery [id number] Formatting of this note might be different from the original. Added automatically from request for surgery [id number]   Past Surgical History:  Procedure Laterality Date   CESAREAN SECTION N/A    COLON SURGERY     TOTAL KNEE ARTHROPLASTY Bilateral    2017 and 2019   Family History   Problem Relation Age of Onset   Cancer Mother    Heart attack Mother    Heart attack Father    Cancer Father    Cancer Sister    Cancer Brother    Cancer Daughter    Social History   Socioeconomic History   Marital status: Widowed    Spouse name: Not on file   Number of children: 11   Years of education: 9   Highest education level: 9th grade  Occupational History   Occupation: Retired  Tobacco Use   Smoking status: Never    Passive exposure: Never   Smokeless tobacco: Never  Vaping Use   Vaping Use: Never used  Substance and Sexual Activity   Alcohol use: Never   Drug use: Never   Sexual activity: Not Currently  Other Topics Concern   Not on file  Social History Narrative   Not on file   Social Determinants of Health   Financial Resource Strain:  Medium Risk   Difficulty of Paying Living Expenses: Somewhat hard  Food Insecurity: No Food Insecurity   Worried About Running Out of Food in the Last Year: Never true   Ran Out of Food in the Last Year: Never true  Transportation Needs: No Transportation Needs   Lack of Transportation (Medical): No   Lack of Transportation (Non-Medical): No  Physical Activity: Inactive   Days of Exercise per Week: 0 days   Minutes of Exercise per Session: 0 min  Stress: No Stress Concern Present   Feeling of Stress : Only a little  Social Connections: Socially Isolated   Frequency of Communication with Friends and Family: Once a week   Frequency of Social Gatherings with Friends and Family: More than three times a week   Attends Religious Services: Never   Marine scientist or Organizations: No   Attends Archivist Meetings: Never   Marital Status: Widowed    Tobacco Counseling N/A   Diabetic? Prediabetes diagnosis. A1c 6.8 in 10/2021         Activities of Daily Living    02/18/2022    1:21 PM 07/30/2021    5:52 PM  In your present state of health, do you have any difficulty performing the following  activities:  Hearing? 0 0  Vision? 0 0  Difficulty concentrating or making decisions? 0 0  Walking or climbing stairs? 1 0  Comment has a bad back - has to be careful moving due to pain   Dressing or bathing? 0 0  Doing errands, shopping? 0 0  Preparing Food and eating ?  N  Using the Toilet?  N  In the past six months, have you accidently leaked urine?  N  Do you have problems with loss of bowel control?  N  Managing your Medications?  N  Managing your Finances?  N  Housekeeping or managing your Housekeeping?  Y    Patient Care Team: Lillard Anes, MD as PCP - General (Family Medicine) Berniece Salines, DO as PCP - Cardiology (Cardiology) Lane Hacker, [facility] as Pharmacist (Pharmacist) Thana Ates, RN as Case Manager Deirdre Peer, LCSW as Social Worker  Indicate any recent Medical Services you may have received from other than [facility] providers in the past year (date may be approximate).     Assessment:   This is a routine wellness examination for Tina Perkins.  Hearing/Vision screen No results found.  Dietary issues and exercise activities discussed:     Goals Addressed   None    Depression Screen    02/18/2022    1:20 PM 08/20/2021   10:18 AM 07/30/2021    5:46 PM 07/28/2021    8:37 AM 12/18/2019    3:58 PM 12/14/2019   11:54 AM 12/14/2019   10:01 AM  PHQ 2/9 Scores  PHQ - 2 Score 0 0 0 0 3 3 0  PHQ- 9 Score     11 11     Fall Risk    02/18/2022    1:23 PM 08/20/2021   10:17 AM 07/30/2021    5:51 PM 08/14/2020   10:13 AM 12/14/2019   11:54 AM  Fall Risk   Falls in the past year? 1 1 1 1  0  Number falls in past yr: 0 0 1 0 0  Injury with Fall? 0 1 1 0 0  Risk for fall due to : Impaired mobility;Impaired vision  History of fall(s);Impaired balance/gait;Impaired mobility Other (Comment)   Follow  up   Falls evaluation completed;Education provided;Falls prevention discussed Falls evaluation completed Falls evaluation completed    FALL RISK  PREVENTION PERTAINING TO THE HOME:  Any stairs in or around the home? Yes  If so, are there any without handrails? Yes  Home free of loose throw rugs in walkways, pet beds, electrical cords, etc? No  Adequate lighting in your home to reduce risk of falls? Yes   ASSISTIVE DEVICES UTILIZED TO PREVENT FALLS:  Life alert? Yes  Use of a cane, walker or w/c? Yes  Cane and walker Grab bars in the bathroom? Yes  Shower chair or bench in shower? No  Elevated toilet seat or a handicapped toilet? No   Cognitive Function:        02/18/2022    1:14 PM  6CIT Screen  What Year? 0 points  What month? 0 points  What time? 0 points  Count back from 20 0 points  Months in reverse 0 points  Repeat phrase 2 points  Total Score 2 points    Immunizations  There is no immunization history on file for this patient.  TDAP - due. Recommended. Advised pt to get at pharmacy  Flu Vaccine status: Declined, Education has been provided regarding the importance of this vaccine but patient still declined. Advised may receive this vaccine at local pharmacy or Health Dept. Aware to provide a copy of the vaccination record if obtained from local pharmacy or Health Dept. Verbalized acceptance and understanding.  Pna vaccine - pt reports is up to date, she had it when hospitalized for COVID in 2020 but I do not see this actually happened. Pt declines pna vaccine  Covid-19 vaccine status: Declined, Education has been provided regarding the importance of this vaccine but patient still declined. Advised may receive this vaccine at local pharmacy or Health Dept.or vaccine clinic. Aware to provide a copy of the vaccination record if obtained from local pharmacy or Health Dept. Verbalized acceptance and understanding.  Qualifies for Shingles Vaccine? Yes   Advised pt to get shingrix at local pharmacy  Screening Tests Health Maintenance  Topic Date Due   TETANUS/TDAP  Never done   Zoster Vaccines- Shingrix (1 of  2) Never done   DEXA SCAN  Never done   INFLUENZA VACCINE  01/02/2023 (Originally 05/04/2022)   Pneumonia Vaccine 65+ Years old (1 - PCV) 07/29/2023 (Originally 05/29/2006)   COVID-19 Vaccine (1) 08/14/2023 (Originally 11/29/1941)   HPV VACCINES  Aged Out    Health Maintenance  Health Maintenance Due  Topic Date Due   TETANUS/TDAP  Never done   Zoster Vaccines- Shingrix (1 of 2) Never done   DEXA SCAN  Never done    Colorectal cancer screening: No longer required.   Mammogram status: No longer required due to age.  DEXA - pt reports has had in the past  Lung Cancer Screening: (Low Dose CT Chest recommended if Age 19-80 years, 30 pack-year currently smoking OR have quit w/in 15years.) does not qualify.    Additional Screening:  Hepatitis C Screening: does qualify; declines  Vision Screening: Recommended annual ophthalmology exams for early detection of glaucoma and other disorders of the eye. Is the patient up to date with their annual eye exam?  No  If pt is not established with a provider, would they like to be referred to a provider to establish care? No .   Dental Screening: Recommended annual dental exams for proper oral hygiene. Hasn't been in 2 years.   [employer]  Referral / Chronic Care Management: CRR required this visit?  Yes   CCM required this visit?  No      Plan:     I have personally reviewed and noted the following in the patient's chart:   Medical and social history Use of alcohol, tobacco or illicit drugs  Current medications and supplements including opioid prescriptions.  Functional ability and status Nutritional status Physical activity Advanced directives List of other physicians Hospitalizations, surgeries, and ER visits in previous 12 months Vitals Screenings to include cognitive, depression, and falls Referrals and appointments  In addition, I have reviewed and discussed with patient certain preventive protocols, quality  metrics, and best practice recommendations. A written personalized care plan for preventive services as well as general preventive health recommendations were provided to patient.     Carvel Getting, NP   02/19/2022   Nurse Notes:  - Declined pneumonia vaccine - Instructed to get shingles vaccine and tetanus vaccine at local pharmacy.  - Pt reports she is short of breath frequently and that this is due to weight gain. She also does not exercise, she says due to weight gain. She would benefit from exercise intervention and eval of SOB.   I spent 35 minutes by telephone with pt

## 2022-02-19 NOTE — Patient Instructions (Signed)

## 2022-02-22 ENCOUNTER — Telehealth: Payer: Self-pay

## 2022-02-22 NOTE — Telephone Encounter (Signed)
   Telephone encounter was:  Successful.  02/22/2022 Name: Tina Perkins MRN: [id number] DOB: 11/01/1940  Tina Perkins is a 81 y.o. year old female who is a primary care patient of [name], MD . The community resource team was consulted for assistance with  Housing  Care Perkins performed the following interventions: Patient provided with information about care Perkins support team and interviewed to confirm resource needs. Patient advised she wants to move overall closer to her kids. At this time, pt decline exterminators and home modifications.   Follow Up Plan:  Care Perkins will outreach resources to assist patient with Housing  Tina Perkins, Embedded Care Coordination [facility]  [place], [place] [address]  Main Phone: [phone]  E-mail: [email or web address]  Website: [email or web address]

## 2022-02-25 ENCOUNTER — Ambulatory Visit: Payer: Self-pay | Admitting: *Deleted

## 2022-02-25 DIAGNOSIS — E782 Mixed hyperlipidemia: Secondary | ICD-10-CM

## 2022-02-25 DIAGNOSIS — Z599 Problem related to housing and economic circumstances, unspecified: Secondary | ICD-10-CM

## 2022-02-25 DIAGNOSIS — I2510 Atherosclerotic heart disease of native coronary artery without angina pectoris: Secondary | ICD-10-CM

## 2022-02-25 DIAGNOSIS — M48062 Spinal stenosis, lumbar region with neurogenic claudication: Secondary | ICD-10-CM

## 2022-02-25 DIAGNOSIS — I10 Essential (primary) hypertension: Secondary | ICD-10-CM

## 2022-02-25 NOTE — Progress Notes (Signed)
error 

## 2022-02-25 NOTE — Chronic Care Management (AMB) (Signed)
Chronic Care Management    Clinical Social Work Note  02/25/2022 Name: Tina Perkins MRN: [id number] DOB: 1941/06/15  Tina Perkins is a 81 y.o. year old female who is a primary care patient of [name], MD. The CCM team was consulted to assist the patient with chronic disease management and/or care coordination needs related to: [employer] .   Engaged with patient by telephone for follow up visit in response to provider referral for social work chronic care management and care coordination services.   Consent to Services:  The patient was given information about Chronic Care Management services, agreed to services, and gave verbal consent prior to initiation of services.  Please see initial visit note for detailed documentation.   Patient agreed to services and consent obtained.   Assessment: Review of patient past medical history, allergies, medications, and health status, including review of relevant consultants reports was performed today as part of a comprehensive evaluation and provision of chronic care management and care coordination services.     SDOH (Social Determinants of Health) assessments and interventions performed:    Advanced Directives Status: Not addressed in this encounter.  CCM Care Plan  Allergies  Allergen Reactions   Sulfa Antibiotics Hives    Outpatient Encounter Medications as of 02/25/2022  Medication Sig   acetaminophen (TYLENOL) 500 MG tablet Take 500 mg by mouth every 6 (six) hours as needed for mild pain or moderate pain. Takes  as needed for pain   amLODipine (NORVASC) 2.5 MG tablet Take 1 tablet (2.5 mg total) by mouth daily.   aspirin 325 MG tablet Take 325 mg by mouth every 6 (six) hours as needed for mild pain or moderate pain. Patient reports taking aspirin 2 tablets in the am, 2 tablets in the afternoon and 2 tablets at bedtime. (Patient not taking: Reported on 11/18/2021)   aspirin EC 81 MG tablet Take 81 mg by mouth daily.  Swallow whole.   gabapentin (NEURONTIN) 100 MG capsule Take 100 mg by mouth at bedtime.   meclizine (ANTIVERT) 12.5 MG tablet Take 1 tablet (12.5 mg total) by mouth 3 (three) times daily as needed for dizziness.   nitroGLYCERIN (NITROSTAT) 0.4 MG SL tablet Place 1 tablet (0.4 mg total) under the tongue every 5 (five) minutes as needed for chest pain.   pravastatin (PRAVACHOL) 40 MG tablet Take 1 tablet (40 mg total) by mouth daily.   triamcinolone cream (KENALOG) 0.1 % Apply 1 application topically 2 (two) times daily.   No facility-administered encounter medications on file as of 02/25/2022.    Patient Active Problem List   Diagnosis Date Noted   Class 2 obesity due to excess calories with body mass index (BMI) of 36.0 to 36.9 in adult 12/15/2021   Cervical spondylosis with radiculopathy 12/15/2021   DDD (degenerative disc disease), cervical 12/15/2021   Lumbar spondylolysis 12/15/2021   Hypovitaminosis D 10/29/2021   Chronic bilateral low back pain with bilateral sciatica 09/15/2021   Elevated blood pressure 09/15/2021   Neck pain 09/15/2021   Pain medication agreement 09/15/2021   PAT (paroxysmal atrial tachycardia) (HCC) 09/15/2020   Second degree AV block, Mobitz type I 09/15/2020   Essential hypertension 04/10/2020   Prediabetes 12/28/2019   Mixed hyperlipidemia 12/14/2019   Patent foramen ovale 12/14/2019   Sequelae of cerebral infarction 12/14/2019   History of hepatitis 12/14/2019   Spinal stenosis, lumbar region with neurogenic claudication 02/28/2019   Coronary artery disease involving native coronary artery of native heart without angina pectoris  05/10/2016       Care Plan : LCSW Plan of Care  Updates made by Buck Mam, LCSW since 02/25/2022 12:00 AM     Problem: Find Help in My Community.   Priority: High     Goal: Find Help in My Community. Completed 02/25/2022  Start Date: 07/30/2021  Expected End Date: 12/28/2021  This Visit's Progress: On track   Recent Progress: On track  Priority: High  Note:   Current Barriers:   Financial constraints related to low - Social Security Income, and only source of income.   Limited - social, family and caregiver support, and social isolation.   Transportation limited - inability to afford to replace transmission in vehicle, and inability to afford fees associated with public transportation, such as [employer] Occupational hygienist). Housing constraints - 1988 3-bedroom mobile home has holes in the floors in every room, except 2 (living room and half bathroom), mold and mildew throughout. Clinical Goals:  Patient will work with LCSW and Community Care Guides to address needs related to housing, financial and transportation insecurities.   Clinical Interventions:  Collaboration with Primary Care Physician, Dr. Brent Bulla regarding development and update of comprehensive plan of care as evidenced by provider attestation and co-signature. Inter-disciplinary care team collaboration (see longitudinal plan of care). Other Clinical Interventions: Pt is linked with our [employer] who is assisting pt.  Pt reports she continues to seek housing options. Pt denies any concerns or further need for CSW- will sign off and have reminded pt to ask PCP or RN to re-order our support if needs arise.  CSW spoke with pt on 11/05/21 who reports still seeking housing options- she has not applied for Section 8 yet- stressed the importance of following through asap. Pt and family are uncertain of her Medicaid status- type of Medicaid she may have. CSW will attempt to inquire with DSS for clarification. Solution-Focused Strategies implemented, Problem Solving/Task-Centered Skills utilized, Motivational Interviewing performed. Mailed the following list of housing resources to patient's home on 10/12/2021:  Permanent Subsidized Housing and Private Landlords Who Accept Section 8  Vouchers. Patient Goals/Self-Care Activities:  Work with LCSW on a monthly basis, in an effort to obtain permanent, safe, and affordable housing. Remain on waiting list for Affordable Housing and [employer], through the [employer] [phone]).   ~ Periodically call to check the status of your name on the waiting list.    Remain on waiting list for Section 8 Housing and [employer], through the [employer] [phone]).  ~ Periodically call to check the status of your name on the waiting list.  Begin contacting Permanent Subsidized Housing Resources [phone]), to inquire about availability. Begin contacting Private Landlords Who Accept Section 8 Vouchers [phone]), to inquire about availability. Continue to utilize [employer] [phone]), for transportation assistance to and from all [facility] affiliated facilities and/or provider practices. Contact LCSW directly if you have questions, need assistance, or if additional social work needs are identified. Follow-Up Date:  N/A      Follow Up Plan: N/A       Reece Levy MSW, LCSW Licensed Clinical Social Worker [facility]   [phone]

## 2022-03-02 ENCOUNTER — Ambulatory Visit: Payer: Medicare HMO | Admitting: Cardiology

## 2022-03-02 ENCOUNTER — Encounter: Payer: Self-pay | Admitting: Cardiology

## 2022-03-02 VITALS — BP 138/90 | HR 87 | Ht 59.0 in | Wt 180.4 lb

## 2022-03-02 DIAGNOSIS — I2510 Atherosclerotic heart disease of native coronary artery without angina pectoris: Secondary | ICD-10-CM

## 2022-03-02 DIAGNOSIS — I471 Supraventricular tachycardia: Secondary | ICD-10-CM

## 2022-03-02 DIAGNOSIS — E785 Hyperlipidemia, unspecified: Secondary | ICD-10-CM | POA: Diagnosis not present

## 2022-03-02 DIAGNOSIS — I10 Essential (primary) hypertension: Secondary | ICD-10-CM | POA: Diagnosis not present

## 2022-03-02 MED ORDER — AMLODIPINE BESYLATE 5 MG PO TABS: 5.0000 mg | ORAL_TABLET | Freq: Every day | ORAL | 0 refills | Status: DC

## 2022-03-02 MED ORDER — PRAVASTATIN SODIUM 80 MG PO TABS: 80.0000 mg | ORAL_TABLET | Freq: Every evening | ORAL | 0 refills | Status: DC

## 2022-03-02 NOTE — Progress Notes (Signed)
Cardiology Office Note:    Date:  03/02/2022   ID:  Tina Perkins, DOB 09/12/1941, MRN [id number]  PCP:  Lillard Anes, MD  Cardiologist:  Jenne Campus, MD    Referring MD: Lillard Anes,*   Chief Complaint  Patient presents with   Follow-up  Doing well but still have some chest pain  History of Present Illness:    Tina Perkins is a 81 y.o. female with past medical history significant for coronary artery disease, coronary CT angiogram few years ago showed only mild disease with calcium score being 12, paroxysmal atrial fibrillation, dyslipidemia. She comes today to my office for follow-up overall doing well.  Still active walking on the regular basis.  Still complain of having the same chest pain that she complained before about.  Located in the right upper portion of the chest she pinpoint to one location not related to exercise lasting between seconds to hours sometimes it can awaken her from sleep.  Taking deep breath or coughing does not make any difference  Past Medical History:  Diagnosis Date   Acute respiratory disease due to COVID-19 virus 08/12/2019   BMI 35.0-35.9,adult 03/31/2020   Chest pain, precordial 04/10/2020   Chronic bilateral low back pain with bilateral sciatica 09/15/2021   Last Assessment & Plan:  Formatting of this note might be different from the original. 81 year old female seen today in initial consultation for her chronic back pain with bilateral lower extremity radiculopathy, left greater than right.    Will request previous lumbar spine MRI for review.  At this time, I will start patient on gabapentin 100 mg q.h.s. x2 weeks then increase her to 200 mg q.h.s.    Coronary artery disease involving native coronary artery of native heart without angina pectoris 05/10/2016   Apparently coronary intervention done in 1993 in [place] She was fine to have 75% narrowing of the mid LAD, nitroglycerin was given reduction to 30-40% it was also  described as bridging in that area   CVA (cerebral vascular accident) ([place])    2020   Daytime somnolence 09/15/2020   Elevated blood pressure 09/15/2021   Last Assessment & Plan:  Formatting of this note might be different from the original. Blood pressure elevated in clinic today.  Patient will continue to follow-up with his primary care provider for further management.   Essential hypertension 04/10/2020   History of hepatitis 12/14/2019   Hypovitaminosis D 10/29/2021   Mixed hyperlipidemia 12/14/2019   Neck pain 09/15/2021   Last Assessment & Plan:  Formatting of this note might be different from the original. Atraumatic, acute onset cervical spine pain and stiffness x 2-3 weeks which has been progressive without improvement.  No associated fevers or chills.  Associated headaches, left shoulder, and left upper extremity pain that radiates down into her hand and fingers. This pain is most likely radicular in nature.      Obesity (BMI 30-39.9) 04/10/2020   Pain medication agreement 09/15/2021   Last Assessment & Plan:  Formatting of this note might be different from the original. Agreement signed and placed in the patient's chart today..  UDS to be collected in compliance with clinic policies and procedures.   Patient did not display any signs of abuse or diversion and has been checked on the [facility] Controlled Substance website.   Patent foramen ovale 12/14/2019   Pneumonia due to COVID-19 virus 08/12/2019   Pneumonia due to COVID-19 virus 08/12/2019   Prediabetes 12/28/2019   Pruritus 12/14/2019  Second degree AV block, Mobitz type I 09/15/2020   Sequelae of cerebral infarction 12/14/2019   Shortness of breath 04/10/2020   Snoring 09/15/2020   Spinal stenosis, lumbar region with neurogenic claudication 02/28/2019   Formatting of this note might be different from the original. Added automatically from request for surgery [phone] Formatting of this note might be different from the  original. Added automatically from request for surgery [id number]    Past Surgical History:  Procedure Laterality Date   CESAREAN SECTION N/A    COLON SURGERY     TOTAL KNEE ARTHROPLASTY Bilateral    2017 and 2019    Current Medications: Current Meds  Medication Sig   acetaminophen (TYLENOL) 500 MG tablet Take 500 mg by mouth every 6 (six) hours as needed for mild pain or moderate pain. Takes  as needed for pain   amLODipine (NORVASC) 2.5 MG tablet Take 1 tablet (2.5 mg total) by mouth daily.   aspirin 325 MG tablet Take 325 mg by mouth every 6 (six) hours as needed for mild pain or moderate pain. Patient reports taking aspirin 2 tablets in the am, 2 tablets in the afternoon and 2 tablets at bedtime.   aspirin EC 81 MG tablet Take 81 mg by mouth daily. Swallow whole.   gabapentin (NEURONTIN) 100 MG capsule Take 100 mg by mouth at bedtime.   meclizine (ANTIVERT) 12.5 MG tablet Take 1 tablet (12.5 mg total) by mouth 3 (three) times daily as needed for dizziness.   nitroGLYCERIN (NITROSTAT) 0.4 MG SL tablet Place 1 tablet (0.4 mg total) under the tongue every 5 (five) minutes as needed for chest pain.   pravastatin (PRAVACHOL) 40 MG tablet Take 1 tablet (40 mg total) by mouth daily.   triamcinolone cream (KENALOG) 0.1 % Apply 1 application topically 2 (two) times daily.     Allergies:   Sulfa antibiotics   Social History   Socioeconomic History   Marital status: Widowed    Spouse name: Not on file   Number of children: 11   Years of education: 9   Highest education level: 9th grade  Occupational History   Occupation: Retired  Tobacco Use   Smoking status: Never    Passive exposure: Never   Smokeless tobacco: Never  Vaping Use   Vaping Use: Never used  Substance and Sexual Activity   Alcohol use: Never   Drug use: Never   Sexual activity: Not Currently  Other Topics Concern   Not on file  Social History Narrative   Not on file   Social Determinants of Health   Financial  Resource Strain: Medium Risk   Difficulty of Paying Living Expenses: Somewhat hard  Food Insecurity: No Food Insecurity   Worried About Charity fundraiser in the Last Year: Never true   Ran Out of Food in the Last Year: Never true  Transportation Needs: No Transportation Needs   Lack of Transportation (Medical): No   Lack of Transportation (Non-Medical): No  Physical Activity: Inactive   Days of Exercise per Week: 0 days   Minutes of Exercise per Session: 0 min  Stress: No Stress Concern Present   Feeling of Stress : Only a little  Social Connections: Socially Isolated   Frequency of Communication with Friends and Family: Once a week   Frequency of Social Gatherings with Friends and Family: More than three times a week   Attends Religious Services: Never   Marine scientist or Organizations: No   Attends [employer]  or Organization Meetings: Never   Marital Status: Widowed     Family History: The patient's family history includes Cancer in her brother, daughter, father, mother, and sister; Heart attack in her father and mother. ROS:   Please see the history of present illness.    All 14 point review of systems negative except as described per history of present illness  EKGs/Labs/Other Studies Reviewed:      Recent Labs: 10/29/2021: ALT 26; BUN 14; Creatinine, Ser 0.77; Hemoglobin 14.7; Platelets 240; Potassium 4.4; Sodium 142; TSH 0.892  Recent Lipid Panel    Component Value Date/Time   CHOL 156 10/29/2021 0841   TRIG 171 (H) 10/29/2021 0841   HDL 41 10/29/2021 0841   CHOLHDL 3.8 10/29/2021 0841   LDLCALC 86 10/29/2021 0841    Physical Exam:    VS:  BP 138/90 (BP Location: Left Arm, Patient Position: Sitting)   Pulse 87   Ht 4\' 11"  (1.499 m)   Wt 180 lb 6.4 oz (81.8 kg)   SpO2 92%   BMI 36.44 kg/m     Wt Readings from Last 3 Encounters:  03/02/22 180 lb 6.4 oz (81.8 kg)  11/02/21 181 lb 6.4 oz (82.3 kg)  10/29/21 181 lb (82.1 kg)     GEN:  Well nourished,  well developed in no acute distress HEENT: Normal NECK: No JVD; No carotid bruits LYMPHATICS: No lymphadenopathy CARDIAC: RRR, no murmurs, no rubs, no gallops RESPIRATORY:  Clear to auscultation without rales, wheezing or rhonchi  ABDOMEN: Soft, non-tender, non-distended MUSCULOSKELETAL:  No edema; No deformity  SKIN: Warm and dry LOWER EXTREMITIES: no swelling NEUROLOGIC:  Alert and oriented x 3 PSYCHIATRIC:  Normal affect   ASSESSMENT:    1. Coronary artery disease involving native coronary artery of native heart without angina pectoris   2. Essential hypertension   3. PAT (paroxysmal atrial tachycardia) ([place])   4. Dyslipidemia    PLAN:    In order of problems listed above:  Coronary disease stable from that point review but still has some chest pain which is very atypical I asked her to try Maalox Mylanta with no relief.  More about potentially having vasospasm therefore I will increase dose of amlodipine from 2.5 to 5 mg daily. Dyslipidemia I did review her K PN which show me LDL of 86 HDL 41 with her symptomatology and known luckily only minimal coronary artery disease I think she can benefit from slightly higher dose of pravastatin we will increase pravastatin to 80 mg in 6 weeks from now we will recheck fasting lipid profile Paroxysmal atrial tachycardia, denies having any Essential hypertension blood pressure slightly higher side control she said she check her blood pressure at home sometimes she gets very high blood pressure we will increase dose of amlodipine which should help   Medication Adjustments/Labs and Tests Ordered: Current medicines are reviewed at length with the patient today.  Concerns regarding medicines are outlined above.  No orders of the defined types were placed in this encounter.  Medication changes: No orders of the defined types were placed in this encounter.   Signed, Park Liter, MD, [facility] 03/02/2022 9:58 AM    [place]

## 2022-03-02 NOTE — Patient Instructions (Addendum)
Medication Instructions:  Your physician has recommended you make the following change in your medication: Amlodipine 5mg  every day by mouth- you can double your current dose and then your next refill will be 5mg  tablets.                       Pravastatin 80mg  daily by mouth- you can double your current dose and your next refill will be 80mg  tablets   Lab Work: Your physician recommends that you return for lab work in: 6 weeks You need to have labs done when you are fasting.  You can come Monday through Friday 8:30 am to 12:00 pm and 1:15 to 4:30. You do not need to make an appointment as the order has already been placed. The labs you are going to have done are  Lipids, AST, ALT    Testing/Procedures: None Ordered   Follow-Up: At [facility], you and your health needs are our priority.  As part of our continuing mission to provide you with exceptional heart care, we have created designated Provider Care Teams.  These Care Teams include your primary Cardiologist (physician) and Advanced Practice Providers (APPs -  Physician Assistants and Nurse Practitioners) who all work together to provide you with the care you need, when you need it.  We recommend signing up for the patient portal called "MyChart".  Sign up information is provided on this After Visit Summary.  MyChart is used to connect with patients for Virtual Visits (Telemedicine).  Patients are able to view lab/test results, encounter notes, upcoming appointments, etc.  Non-urgent messages can be sent to your provider as well.   To learn more about what you can do with MyChart, go to [email or web address].    Your next appointment:   6 month(s)  The format for your next appointment:   In Person  Provider:   Jenne Campus, MD    Other Instructions NA

## 2022-03-02 NOTE — Addendum Note (Signed)
Addended by: Jacobo Forest D on: 03/02/2022 10:11 AM   Modules accepted: Orders

## 2022-03-03 DIAGNOSIS — E785 Hyperlipidemia, unspecified: Secondary | ICD-10-CM | POA: Diagnosis not present

## 2022-03-03 DIAGNOSIS — I10 Essential (primary) hypertension: Secondary | ICD-10-CM

## 2022-03-10 ENCOUNTER — Ambulatory Visit (INDEPENDENT_AMBULATORY_CARE_PROVIDER_SITE_OTHER): Payer: Medicare HMO | Admitting: Legal Medicine

## 2022-03-10 ENCOUNTER — Encounter: Payer: Self-pay | Admitting: Legal Medicine

## 2022-03-10 VITALS — BP 124/76 | HR 81 | Temp 97.5°F | Ht 59.0 in | Wt 181.0 lb

## 2022-03-10 DIAGNOSIS — I2510 Atherosclerotic heart disease of native coronary artery without angina pectoris: Secondary | ICD-10-CM

## 2022-03-10 DIAGNOSIS — E782 Mixed hyperlipidemia: Secondary | ICD-10-CM | POA: Diagnosis not present

## 2022-03-10 DIAGNOSIS — I471 Supraventricular tachycardia: Secondary | ICD-10-CM

## 2022-03-10 DIAGNOSIS — I6930 Unspecified sequelae of cerebral infarction: Secondary | ICD-10-CM | POA: Diagnosis not present

## 2022-03-10 DIAGNOSIS — Z6836 Body mass index (BMI) 36.0-36.9, adult: Secondary | ICD-10-CM

## 2022-03-10 DIAGNOSIS — G63 Polyneuropathy in diseases classified elsewhere: Secondary | ICD-10-CM | POA: Diagnosis not present

## 2022-03-10 DIAGNOSIS — R7303 Prediabetes: Secondary | ICD-10-CM | POA: Diagnosis not present

## 2022-03-10 DIAGNOSIS — E039 Hypothyroidism, unspecified: Secondary | ICD-10-CM

## 2022-03-10 DIAGNOSIS — G629 Polyneuropathy, unspecified: Secondary | ICD-10-CM | POA: Insufficient documentation

## 2022-03-10 DIAGNOSIS — R7989 Other specified abnormal findings of blood chemistry: Secondary | ICD-10-CM

## 2022-03-10 DIAGNOSIS — I10 Essential (primary) hypertension: Secondary | ICD-10-CM

## 2022-03-10 DIAGNOSIS — R053 Chronic cough: Secondary | ICD-10-CM | POA: Diagnosis not present

## 2022-03-10 MED ORDER — CARVEDILOL 12.5 MG PO TABS: 12.5000 mg | ORAL_TABLET | Freq: Two times a day (BID) | ORAL | 3 refills | Status: DC

## 2022-03-10 NOTE — Progress Notes (Signed)
Subjective:  Patient ID: Tina Perkins, female    DOB: July 07, 1941  Age: 81 y.o. MRN: [id number]  Chief Complaint  Patient presents with   Hyperlipidemia   Hypertension    HPI: chronic visit  Patient presents for follow up of hypertension.  Patient tolerating amlodipine well with side effects.BP stable  Patient was diagnosed with hypertension 2010 so has been treated for hypertension for 12 years.Patient is working on maintaining diet and exercise regimen and follows up as directed. Complication include cad.   Patient presents with hyperlipidemia.  Compliance with treatment has been good; patient takes medicines as directed, maintains low cholesterol diet, follows up as directed, and maintains exercise regimen.  Patient is using pravastatin without problems.   Patient has a cerebral infarction 2021 and has residual deficits ocular headaches . They are stable.  He is coping with this deficit by putting up with it. The patient is continuing treatment no.   Patient has PAT under conder control  Last 2Decho normal systolic function , grade one diastolic dysfunction.  She has minimal exercise tolerance, can walk 100 feet but must stop due to SOB, she has to breakup house cleaning due to SOB.  Worsening now. Gets chest pain and does not take nitrogycerine.    Current Outpatient Medications on File Prior to Visit  Medication Sig Dispense Refill   acetaminophen (TYLENOL) 500 MG tablet Take 500 mg by mouth every 6 (six) hours as needed for mild pain or moderate pain. Takes  as needed for pain     amLODipine (NORVASC) 5 MG tablet Take 1 tablet (5 mg total) by mouth daily. 90 tablet 0   aspirin 325 MG tablet Take 325 mg by mouth every 6 (six) hours as needed for mild pain or moderate pain. Patient reports taking aspirin 2 tablets in the am, 2 tablets in the afternoon and 2 tablets at bedtime.     aspirin EC 81 MG tablet Take 81 mg by mouth daily. Swallow whole.     gabapentin (NEURONTIN) 100 MG  capsule Take 100 mg by mouth at bedtime.     meclizine (ANTIVERT) 12.5 MG tablet Take 1 tablet (12.5 mg total) by mouth 3 (three) times daily as needed for dizziness. 30 tablet 2   nitroGLYCERIN (NITROSTAT) 0.4 MG SL tablet Place 1 tablet (0.4 mg total) under the tongue every 5 (five) minutes as needed for chest pain. 90 tablet 3   pravastatin (PRAVACHOL) 80 MG tablet Take 1 tablet (80 mg total) by mouth every evening. 90 tablet 0   triamcinolone cream (KENALOG) 0.1 % Apply 1 application topically 2 (two) times daily. 30 g 0   No current facility-administered medications on file prior to visit.   Past Medical History:  Diagnosis Date   Acute respiratory disease due to COVID-19 virus 08/12/2019   BMI 35.0-35.9,adult 03/31/2020   Chest pain, precordial 04/10/2020   Chronic bilateral low back pain with bilateral sciatica 09/15/2021   Last Assessment & Plan:  Formatting of this note might be different from the original. 81 year old female seen today in initial consultation for her chronic back pain with bilateral lower extremity radiculopathy, left greater than right.    Will request previous lumbar spine MRI for review.  At this time, I will start patient on gabapentin 100 mg q.h.s. x2 weeks then increase her to 200 mg q.h.s.    Coronary artery disease involving native coronary artery of native heart without angina pectoris 05/10/2016   Apparently coronary intervention done in 1993  in [place] She was fine to have 75% narrowing of the mid LAD, nitroglycerin was given reduction to 30-40% it was also described as bridging in that area   CVA (cerebral vascular accident) ([place])    2020   Daytime somnolence 09/15/2020   Elevated blood pressure 09/15/2021   Last Assessment & Plan:  Formatting of this note might be different from the original. Blood pressure elevated in clinic today.  Patient will continue to follow-up with his primary care provider for further management.   Essential hypertension  04/10/2020   History of hepatitis 12/14/2019   Hypovitaminosis D 10/29/2021   Mixed hyperlipidemia 12/14/2019   Neck pain 09/15/2021   Last Assessment & Plan:  Formatting of this note might be different from the original. Atraumatic, acute onset cervical spine pain and stiffness x 2-3 weeks which has been progressive without improvement.  No associated fevers or chills.  Associated headaches, left shoulder, and left upper extremity pain that radiates down into her hand and fingers. This pain is most likely radicular in nature.      Obesity (BMI 30-39.9) 04/10/2020   Pain medication agreement 09/15/2021   Last Assessment & Plan:  Formatting of this note might be different from the original. Agreement signed and placed in the patient's chart today..  UDS to be collected in compliance with clinic policies and procedures.   Patient did not display any signs of abuse or diversion and has been checked on the [facility] Controlled Substance website.   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 Added automatically from request for surgery [id number] Formatting of this note might be different from the original. Added automatically from request for surgery [id number]   Past Surgical History:  Procedure Laterality Date   CESAREAN SECTION N/A    COLON SURGERY     TOTAL KNEE ARTHROPLASTY Bilateral    2017 and 2019    Family History  Problem Relation Age of Onset   Cancer Mother    Heart attack Mother    Heart attack Father    Cancer Father    Cancer Sister    Cancer Brother    Cancer Daughter     Social History   Socioeconomic History   Marital status: Widowed    Spouse name: Not on file   Number of children: 11   Years of education: 9   Highest education level: 9th grade  Occupational History   Occupation: Retired  Tobacco Use   Smoking status: Never    Passive exposure: Never   Smokeless tobacco: Never  Vaping Use   Vaping Use: Never used  Substance and Sexual Activity   Alcohol use: Never   Drug use: Never   Sexual activity: Not Currently  Other Topics Concern   Not on file  Social History Narrative   Not on file   Social Determinants of Health   Financial Resource Strain: Medium Risk   Difficulty of Paying Living Expenses: Somewhat hard  Food Insecurity: No Food Insecurity   Worried About Running Out of Food in the Last Year: Never true   Ran  Out of Food in the Last Year: Never true  Transportation Needs: No Transportation Needs   Lack of Transportation (Medical): No   Lack of Transportation (Non-Medical): No  Physical Activity: Inactive   Days of Exercise per Week: 0 days   Minutes of Exercise per Session: 0 min  Stress: No Stress Concern Present   Feeling of Stress : Only a little  Social Connections: Socially Isolated   Frequency of Communication with Friends and Family: Once a week   Frequency of Social Gatherings with Friends and Family: More than three times a week   Attends Religious Services: Never   Marine scientist or Organizations: No   Attends Archivist Meetings: Never   Marital Status: Widowed    Review of Systems  Constitutional:  Negative for activity change and appetite change.  HENT:  Negative for congestion.   Eyes:  Negative for visual disturbance.  Respiratory:  Negative for chest tightness and shortness of breath.   Cardiovascular:  Positive for chest pain. Negative for palpitations.  Gastrointestinal:  Negative for abdominal distention and abdominal pain.  Endocrine: Negative for polyuria.  Genitourinary:   Negative for difficulty urinating and dyspareunia.  Psychiatric/Behavioral:  Negative for agitation and behavioral problems.     Objective:  BP 124/76   Pulse 81   Temp (!) 97.5 F (36.4 C)   Ht 4\' 11"  (1.499 m)   Wt 181 lb (82.1 kg)   SpO2 97%   BMI 36.56 kg/m      03/10/2022    7:36 AM 03/02/2022    9:36 AM 11/02/2021   10:41 AM  BP/Weight  Systolic BP A999333 [id number]  Diastolic BP 76 90 80  Wt. (Lbs) 181 180.4 181.4  BMI 36.56 kg/m2 36.44 kg/m2 36.64 kg/m2    Physical Exam Vitals reviewed. Nursing note reviewed: can walk 200 feet, stop due to DOE. Constitutional:      General: She is not in acute distress.    Appearance: Normal appearance.  HENT:     Head: Normocephalic and atraumatic.     Right Ear: Tympanic membrane normal.     Left Ear: Tympanic membrane normal.     Mouth/Throat:     Mouth: Mucous membranes are moist.     Pharynx: Oropharynx is clear.  Eyes:     Extraocular Movements: Extraocular movements intact.     Conjunctiva/sclera: Conjunctivae normal.     Pupils: Pupils are equal, round, and reactive to light.  Cardiovascular:     Rate and Rhythm: Normal rate and regular rhythm.     Pulses: Normal pulses.     Heart sounds: Normal heart sounds. No murmur heard.   No gallop.  Pulmonary:     Effort: Pulmonary effort is normal. No respiratory distress.     Breath sounds: Normal breath sounds. No wheezing.  Abdominal:     General: Abdomen is flat. Bowel sounds are normal. There is no distension.     Palpations: Abdomen is soft.     Tenderness: There is no abdominal tenderness.  Musculoskeletal:        General: Normal range of motion.     Cervical back: Normal range of motion and neck supple.     Right lower leg: No edema.     Left lower leg: No edema.     Comments: Pain in legs in AM, difficult to walk.  Skin:    General: Skin is warm.     Capillary Refill: Capillary refill takes 2 to  3 seconds.  Neurological:     General: No focal deficit present.      Mental Status: She is alert and oriented to person, place, and time. Mental status is at baseline.     Gait: Gait normal.     Deep Tendon Reflexes: Reflexes normal.  Psychiatric:        Mood and Affect: Mood normal.        Lab Results  Component Value Date   WBC 5.3 10/29/2021   HGB 14.7 10/29/2021   HCT 44.2 10/29/2021   PLT 240 10/29/2021   GLUCOSE 141 (H) 10/29/2021   CHOL 156 10/29/2021   TRIG 171 (H) 10/29/2021   HDL 41 10/29/2021   LDLCALC 86 10/29/2021   ALT 26 10/29/2021   AST 23 10/29/2021   NA 142 10/29/2021   K 4.4 10/29/2021   CL 107 (H) 10/29/2021   CREATININE 0.77 10/29/2021   BUN 14 10/29/2021   CO2 22 10/29/2021   TSH 0.892 10/29/2021   HGBA1C 6.8 (H) 10/29/2021      Assessment & Plan:   Problem List Items Addressed This Visit       Cardiovascular and Mediastinum   Coronary artery disease involving native coronary artery of native heart without angina pectoris   Relevant Medications   carvedilol (COREG) 12.5 MG tablet   Essential hypertension - Primary   Relevant Medications   carvedilol (COREG) 12.5 MG tablet   Other Relevant Orders   CBC with Differential/Platelet   Comprehensive metabolic panel   PAT (paroxysmal atrial tachycardia) (HCC)   Relevant Medications   carvedilol (COREG) 12.5 MG tablet     Nervous and Auditory   Peripheral neuropathy     Other   Mixed hyperlipidemia   Relevant Medications   carvedilol (COREG) 12.5 MG tablet   Other Relevant Orders   Lipid panel   Sequelae of cerebral infarction   Prediabetes   Relevant Orders   Hemoglobin A1c   BMI 36.0-36.9,adult   Other Visit Diagnoses     Chronic cough       Relevant Orders   DG Chest 2 View   Acquired hypothyroidism       Relevant Medications   carvedilol (COREG) 12.5 MG tablet   Other Relevant Orders   TSH   Low vitamin D level       Relevant Orders   VITAMIN D 25 Hydroxy (Vit-D Deficiency, Fractures)     .       Follow-up: Return in about 3  months (around 06/10/2022), or provider of choice.  An After Visit Summary was printed and given to the patient.  Reinaldo Meeker, MD [facility] [phone]

## 2022-03-11 ENCOUNTER — Telehealth: Payer: Self-pay

## 2022-03-11 ENCOUNTER — Other Ambulatory Visit: Payer: Self-pay | Admitting: Legal Medicine

## 2022-03-11 DIAGNOSIS — E559 Vitamin D deficiency, unspecified: Secondary | ICD-10-CM

## 2022-03-11 LAB — COMPREHENSIVE METABOLIC PANEL
ALT: 21 IU/L (ref 0–32)
AST: 22 IU/L (ref 0–40)
Albumin/Globulin Ratio: 1.9 (ref 1.2–2.2)
Albumin: 4.1 g/dL (ref 3.7–4.7)
Alkaline Phosphatase: 78 IU/L (ref 44–121)
BUN/Creatinine Ratio: 20 (ref 12–28)
BUN: 14 mg/dL (ref 8–27)
Bilirubin Total: 0.5 mg/dL (ref 0.0–1.2)
CO2: 17 mmol/L — ABNORMAL LOW (ref 20–29)
Calcium: 9.2 mg/dL (ref 8.7–10.3)
Chloride: 107 mmol/L — ABNORMAL HIGH (ref 96–106)
Creatinine, Ser: 0.7 mg/dL (ref 0.57–1.00)
Globulin, Total: 2.2 g/dL (ref 1.5–4.5)
Glucose: 168 mg/dL — ABNORMAL HIGH (ref 70–99)
Potassium: 4.3 mmol/L (ref 3.5–5.2)
Sodium: 140 mmol/L (ref 134–144)
Total Protein: 6.3 g/dL (ref 6.0–8.5)
eGFR: 87 mL/min/{1.73_m2} (ref >59)

## 2022-03-11 LAB — CBC WITH DIFFERENTIAL/PLATELET
Basophils Absolute: 0.1 10*3/uL (ref 0.0–0.2)
Basos: 1 %
EOS (ABSOLUTE): 0.2 10*3/uL (ref 0.0–0.4)
Eos: 3 %
Hematocrit: 42.6 % (ref 34.0–46.6)
Hemoglobin: 14.2 g/dL (ref 11.1–15.9)
Immature Grans (Abs): 0 10*3/uL (ref 0.0–0.1)
Immature Granulocytes: 0 %
Lymphocytes Absolute: 1.7 10*3/uL (ref 0.7–3.1)
Lymphs: 36 %
MCH: 29.4 pg (ref 26.6–33.0)
MCHC: 33.3 g/dL (ref 31.5–35.7)
MCV: 88 fL (ref 79–97)
Monocytes Absolute: 0.4 10*3/uL (ref 0.1–0.9)
Monocytes: 9 %
Neutrophils Absolute: 2.3 10*3/uL (ref 1.4–7.0)
Neutrophils: 51 %
Platelets: 194 10*3/uL (ref 150–450)
RBC: 4.83 x10E6/uL (ref 3.77–5.28)
RDW: 11.7 % (ref 11.7–15.4)
WBC: 4.7 10*3/uL (ref 3.4–10.8)

## 2022-03-11 LAB — LIPID PANEL
Chol/HDL Ratio: 4.3 ratio (ref 0.0–4.4)
Cholesterol, Total: 169 mg/dL (ref 100–199)
HDL: 39 mg/dL — ABNORMAL LOW (ref >39)
LDL Chol Calc (NIH): 97 mg/dL (ref 0–99)
Triglycerides: 191 mg/dL — ABNORMAL HIGH (ref 0–149)
VLDL Cholesterol Cal: 33 mg/dL (ref 5–40)

## 2022-03-11 LAB — TSH: TSH: 0.548 u[IU]/mL (ref 0.450–4.500)

## 2022-03-11 LAB — HEMOGLOBIN A1C
Est. average glucose Bld gHb Est-mCnc: 146 mg/dL
Hgb A1c MFr Bld: 6.7 % — ABNORMAL HIGH (ref 4.8–5.6)

## 2022-03-11 LAB — CARDIOVASCULAR RISK ASSESSMENT

## 2022-03-11 LAB — VITAMIN D 25 HYDROXY (VIT D DEFICIENCY, FRACTURES): Vit D, 25-Hydroxy: 18.1 ng/mL — ABNORMAL LOW (ref 30.0–100.0)

## 2022-03-11 MED ORDER — VITAMIN D (ERGOCALCIFEROL) 1.25 MG (50000 UNIT) PO CAPS: 50000.0000 [IU] | ORAL_CAPSULE | ORAL | 2 refills | Status: DC

## 2022-03-11 NOTE — Progress Notes (Signed)
    Chronic Care Management Pharmacy Assistant   Name: Tina Perkins  MRN: [id number] DOB: 1941-04-14   Reason for Encounter: Medication Coordination for Upstream    Recent office visits:  03/10/22 Brent Bulla MD. Seen for routine visit. Started on Carvedilol 12.5mg  two times daily.   Recent consult visits:  03/02/22 (Cardiology) Gypsy Balsam MD. Seen for CAD. Increased Amlodipine to 5mg  daily and Pravastatin to 80mg .   Hospital visits:  None  Medications: Outpatient Encounter Medications as of 03/11/2022  Medication Sig   acetaminophen (TYLENOL) 500 MG tablet Take 500 mg by mouth every 6 (six) hours as needed for mild pain or moderate pain. Takes  as needed for pain   amLODipine (NORVASC) 5 MG tablet Take 1 tablet (5 mg total) by mouth daily.   aspirin 325 MG tablet Take 325 mg by mouth every 6 (six) hours as needed for mild pain or moderate pain. Patient reports taking aspirin 2 tablets in the am, 2 tablets in the afternoon and 2 tablets at bedtime.   aspirin EC 81 MG tablet Take 81 mg by mouth daily. Swallow whole.   carvedilol (COREG) 12.5 MG tablet Take 1 tablet (12.5 mg total) by mouth 2 (two) times daily with a meal.   gabapentin (NEURONTIN) 100 MG capsule Take 100 mg by mouth at bedtime.   meclizine (ANTIVERT) 12.5 MG tablet Take 1 tablet (12.5 mg total) by mouth 3 (three) times daily as needed for dizziness.   nitroGLYCERIN (NITROSTAT) 0.4 MG SL tablet Place 1 tablet (0.4 mg total) under the tongue every 5 (five) minutes as needed for chest pain.   pravastatin (PRAVACHOL) 80 MG tablet Take 1 tablet (80 mg total) by mouth every evening.   triamcinolone cream (KENALOG) 0.1 % Apply 1 application topically 2 (two) times daily.   Vitamin D, Ergocalciferol, (DRISDOL) 1.25 MG (50000 UNIT) CAPS capsule Take 1 capsule (50,000 Units total) by mouth every 7 (seven) days.   No facility-administered encounter medications on file as of 03/11/2022.    Reviewed chart for medication  changes ahead of medication coordination call.  No hospital visits since last care coordination call/Pharmacist visit.   BP Readings from Last 3 Encounters:  03/10/22 124/76  03/02/22 138/90  11/02/21 136/80    Lab Results  Component Value Date   HGBA1C 6.7 (H) 03/10/2022     Patient obtains medications through Vials  30 Days   Last adherence delivery included:  Pravastatin 40 mg  daily  Amlodipine 2.5 mg daily  Aspirin 81mg  1 daily   Patient declined (meds) last month  Gabapentin 100mg - Gets through 11/04/21. Filled on 01/25/22 30ds Meclizine 12.5mg  - Takes 3 times daily prn- Does not need, still has enough supply on hand  Patient is due for next adherence delivery on: 03/23/22. Called patient and reviewed medications and coordinated delivery.  This delivery to include: None  Patient declined the following medications  Gabapentin 100mg - Gets through . Filled on 02/26/22 30ds Meclizine 12.5mg  - Takes 3 times daily prn- Does not need, still has enough supply on hand Carvedilol 12.5mg  was sent to [facility] and picked up on 03/10/22 90ds. Will ask pharmacy to transfer  Pravastatin 80 mg  daily -Picked up at [facility] on 03/04/22 90ds. Requesting a transfer Amlodipine 5 mg daily -Picked up at [facility] on 03/02/22 90ds. Requesting a transfer Aspirin 81mg  1 daily -Pt has enough on hand  Patient needs refills  None  05/10/22, [facility] Clinical Pharmacist Assistant  [phone]

## 2022-03-11 NOTE — Progress Notes (Signed)
Glucose 168, kidney tests normal, liver tests normal, A1c 6.7, Triglycerides 191 high, Vitamin D low at 18.1, vitamin D called in, CBC normal, TSH o.548 good lp

## 2022-03-12 ENCOUNTER — Telehealth: Payer: Self-pay

## 2022-03-12 NOTE — Telephone Encounter (Signed)
   Telephone encounter was:  Unsuccessful. Mail encounter was:  Successful.   03/12/2022 Name: [name] MRN: [id number] DOB: 10/24/1940  Tina Perkins is a 81 y.o. year old female who is a primary care patient of [name], MD . The community resource team was consulted for assistance with  Housing  Care guide performed the following interventions: Follow up call placed to the patient to discuss status of referral. CG will be sending housing resources for [facility] per request as pt is overall wanting to move there.   Follow Up Plan:  Care guide will follow up with patient by phone over the next few days to confirm that mail has been received.  [facility] Guide, Embedded Care Coordination [facility]  [place], [place] [address]  Main Phone: [phone]  E-mail: [email or web address]  Website: [email or web address]

## 2022-03-17 NOTE — Telephone Encounter (Signed)
Compliant on meds, f/u 90 days

## 2022-03-24 ENCOUNTER — Telehealth: Payer: Self-pay

## 2022-03-24 NOTE — Telephone Encounter (Signed)
   Telephone encounter was:  Successful.  03/24/2022 Name: [name] MRN: [id number] DOB: April 01, 1941  Tina Perkins is a 81 y.o. year old female who is a primary care patient of [name], MD . The community resource team was consulted for assistance with  Housing  Care guide performed the following interventions: Follow up call placed to the patient to discuss status of referral. Patient advised she received resources in the mail and that at this time she does not have any further questions or concerns as the information from the packet was clear.  Follow Up Plan:  No further follow up planned at this time. The patient has been provided with needed resources. Patient has been informed on how to contact me when needed.  [facility] Guide, Embedded Care Coordination [facility]  [place], [place] [address]  Main Phone: [phone]  E-mail: [email or web address]  Website: [email or web address]

## 2022-04-09 ENCOUNTER — Other Ambulatory Visit: Payer: Self-pay

## 2022-04-09 ENCOUNTER — Telehealth: Payer: Self-pay

## 2022-04-09 DIAGNOSIS — R42 Dizziness and giddiness: Secondary | ICD-10-CM

## 2022-04-09 MED ORDER — MECLIZINE HCL 12.5 MG PO TABS: 12.5000 mg | ORAL_TABLET | Freq: Three times a day (TID) | ORAL | 2 refills | Status: DC | PRN

## 2022-04-09 NOTE — Progress Notes (Signed)
Chronic Care Management Pharmacy Assistant   Name: Shavonna Corella  MRN: [id number] DOB: Nov 12, 1940   Reason for Encounter: Medication Coordination for [facility]stream    Recent office visits:  None  Recent consult visits:  None  Hospital visits:  None  Medications: Outpatient Encounter Medications as of 04/09/2022  Medication Sig   acetaminophen (TYLENOL) 500 MG tablet Take 500 mg by mouth every 6 (six) hours as needed for mild pain or moderate pain. Takes  as needed for pain   amLODipine (NORVASC) 5 MG tablet Take 1 tablet (5 mg total) by mouth daily.   aspirin 325 MG tablet Take 325 mg by mouth every 6 (six) hours as needed for mild pain or moderate pain. Patient reports taking aspirin 2 tablets in the am, 2 tablets in the afternoon and 2 tablets at bedtime.   aspirin EC 81 MG tablet Take 81 mg by mouth daily. Swallow whole.   carvedilol (COREG) 12.5 MG tablet Take 1 tablet (12.5 mg total) by mouth 2 (two) times daily with a meal.   gabapentin (NEURONTIN) 100 MG capsule Take 100 mg by mouth at bedtime.   meclizine (ANTIVERT) 12.5 MG tablet Take 1 tablet (12.5 mg total) by mouth 3 (three) times daily as needed for dizziness.   nitroGLYCERIN (NITROSTAT) 0.4 MG SL tablet Place 1 tablet (0.4 mg total) under the tongue every 5 (five) minutes as needed for chest pain.   pravastatin (PRAVACHOL) 80 MG tablet Take 1 tablet (80 mg total) by mouth every evening.   triamcinolone cream (KENALOG) 0.1 % Apply 1 application topically 2 (two) times daily.   Vitamin D, Ergocalciferol, (DRISDOL) 1.25 MG (50000 UNIT) CAPS capsule Take 1 capsule (50,000 Units total) by mouth every 7 (seven) days.   No facility-administered encounter medications on file as of 04/09/2022.    Reviewed chart for medication changes ahead of medication coordination call.  No OVs, Consults, or hospital visits since last care coordination call/Pharmacist visit.   No medication changes indicated OR if recent visit, treatment plan  here.  BP Readings from Last 3 Encounters:  03/10/22 124/76  03/02/22 138/90  11/02/21 136/80    Lab Results  Component Value Date   HGBA1C 6.7 (H) 03/10/2022     Patient obtains medications through Vials  30 Days   Last adherence delivery included:  None  Patient declined (meds) last month  Gabapentin 100mg - Gets through . Filled on 02/26/22 30ds Meclizine 12.5mg  - Takes 3 times daily prn- Does not need, still has enough supply on hand Carvedilol 12.5mg  was sent to [facility] and picked up on 03/10/22 90ds. Will ask pharmacy to transfer  Pravastatin 80 mg  daily -Picked up at [facility] on 03/04/22 90ds. Requesting a transfer Amlodipine 5 mg daily -Picked up at [facility] on 03/02/22 90ds. Requesting a transfer Aspirin 81mg  1 daily -Pt has enough on hand  Patient is due for next adherence delivery on: 04/21/22. Called patient and reviewed medications and coordinated delivery.  This delivery to include: Meclizine 12.5mg -Take 1 tablet 3 times daily prn   Patient declined the following medications  Gabapentin 100mg - Gets through . Filled on 02/26/22 30ds Meclizine 12.5mg  - Takes 3 times daily prn- Does not need, still has enough supply on hand Carvedilol 12.5mg  was sent to [facility] and picked up on 03/10/22 90ds. Will ask pharmacy to transfer  Pravastatin 80 mg  daily -Picked up at [facility] on 03/04/22 90ds. Requesting a transfer Amlodipine 5 mg daily -Picked up at [facility] on 03/02/22 90ds.  Requesting a transfer Aspirin 81mg  1 daily -Pt has enough on hand  Patient needs refills Meclizine 12.5mg -Sent request for script   Confirmed delivery date of 04/21/22, advised patient that pharmacy will contact them the morning of delivery.   04/23/22, CMA Clinical Pharmacist Assistant  [phone]

## 2022-04-09 NOTE — Telephone Encounter (Signed)
Will sync and transfer scripts for [facility]

## 2022-04-20 ENCOUNTER — Ambulatory Visit (INDEPENDENT_AMBULATORY_CARE_PROVIDER_SITE_OTHER): Payer: Medicare HMO

## 2022-04-20 DIAGNOSIS — R7303 Prediabetes: Secondary | ICD-10-CM

## 2022-04-20 DIAGNOSIS — E782 Mixed hyperlipidemia: Secondary | ICD-10-CM

## 2022-04-20 DIAGNOSIS — I10 Essential (primary) hypertension: Secondary | ICD-10-CM

## 2022-04-20 DIAGNOSIS — M48062 Spinal stenosis, lumbar region with neurogenic claudication: Secondary | ICD-10-CM

## 2022-04-20 NOTE — Chronic Care Management (AMB) (Signed)
Chronic Care Management   CCM RN Visit Note  04/20/2022 Name: Tina Perkins MRN: [id number] DOB: [birth year]  Subjective: Tina Perkins is a 81 y.o. year old female who is a primary care patient of Lillard Anes, MD. The care management team was consulted for assistance with disease management and care coordination needs.    Engaged with patient by telephone for follow up visit in response to provider referral for case management and/or care coordination services.   Consent to Services:  The patient was given information about Chronic Care Management services, agreed to services, and gave verbal consent prior to initiation of services.  Please see initial visit note for detailed documentation.   Patient agreed to services and verbal consent obtained.   Assessment: Review of patient past medical history, allergies, medications, health status, including review of consultants reports, laboratory and other test data, was performed as part of comprehensive evaluation and provision of chronic care management services.   SDOH (Social Determinants of Health) assessments and interventions performed:    CCM Care Plan  Allergies  Allergen Reactions   Sulfa Antibiotics Hives    Outpatient Encounter Medications as of 04/20/2022  Medication Sig   acetaminophen (TYLENOL) 500 MG tablet Take 500 mg by mouth every 6 (six) hours as needed for mild pain or moderate pain. Takes  as needed for pain   amLODipine (NORVASC) 5 MG tablet Take 1 tablet (5 mg total) by mouth daily.   aspirin EC 81 MG tablet Take 81 mg by mouth daily. Swallow whole.   carvedilol (COREG) 12.5 MG tablet Take 1 tablet (12.5 mg total) by mouth 2 (two) times daily with a meal.   gabapentin (NEURONTIN) 100 MG capsule Take 100 mg by mouth at bedtime.   meclizine (ANTIVERT) 12.5 MG tablet Take 1 tablet (12.5 mg total) by mouth 3 (three) times daily as needed for dizziness.   pravastatin (PRAVACHOL) 80 MG tablet Take 1 tablet (80  mg total) by mouth every evening.   triamcinolone cream (KENALOG) 0.1 % Apply 1 application topically 2 (two) times daily.   Vitamin D, Ergocalciferol, (DRISDOL) 1.25 MG (50000 UNIT) CAPS capsule Take 1 capsule (50,000 Units total) by mouth every 7 (seven) days.   aspirin 325 MG tablet Take 325 mg by mouth every 6 (six) hours as needed for mild pain or moderate pain. Patient reports taking aspirin 2 tablets in the am, 2 tablets in the afternoon and 2 tablets at bedtime.   nitroGLYCERIN (NITROSTAT) 0.4 MG SL tablet Place 1 tablet (0.4 mg total) under the tongue every 5 (five) minutes as needed for chest pain.   No facility-administered encounter medications on file as of 04/20/2022.    Patient Active Problem List   Diagnosis Date Noted   Peripheral neuropathy 03/10/2022   BMI 36.0-36.9,adult 03/10/2022   Class 2 obesity due to excess calories with body mass index (BMI) of 36.0 to 36.9 in adult 12/15/2021   Cervical spondylosis with radiculopathy 12/15/2021   DDD (degenerative disc disease), cervical 12/15/2021   Lumbar spondylolysis 12/15/2021   Hypovitaminosis D 10/29/2021   Chronic bilateral low back pain with bilateral sciatica 09/15/2021   Neck pain 09/15/2021   Pain medication agreement 09/15/2021   PAT (paroxysmal atrial tachycardia) ([facility]) 09/15/2020   Second degree AV block, Mobitz type I 09/15/2020   Essential hypertension 04/10/2020   Prediabetes 12/28/2019   Mixed hyperlipidemia 12/14/2019   Patent foramen ovale 12/14/2019   Sequelae of cerebral infarction 12/14/2019   History of hepatitis 12/14/2019  Spinal stenosis, lumbar region with neurogenic claudication 02/28/2019   Coronary artery disease involving native coronary artery of native heart without angina pectoris 05/10/2016    Conditions to be addressed/monitored:HTN, HLD, and chronic pain  Care Plan : RN Care manager plan of care  Updates made by Thana Ates, RN since 04/20/2022 12:00 AM     Problem: No plan of  care established for chronic disease states (Hypertension, chronic pain)   Priority: High  Onset Date: 09/17/2021     Long-Range Goal: Development of plan of care for chonic disease management ( hypertension, HLD and chronic pain)   Start Date: 09/17/2021  Expected End Date: 09/17/2022  Priority: High  Note:   Current Barriers:  Chronic Disease Management support and education needs related to HTN, HLD, and no advanced directives, chronic pain    09/17/2021  Patient reports that she is self monitoring her blood pressure. Reports range of 136-140/80.  Reports she is taking her medications as prescribed. Reports she saw her pain doctor yesterday and is have x rays. Has follow up planned for 1 month. Reports she received her advanced directive packet in the mail but has not completed it.  02/15/21   Patient reports having follow up with pain management provider on 12/15/21.  She states she agreed to have cervical spine epidural injections and is currently waiting for an appointment with the [facility].  Patient states her doctor restarted her on gabapentin and tramadol.  She reports her pain level today is an 8/10.   Patient states her blood pressure continues to be up and down.   Reports most recent readings ranging from 111/65 to 180/82.  She states she continues to take her medications as prescribed.   Patient states vaginal odor she reported to [facility] has resolved.  Per chart review patient has 3 month follow up appointment with primary care provider on 03/10/22.  Patient confirmed appointment.  04/20/2022 Spoke with patient who reports that she is doing well. Reports she continues to have pain. 6/10 today. Reports she had to cancel her appointment with the surgery center for her epidural injections and she nor her daughter called back to reschedule.  States that she is taking her tylenol and gabapentin.  States BP good with last reading of 120/84.  Denies any new problems or concerns.  Patient reports that she is self managing well.  RNCM Clinical Goal(s):  Patient will take all medications exactly as prescribed and will call provider for medication related questions as evidenced by patient report of taking his medications correctly attend all scheduled medical appointments: PCP as evidenced by review of office notes continue to work with RN Care Manager to address care management and care coordination needs related to  HTN and chronic pain as evidenced by adherence to CM Team Scheduled appointments through collaboration with RN Care manager, provider, and care team.   Interventions: 1:1 collaboration with primary care provider regarding development and update of comprehensive plan of care as evidenced by provider attestation and co-signature Inter-disciplinary care team collaboration (see longitudinal plan of care) Evaluation of current treatment plan related to  self management and patient's adherence to plan as established by provider   Hyperlipidemia Interventions:  (Status:  Goal Met.) Long Term Goal Medication review performed; medication list updated in electronic medical record.  Counseled on importance of regular laboratory monitoring as prescribed Encouraged patient to take her medications as prescribed.   Reviewed upcoming/ scheduled appointments.  Reviewed recent labs  Hypertension Interventions:  (Status:  Goal Met.) Long Term Goal Last practice recorded BP readings:  BP Readings from Last 3 Encounters:  03/10/22 124/76  03/02/22 138/90  11/02/21 136/80  Most recent eGFR/CrCl:  Lab Results  Component Value Date   EGFR 87 03/10/2022    No components found for: "CRCL"  Evaluation of current treatment plan related to hypertension self management and patient's adherence to plan as established by provider Reviewed medications with patient and discussed importance of compliance Discussed plans with patient for ongoing care management follow up and  provided patient with direct contact information for care management team Advised patient, providing education and rationale, to monitor blood pressure daily and record, calling PCP for findings outside established parameters Reviewed scheduled/upcoming provider appointments including:  Discussed complications of poorly controlled blood pressure such as heart disease, stroke, circulatory complications, vision complications, kidney impairment, sexual dysfunction Advised to follow a low salt diet.   Pain Interventions:  (Status:  Patient declined further engagement on this goal.) Long Term Goal Pain assessment performed Medications reviewed and discussed importance of compliance.  Reviewed provider established plan for pain management Discussed importance of adherence to all scheduled medical appointments Counseled on the importance of reporting any/all new or changed pain symptoms or management strategies to pain management provider Discussed upcoming / scheduled appointments.  Reviewed with patient to call and reschedule her missed about for her cervical epidural if she continues to have pain.  Today's Vitals   04/20/22 1310  PainSc: 6      Vaginal Odor  (Status:   Resolved )  Short Term Goal  Evaluation of current treatment plan related to  vaginal odor,  self-management and patient's adherence to plan as established by provider. Discussed plans with patient for ongoing care management follow up and provided patient with direct contact information for care management team Advised patient to call MD office and make an appointment.  Assessed for urinary symptoms and vaginal discharge.    Patient Goals/Self-Care Activities: Take all medications as prescribed Attend all scheduled provider appointments Call pharmacy for medication refills 3-7 days in advance of running out of medications Call provider office for new concerns or questions  Work with the social worker to address care  coordination needs and will continue to work with the clinical team to address health care and disease management related needs check blood pressure daily write blood pressure results in a log or diary take blood pressure log to all doctor appointments call doctor for signs and symptoms of high blood pressure take medications for blood pressure exactly as prescribed report new symptoms to your doctor       Plan:No further follow up required: Patient will follow up with MD as planned Tomasa Rand RN, BSN, CEN RN Case Manager - [facility] [employer] Mobile: [phone]

## 2022-04-20 NOTE — Patient Instructions (Signed)
Visit Information  Thank you for taking time to visit with me today. Please don't hesitate to contact me if I can be of assistance to you before our next scheduled telephone appointment.  Following are the goals we discussed today:  Follow up with MD as planned.  If you are experiencing a Mental Health or Behavioral Health Crisis or need someone to talk to, please call the Suicide and Crisis Lifeline: 988 call the [place] National Suicide Prevention Lifeline: 1-800-273-8255 or TTY: 1-800-799-4 TTY (1-800-799-4889) to talk to a trained counselor call 1-800-273-TALK (toll free, 24 hour hotline) call 911   The patient verbalized understanding of instructions, educational materials, and care plan provided today and DECLINED offer to receive copy of patient instructions, educational materials, and care plan.   Konnor Jorden RN, BSN, CEN RN Case Manager - [facility] Mobile: [phone]  

## 2022-05-03 DIAGNOSIS — I10 Essential (primary) hypertension: Secondary | ICD-10-CM | POA: Diagnosis not present

## 2022-05-03 DIAGNOSIS — E782 Mixed hyperlipidemia: Secondary | ICD-10-CM

## 2022-05-14 ENCOUNTER — Telehealth: Payer: Self-pay

## 2022-05-14 NOTE — Telephone Encounter (Signed)
Compliant on meds 

## 2022-05-14 NOTE — Progress Notes (Signed)
    Chronic Care Management Pharmacy Assistant   Name: Geraldina Parrott  MRN: [id number] DOB: 03/06/1941   Reason for Encounter: Medication Coordination for [facility]    Recent office visits:  None  Recent consult visits:  None  Hospital visits:  None  Medications: Outpatient Encounter Medications as of 05/14/2022  Medication Sig   acetaminophen (TYLENOL) 500 MG tablet Take 500 mg by mouth every 6 (six) hours as needed for mild pain or moderate pain. Takes  as needed for pain   amLODipine (NORVASC) 5 MG tablet Take 1 tablet (5 mg total) by mouth daily.   aspirin 325 MG tablet Take 325 mg by mouth every 6 (six) hours as needed for mild pain or moderate pain. Patient reports taking aspirin 2 tablets in the am, 2 tablets in the afternoon and 2 tablets at bedtime.   aspirin EC 81 MG tablet Take 81 mg by mouth daily. Swallow whole.   carvedilol (COREG) 12.5 MG tablet Take 1 tablet (12.5 mg total) by mouth 2 (two) times daily with a meal.   gabapentin (NEURONTIN) 100 MG capsule Take 100 mg by mouth at bedtime.   meclizine (ANTIVERT) 12.5 MG tablet Take 1 tablet (12.5 mg total) by mouth 3 (three) times daily as needed for dizziness.   nitroGLYCERIN (NITROSTAT) 0.4 MG SL tablet Place 1 tablet (0.4 mg total) under the tongue every 5 (five) minutes as needed for chest pain.   pravastatin (PRAVACHOL) 80 MG tablet Take 1 tablet (80 mg total) by mouth every evening.   triamcinolone cream (KENALOG) 0.1 % Apply 1 application topically 2 (two) times daily.   Vitamin D, Ergocalciferol, (DRISDOL) 1.25 MG (50000 UNIT) CAPS capsule Take 1 capsule (50,000 Units total) by mouth every 7 (seven) days.   No facility-administered encounter medications on file as of 05/14/2022.    Reviewed chart for medication changes ahead of medication coordination call.  No OVs, Consults, or hospital visits since last care coordination call/Pharmacist visit.   No medication changes indicated OR if recent visit, treatment  plan here.  BP Readings from Last 3 Encounters:  03/10/22 124/76  03/02/22 138/90  11/02/21 136/80    Lab Results  Component Value Date   HGBA1C 6.7 (H) 03/10/2022     Patient obtains medications through Vials  90 Days   Last adherence delivery included:  Meclizine 12.5mg -Take 1 tablet 3 times daily prn   Patient declined (meds) last month  Gabapentin 100mg - Gets through . Filled on 02/26/22 30ds Carvedilol 12.5mg  was sent to [facility] and picked up on 03/10/22 90ds. Will ask pharmacy to transfer  Pravastatin 80 mg  daily -Picked up at [facility] on 03/04/22 90ds. Requesting a transfer Amlodipine 5 mg daily -Picked up at [facility] on 03/02/22 90ds. Requesting a transfer Aspirin 81mg  1 daily -Pt has enough on hand  Patient is due for next adherence delivery on: 05/26/22. Called patient and reviewed medications and coordinated delivery.  This delivery to include: Aspirin 81mg -1 tab once daily  Meclizine 12.5mg -1 tab three times daily as needed   Patient declined the following medications  Pravastatin 80mg - Sent to [facility] and picked up on 03/04/22 90ds Amlodipine Besylate 5mg  Sent to [facility] and picked up on 03/02/22 90ds Carvedilol 12.5mg -Sent to [facility] on 03/10/22 90ds Gabapentin 100mg - Filled on 02/26/22 30ds.  Patient needs refills  None  Confirmed delivery date of 05/26/22, advised patient that pharmacy will contact them the morning of delivery.   , CMA Clinical Pharmacist Assistant  [phone]

## 2022-05-20 ENCOUNTER — Telehealth: Payer: Self-pay | Admitting: Cardiology

## 2022-05-20 ENCOUNTER — Other Ambulatory Visit: Payer: Self-pay

## 2022-05-20 ENCOUNTER — Telehealth: Payer: Self-pay

## 2022-05-20 MED ORDER — PRAVASTATIN SODIUM 80 MG PO TABS: 80.0000 mg | ORAL_TABLET | Freq: Every evening | ORAL | 0 refills | Status: DC

## 2022-05-20 MED ORDER — AMLODIPINE BESYLATE 5 MG PO TABS: 5.0000 mg | ORAL_TABLET | Freq: Every day | ORAL | 0 refills | Status: DC

## 2022-05-20 NOTE — Telephone Encounter (Signed)
*  STAT* If patient is at the pharmacy, call can be transferred to refill team.   1. Which medications need to be refilled? (please list name of each medication and dose if known) amLODipine (NORVASC) 5 MG tablet pravastatin (PRAVACHOL) 80 MG tablet  2. Which pharmacy/location (including street and city if local pharmacy) is medication to be sent to? [id number] - [place], [place] - [address]  3. Do they need a 30 day or 90 day supply? 90 day supply

## 2022-05-20 NOTE — Telephone Encounter (Signed)
Amlodipine & Pravachol #90 ref x 0. Pt needs appt for follow up. Sent to pharmacy.

## 2022-05-20 NOTE — Progress Notes (Signed)
I called pt Cardiologist and requested 90ds refill on her Amlodipine and Pravastatin. The nurse will be sending these in to [facility]   Roxana Hires, [facility] Clinical Pharmacist Assistant  [phone]

## 2022-05-31 ENCOUNTER — Telehealth: Payer: Self-pay | Admitting: *Deleted

## 2022-05-31 NOTE — Patient Outreach (Signed)
  Care Coordination   Initial Visit Note   05/31/2022 Name: Sulay Brymer MRN: [id number] DOB: [birth year]  Mckynzi Cammon is a 81 y.o. year old female who sees [name], MD for primary care. I spoke with  [name] by phone today.  What matters to the patients health and wellness today?  Pt declines Care Coordination services at this time. CSW reminded pt to schedule her AWV with PCP     Goals Addressed   None     SDOH assessments and interventions completed:  No     Care Coordination Interventions Activated:  No  Care Coordination Interventions:  No, not indicated   Follow up plan: No further intervention required.   Encounter Outcome:  Pt. Refused

## 2022-06-14 ENCOUNTER — Ambulatory Visit: Payer: Medicare HMO | Admitting: Physician Assistant

## 2022-06-14 ENCOUNTER — Encounter: Payer: Self-pay | Admitting: Physician Assistant

## 2022-06-14 ENCOUNTER — Ambulatory Visit (INDEPENDENT_AMBULATORY_CARE_PROVIDER_SITE_OTHER): Payer: Medicare HMO | Admitting: Physician Assistant

## 2022-06-14 VITALS — BP 126/86 | HR 71 | Temp 97.6°F | Ht 59.0 in | Wt 184.6 lb

## 2022-06-14 DIAGNOSIS — G8929 Other chronic pain: Secondary | ICD-10-CM

## 2022-06-14 DIAGNOSIS — N958 Other specified menopausal and perimenopausal disorders: Secondary | ICD-10-CM

## 2022-06-14 DIAGNOSIS — E782 Mixed hyperlipidemia: Secondary | ICD-10-CM

## 2022-06-14 DIAGNOSIS — Z1231 Encounter for screening mammogram for malignant neoplasm of breast: Secondary | ICD-10-CM | POA: Diagnosis not present

## 2022-06-14 DIAGNOSIS — I2510 Atherosclerotic heart disease of native coronary artery without angina pectoris: Secondary | ICD-10-CM

## 2022-06-14 DIAGNOSIS — M5441 Lumbago with sciatica, right side: Secondary | ICD-10-CM | POA: Diagnosis not present

## 2022-06-14 DIAGNOSIS — I10 Essential (primary) hypertension: Secondary | ICD-10-CM

## 2022-06-14 DIAGNOSIS — M25512 Pain in left shoulder: Secondary | ICD-10-CM | POA: Diagnosis not present

## 2022-06-14 DIAGNOSIS — I471 Supraventricular tachycardia: Secondary | ICD-10-CM | POA: Diagnosis not present

## 2022-06-14 DIAGNOSIS — E559 Vitamin D deficiency, unspecified: Secondary | ICD-10-CM

## 2022-06-14 DIAGNOSIS — M5442 Lumbago with sciatica, left side: Secondary | ICD-10-CM

## 2022-06-14 DIAGNOSIS — M25551 Pain in right hip: Secondary | ICD-10-CM | POA: Diagnosis not present

## 2022-06-14 DIAGNOSIS — R7303 Prediabetes: Secondary | ICD-10-CM

## 2022-06-14 MED ORDER — PREDNISONE 20 MG PO TABS: ORAL_TABLET | ORAL | 0 refills | Status: AC

## 2022-06-14 NOTE — Progress Notes (Signed)
Subjective:  Patient ID: Tina Perkins, female    DOB: Jul 27, 1941  Age: 81 y.o. MRN: [id number]  Chief Complaint  Patient presents with   Hypertension    HPI  Pt presents for follow up of hypertension. Patient was diagnosed in 2010 The patient is tolerating the medication well without side effects. Compliance with treatment has been good; including taking medication as directed , maintains a healthy diet and regular exercise regimen , and following up as directed.currently taking norvasc 5mg  qd Pt also with history of PAT and CAD - follows with cardiology and due for follow up in November.  Mixed hyperlipidemia  Pt presents with hyperlipidemia.  Compliance with treatment has been good The patient is compliant with medications, maintains a low cholesterol diet , follows up as directed ,  . The patient denies experiencing any hypercholesterolemia related symptoms. She is currently on pravachol 80mg  qd  Pt with history of vitamin D def - currently on weekly supplement - due for labwork  Pt with history of cervical spondylosis with radiculopathy, chronic low back pain with sciatica and history of chronic knee pain with knee replacement - states she has not seen ortho in a few years since Dr December left but would like referral back to ortho for chronic issues but also she is having problems with right hip pain now and also her left shoulder 'sticks' at times to a point she has to use her right arm to move her left arm  Pt would like to get mammo and dexa scheduled   Current Outpatient Medications on File Prior to Visit  Medication Sig Dispense Refill   acetaminophen (TYLENOL) 500 MG tablet Take 500 mg by mouth every 6 (six) hours as needed for mild pain or moderate pain. Takes  as needed for pain     amLODipine (NORVASC) 5 MG tablet Take 1 tablet (5 mg total) by mouth daily. 90 tablet 0   aspirin EC 81 MG tablet Take 81 mg by mouth daily. Swallow whole.     carvedilol (COREG) 12.5 MG tablet  Take 1 tablet (12.5 mg total) by mouth 2 (two) times daily with a meal. 60 tablet 3   gabapentin (NEURONTIN) 100 MG capsule Take 100 mg by mouth at bedtime.     meclizine (ANTIVERT) 12.5 MG tablet Take 1 tablet (12.5 mg total) by mouth 3 (three) times daily as needed for dizziness. 30 tablet 2   pravastatin (PRAVACHOL) 80 MG tablet Take 1 tablet (80 mg total) by mouth every evening. 90 tablet 0   Vitamin D, Ergocalciferol, (DRISDOL) 1.25 MG (50000 UNIT) CAPS capsule Take 1 capsule (50,000 Units total) by mouth every 7 (seven) days. 12 capsule 2   nitroGLYCERIN (NITROSTAT) 0.4 MG SL tablet Place 1 tablet (0.4 mg total) under the tongue every 5 (five) minutes as needed for chest pain. 90 tablet 3   No current facility-administered medications on file prior to visit.   Past Medical History:  Diagnosis Date   Acute respiratory disease due to COVID-19 virus 08/12/2019   BMI 35.0-35.9,adult 03/31/2020   Chest pain, precordial 04/10/2020   Chronic bilateral low back pain with bilateral sciatica 09/15/2021   Last Assessment & Plan:  Formatting of this note might be different from the original. 81 year old female seen today in initial consultation for her chronic back pain with bilateral lower extremity radiculopathy, left greater than right.    Will request previous lumbar spine MRI for review.  At this time, I will start patient on  gabapentin 100 mg q.h.s. x2 weeks then increase her to 200 mg q.h.s.    Coronary artery disease involving native coronary artery of native heart without angina pectoris 05/10/2016   Apparently coronary intervention done in 1993 in [place] She was fine to have 75% narrowing of the mid LAD, nitroglycerin was given reduction to 30-40% it was also described as bridging in that area   CVA (cerebral vascular accident) (HCC)    2020   Daytime somnolence 09/15/2020   Elevated blood pressure 09/15/2021   Last Assessment & Plan:  Formatting of this note might be different from the  original. Blood pressure elevated in clinic today.  Patient will continue to follow-up with his primary care provider for further management.   Essential hypertension 04/10/2020   History of hepatitis 12/14/2019   Hypovitaminosis D 10/29/2021   Mixed hyperlipidemia 12/14/2019   Neck pain 09/15/2021   Last Assessment & Plan:  Formatting of this note might be different from the original. Atraumatic, acute onset cervical spine pain and stiffness x 2-3 weeks which has been progressive without improvement.  No associated fevers or chills.  Associated headaches, left shoulder, and left upper extremity pain that radiates down into her hand and fingers. This pain is most likely radicular in nature.      Obesity (BMI 30-39.9) 04/10/2020   Pain medication agreement 09/15/2021   Last Assessment & Plan:  Formatting of this note might be different from the original. Agreement signed and placed in the patient's chart today..  UDS to be collected in compliance with clinic policies and procedures.   Patient did not display any signs of abuse or diversion and has been checked on the [facility] Controlled Substance website.   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 Added automatically from request for surgery [id number] Formatting of this note might be different from the original. Added automatically from request for surgery [id number]   Past Surgical History:  Procedure Laterality Date   CESAREAN SECTION N/A    COLON SURGERY     TOTAL KNEE ARTHROPLASTY Bilateral    2017 and 2019    Family History   Problem Relation Age of Onset   Cancer Mother    Heart attack Mother    Heart attack Father    Cancer Father    Cancer Sister    Cancer Brother    Cancer Daughter    Social History   Socioeconomic History   Marital status: Widowed    Spouse name: Not on file   Number of children: 11   Years of education: 9   Highest education level: 9th grade  Occupational History   Occupation: Retired  Tobacco Use   Smoking status: Never    Passive exposure: Never   Smokeless tobacco: Never  Vaping Use   Vaping Use: Never used  Substance and Sexual Activity   Alcohol use: Never   Drug use: Never   Sexual activity: Not Currently  Other Topics Concern   Not on file  Social History Narrative   Not on file   Social Determinants of Health  Financial Resource Strain: Medium Risk (11/23/2021)   Overall Financial Resource Strain (CARDIA)    Difficulty of Paying Living Expenses: Somewhat hard  Food Insecurity: No Food Insecurity (02/18/2022)   Hunger Vital Sign    Worried About Running Out of Food in the Last Year: Never true    Ran Out of Food in the Last Year: Never true  Transportation Needs: No Transportation Needs (02/18/2022)   PRAPARE - Hydrologist (Medical): No    Lack of Transportation (Non-Medical): No  Physical Activity: Inactive (02/18/2022)   Exercise Vital Sign    Days of Exercise per Week: 0 days    Minutes of Exercise per Session: 0 min  Stress: No Stress Concern Present (07/30/2021)   [place]    Feeling of Stress : Only a little  Social Connections: Socially Isolated (02/18/2022)   Social Connection and Isolation Panel [NHANES]    Frequency of Communication with Friends and Family: Once a week    Frequency of Social Gatherings with Friends and Family: More than three times a week    Attends Religious Services: Never    Marine scientist or Organizations: No     Attends Archivist Meetings: Never    Marital Status: Widowed    Review of Systems CONSTITUTIONAL: Negative for chills, fatigue, fever, unintentional weight gain and unintentional weight loss.  E/N/T: Negative for ear pain, nasal congestion and sore throat.  CARDIOVASCULAR: Negative for chest pain, dizziness, palpitations and pedal edema.  RESPIRATORY: Negative for recent cough and dyspnea.  GASTROINTESTINAL: Negative for abdominal pain, acid reflux symptoms, constipation, diarrhea, nausea and vomiting.  MSK: see HPI INTEGUMENTARY: Negative for rash.  NEUROLOGICAL: Negative for dizziness and headaches.  PSYCHIATRIC: Negative for sleep disturbance and to question depression screen.  Negative for depression, negative for anhedonia.       Objective:  PHYSICAL EXAM:   VS: BP 126/86 (BP Location: Left Arm, Patient Position: Sitting, Cuff Size: Large)   Pulse 71   Temp 97.6 F (36.4 C) (Temporal)   Ht 4\' 11"  (1.499 m)   Wt 184 lb 9.6 oz (83.7 kg)   SpO2 93%   BMI 37.28 kg/m   GEN: Well nourished, well developed, in no acute distress  Cardiac: RRR; no murmurs, rubs, or gallops,no edema -  Respiratory:  normal respiratory rate and pattern with no distress - normal breath sounds with no rales, rhonchi, wheezes or rubs  MS: no deformity or atrophy - tenderness to left knee - rom of left arm normal but with discomfort -- pt is walking with cane for right hip discomfort Skin: warm and dry, no rash  Neuro:  Alert and Oriented x 3, Strength and sensation are intact - CN II-Xii grossly intact Psych: euthymic mood, appropriate affect and demeanor  Lab Results  Component Value Date   WBC 4.7 03/10/2022   HGB 14.2 03/10/2022   HCT 42.6 03/10/2022   PLT 194 03/10/2022   GLUCOSE 168 (H) 03/10/2022   CHOL 169 03/10/2022   TRIG 191 (H) 03/10/2022   HDL 39 (L) 03/10/2022   LDLCALC 97 03/10/2022   ALT 21 03/10/2022   AST 22 03/10/2022   NA 140 03/10/2022   K 4.3 03/10/2022    CL 107 (H) 03/10/2022   CREATININE 0.70 03/10/2022   BUN 14 03/10/2022   CO2 17 (L) 03/10/2022   TSH 0.548 03/10/2022   HGBA1C 6.7 (H) 03/10/2022  Assessment & Plan:   Problem List Items Addressed This Visit       Cardiovascular and Mediastinum   Coronary artery disease involving native coronary artery of native heart without angina pectoris   Relevant Orders   CBC with Differential/Platelet   Comprehensive metabolic panel   TSH   Essential hypertension   Relevant Orders   CBC with Differential/Platelet   Comprehensive metabolic panel   TSH   PAT (paroxysmal atrial tachycardia) (HCC)   Relevant Orders   CBC with Differential/Platelet   Comprehensive metabolic panel   TSH     Nervous and Auditory   Chronic bilateral low back pain with bilateral sciatica   Relevant Medications   predniSONE (DELTASONE) 20 MG tablet   Other Relevant Orders   Ambulatory referral to Orthopedic Surgery     Other   Mixed hyperlipidemia - Primary   Relevant Orders   Lipid panel   Prediabetes   Relevant Orders   Hemoglobin A1c   Hypovitaminosis D   Relevant Orders   VITAMIN D 25 Hydroxy (Vit-D Deficiency, Fractures)   Right hip pain   Relevant Medications   predniSONE (DELTASONE) 20 MG tablet   Other Relevant Orders   Ambulatory referral to Orthopedic Surgery   Acute pain of left shoulder   Relevant Medications   predniSONE (DELTASONE) 20 MG tablet   Other Relevant Orders   Ambulatory referral to Orthopedic Surgery   Other Visit Diagnoses     Other specified menopausal and perimenopausal disorders       Relevant Orders   DG Bone Density   Visit for screening mammogram       Relevant Orders   MM DIGITAL SCREENING BILATERAL     .  Meds ordered this encounter  Medications   predniSONE (DELTASONE) 20 MG tablet    Sig: Take 3 tablets (60 mg total) by mouth daily with breakfast for 3 days, THEN 2 tablets (40 mg total) daily with breakfast for 3 days, THEN 1 tablet (20 mg  total) daily with breakfast for 3 days.    Dispense:  18 tablet    Refill:  0    Order Specific Question:   Supervising Provider    Answer:   Shelton Silvas    Orders Placed This Encounter  Procedures   MM DIGITAL SCREENING BILATERAL   DG Bone Density   CBC with Differential/Platelet   Comprehensive metabolic panel   TSH   Lipid panel   Hemoglobin A1c   VITAMIN D 25 Hydroxy (Vit-D Deficiency, Fractures)   Ambulatory referral to Orthopedic Surgery     Follow-up: Return in about 4 months (around 10/14/2022) for chronic fasting follow up.  An After Visit Summary was printed and given to the patient.  Yetta Flock [facility] [phone]

## 2022-06-15 ENCOUNTER — Other Ambulatory Visit: Payer: Self-pay | Admitting: Physician Assistant

## 2022-06-15 DIAGNOSIS — E1165 Type 2 diabetes mellitus with hyperglycemia: Secondary | ICD-10-CM

## 2022-06-15 DIAGNOSIS — E559 Vitamin D deficiency, unspecified: Secondary | ICD-10-CM

## 2022-06-15 LAB — COMPREHENSIVE METABOLIC PANEL
ALT: 30 IU/L (ref 0–32)
AST: 24 IU/L (ref 0–40)
Albumin/Globulin Ratio: 1.7 (ref 1.2–2.2)
Albumin: 4.4 g/dL (ref 3.7–4.7)
Alkaline Phosphatase: 82 IU/L (ref 44–121)
BUN/Creatinine Ratio: 26 (ref 12–28)
BUN: 19 mg/dL (ref 8–27)
Bilirubin Total: 0.5 mg/dL (ref 0.0–1.2)
CO2: 16 mmol/L — ABNORMAL LOW (ref 20–29)
Calcium: 9.9 mg/dL (ref 8.7–10.3)
Chloride: 104 mmol/L (ref 96–106)
Creatinine, Ser: 0.73 mg/dL (ref 0.57–1.00)
Globulin, Total: 2.6 g/dL (ref 1.5–4.5)
Glucose: 159 mg/dL — ABNORMAL HIGH (ref 70–99)
Potassium: 4.8 mmol/L (ref 3.5–5.2)
Sodium: 138 mmol/L (ref 134–144)
Total Protein: 7 g/dL (ref 6.0–8.5)
eGFR: 83 mL/min/{1.73_m2} (ref >59)

## 2022-06-15 LAB — CBC WITH DIFFERENTIAL/PLATELET
Basophils Absolute: 0.1 10*3/uL (ref 0.0–0.2)
Basos: 1 %
EOS (ABSOLUTE): 0.1 10*3/uL (ref 0.0–0.4)
Eos: 2 %
Hematocrit: 46 % (ref 34.0–46.6)
Hemoglobin: 15 g/dL (ref 11.1–15.9)
Immature Grans (Abs): 0 10*3/uL (ref 0.0–0.1)
Immature Granulocytes: 0 %
Lymphocytes Absolute: 1.8 10*3/uL (ref 0.7–3.1)
Lymphs: 30 %
MCH: 29 pg (ref 26.6–33.0)
MCHC: 32.6 g/dL (ref 31.5–35.7)
MCV: 89 fL (ref 79–97)
Monocytes Absolute: 0.5 10*3/uL (ref 0.1–0.9)
Monocytes: 8 %
Neutrophils Absolute: 3.5 10*3/uL (ref 1.4–7.0)
Neutrophils: 59 %
Platelets: 194 10*3/uL (ref 150–450)
RBC: 5.18 x10E6/uL (ref 3.77–5.28)
RDW: 11.7 % (ref 11.7–15.4)
WBC: 6 10*3/uL (ref 3.4–10.8)

## 2022-06-15 LAB — LIPID PANEL
Chol/HDL Ratio: 4 ratio (ref 0.0–4.4)
Cholesterol, Total: 185 mg/dL (ref 100–199)
HDL: 46 mg/dL (ref >39)
LDL Chol Calc (NIH): 104 mg/dL — ABNORMAL HIGH (ref 0–99)
Triglycerides: 204 mg/dL — ABNORMAL HIGH (ref 0–149)
VLDL Cholesterol Cal: 35 mg/dL (ref 5–40)

## 2022-06-15 LAB — VITAMIN D 25 HYDROXY (VIT D DEFICIENCY, FRACTURES): Vit D, 25-Hydroxy: 17.1 ng/mL — ABNORMAL LOW (ref 30.0–100.0)

## 2022-06-15 LAB — CARDIOVASCULAR RISK ASSESSMENT

## 2022-06-15 LAB — TSH: TSH: 0.643 u[IU]/mL (ref 0.450–4.500)

## 2022-06-15 LAB — HEMOGLOBIN A1C
Est. average glucose Bld gHb Est-mCnc: 160 mg/dL
Hgb A1c MFr Bld: 7.2 % — ABNORMAL HIGH (ref 4.8–5.6)

## 2022-06-15 MED ORDER — VITAMIN D (ERGOCALCIFEROL) 1.25 MG (50000 UNIT) PO CAPS: ORAL_CAPSULE | ORAL | 5 refills | Status: DC

## 2022-06-15 MED ORDER — METFORMIN HCL 500 MG PO TABS: 500.0000 mg | ORAL_TABLET | Freq: Two times a day (BID) | ORAL | 2 refills | Status: DC

## 2022-06-17 ENCOUNTER — Telehealth: Payer: Self-pay

## 2022-06-17 ENCOUNTER — Encounter: Payer: Self-pay | Admitting: Physician Assistant

## 2022-06-17 NOTE — Progress Notes (Unsigned)
Chronic Care Management Pharmacy Assistant   Name: Tina Perkins  MRN: [id number] DOB: 1941-01-12   Reason for Encounter: Medication Coordination for [facility]   Recent office visits:  06/15/22 Marianne Sofia PA-C. Orders only. Ordered Metformin 500mg  and Vitamin D 50,000 units.   06/14/22 08/14/22 PA-C. Seen for routine visit. Referral to Orthopedic surgery. Decreased Asprin to 81mg .  Recent consult visits:  None  Hospital visits:  None  Medications: Outpatient Encounter Medications as of 06/17/2022  Medication Sig   acetaminophen (TYLENOL) 500 MG tablet Take 500 mg by mouth every 6 (six) hours as needed for mild pain or moderate pain. Takes  as needed for pain   amLODipine (NORVASC) 5 MG tablet Take 1 tablet (5 mg total) by mouth daily.   aspirin EC 81 MG tablet Take 81 mg by mouth daily. Swallow whole.   carvedilol (COREG) 12.5 MG tablet Take 1 tablet (12.5 mg total) by mouth 2 (two) times daily with a meal.   gabapentin (NEURONTIN) 100 MG capsule Take 100 mg by mouth at bedtime.   meclizine (ANTIVERT) 12.5 MG tablet Take 1 tablet (12.5 mg total) by mouth 3 (three) times daily as needed for dizziness.   metFORMIN (GLUCOPHAGE) 500 MG tablet Take 1 tablet (500 mg total) by mouth 2 (two) times daily with a meal.   nitroGLYCERIN (NITROSTAT) 0.4 MG SL tablet Place 1 tablet (0.4 mg total) under the tongue every 5 (five) minutes as needed for chest pain.   pravastatin (PRAVACHOL) 80 MG tablet Take 1 tablet (80 mg total) by mouth every evening.   predniSONE (DELTASONE) 20 MG tablet Take 3 tablets (60 mg total) by mouth daily with breakfast for 3 days, THEN 2 tablets (40 mg total) daily with breakfast for 3 days, THEN 1 tablet (20 mg total) daily with breakfast for 3 days.   Vitamin D, Ergocalciferol, (DRISDOL) 1.25 MG (50000 UNIT) CAPS capsule Take one pill twice weekly   No facility-administered encounter medications on file as of 06/17/2022.    Reviewed chart for medication changes  ahead of medication coordination call.  No Consults, or hospital visits since last care coordination call/Pharmacist visit.   BP Readings from Last 3 Encounters:  06/14/22 126/86  03/10/22 124/76  03/02/22 138/90    Lab Results  Component Value Date   HGBA1C 7.2 (H) 06/14/2022     Patient obtains medications through Vials  90 Days   Last adherence delivery included:  Aspirin 81mg -1 tab once daily             Meclizine 12.5mg -1 tab three times daily as needed   Patient declined (meds) last month Pravastatin 80mg - Sent to [facility] and picked up on 03/04/22 90ds Amlodipine Besylate 5mg  Sent to [facility] and picked up on 03/02/22 90ds Carvedilol 12.5mg -Sent to [facility] on 03/10/22 90ds Gabapentin 100mg - Filled on 02/26/22 30ds.  Patient is due for next adherence delivery on: 06/26/22. Called patient and reviewed medications and coordinated delivery.  This delivery to include: Aspirin 81mg -1 tab once daily             Meclizine 12.5mg -1 tab three times daily as needed  Pravastatin 80mg -1 tab once daily   Amlodipine 5mg -1 tab once daily   Carvedilol 12.5mg -1 tab twice daily   Patient declined the following medications -Request transfer Metformin 500mg - Filled at [facility] 06/15/22 90ds Vitamin D 50,000units-Filled at [facility] on 03/11/22 84ds Nitroglycerin 0.4-Does not need, only as prn   Patient needs refills -Request Sent  Carvedilol 12.5mg   Confirmed  delivery date of 06/26/22, advised patient that pharmacy will contact them the morning of delivery.   Roxana Hires, CMA Clinical Pharmacist Assistant  [phone]

## 2022-06-21 ENCOUNTER — Other Ambulatory Visit: Payer: Self-pay

## 2022-06-21 DIAGNOSIS — I2510 Atherosclerotic heart disease of native coronary artery without angina pectoris: Secondary | ICD-10-CM

## 2022-06-21 DIAGNOSIS — I10 Essential (primary) hypertension: Secondary | ICD-10-CM

## 2022-06-21 MED ORDER — CARVEDILOL 12.5 MG PO TABS: 12.5000 mg | ORAL_TABLET | Freq: Two times a day (BID) | ORAL | 3 refills | Status: DC

## 2022-06-29 ENCOUNTER — Ambulatory Visit: Payer: Medicare HMO | Admitting: Physician Assistant

## 2022-07-16 ENCOUNTER — Telehealth: Payer: Self-pay

## 2022-07-16 NOTE — Chronic Care Management (AMB) (Signed)
Chronic Care Management Pharmacy Assistant   Name: Tina Perkins  MRN: [id number] DOB: 14-Aug-1941  Reason for Encounter: Medication Review/ Medication Coordination  Recent office visits:  None  Recent consult visits:  None  Hospital visits:  None in previous 6 months  Medications: Outpatient Encounter Medications as of 07/16/2022  Medication Sig   acetaminophen (TYLENOL) 500 MG tablet Take 500 mg by mouth every 6 (six) hours as needed for mild pain or moderate pain. Takes  as needed for pain   amLODipine (NORVASC) 5 MG tablet Take 1 tablet (5 mg total) by mouth daily.   aspirin EC 81 MG tablet Take 81 mg by mouth daily. Swallow whole.   carvedilol (COREG) 12.5 MG tablet Take 1 tablet (12.5 mg total) by mouth 2 (two) times daily with a meal.   gabapentin (NEURONTIN) 100 MG capsule Take 100 mg by mouth at bedtime.   meclizine (ANTIVERT) 12.5 MG tablet Take 1 tablet (12.5 mg total) by mouth 3 (three) times daily as needed for dizziness.   metFORMIN (GLUCOPHAGE) 500 MG tablet Take 1 tablet (500 mg total) by mouth 2 (two) times daily with a meal.   nitroGLYCERIN (NITROSTAT) 0.4 MG SL tablet Place 1 tablet (0.4 mg total) under the tongue every 5 (five) minutes as needed for chest pain.   pravastatin (PRAVACHOL) 80 MG tablet Take 1 tablet (80 mg total) by mouth every evening.   Vitamin D, Ergocalciferol, (DRISDOL) 1.25 MG (50000 UNIT) CAPS capsule Take one pill twice weekly   No facility-administered encounter medications on file as of 07/16/2022.  Reviewed chart for medication changes ahead of medication coordination call.  No hospital visits since last care coordination call/Pharmacist visit.  No medication changes indicated  BP Readings from Last 3 Encounters:  06/14/22 126/86  03/10/22 124/76  03/02/22 138/90    Lab Results  Component Value Date   HGBA1C 7.2 (H) 06/14/2022     Patient obtains medications through Vials  30 Days   Last adherence delivery included:   Aspirin 81mg -1 tab once daily             Meclizine 12.5mg -1 tab three times daily as needed  Pravastatin 80mg -1 tab once daily      Amlodipine 5mg -1 tab once daily        Carvedilol 12.5mg -1 tab twice daily      Patient declined (meds) last month: Metformin 500mg - Filled at [facility] 06/15/22 90ds Vitamin D 50,000units-Filled at [facility] on 03/11/22 84ds Nitroglycerin 0.4-Does not need, only as prn   Patient is due for next adherence delivery on: 07-28-2022  Called patient and reviewed medications and coordinated delivery.  This delivery to include: Aspirin 81mg -1 tab once daily             Meclizine 12.5mg -1 tab three times daily as needed  Pravastatin 80mg -1 tab once daily      Amlodipine 5mg -1 tab once daily        Carvedilol 12.5mg -1 tab twice daily     Vitamin D3 1250 mcg- 1 capsule twice weekly Metformin 500 mg- twice daily- Tina Perkins is sending a 14 DS out on 07-16-2022  Patient declined the following medications: Nitroglycerin 0.4-Does not need, only as prn   Patient needs refills for: Sent to PCP Meclizine  Confirmed delivery date of 07-28-2022 advised patient that pharmacy will contact them the morning of delivery.  Care Gaps: None  Star Rating Drugs: Pravastatin 80 mg- Last filled 06-22-2022 30 DS [facility] Metformin 500 mg- Last filled 06-15-2022 90  DS [facility] (Patient stated she never picked medication up from [facility]. Confirmed with [facility])  [place] Clinical Pharmacist Assistant [phone]

## 2022-07-16 NOTE — Telephone Encounter (Signed)
Compliant on meds 

## 2022-07-19 ENCOUNTER — Other Ambulatory Visit: Payer: Self-pay

## 2022-07-19 DIAGNOSIS — R42 Dizziness and giddiness: Secondary | ICD-10-CM

## 2022-07-19 MED ORDER — MECLIZINE HCL 12.5 MG PO TABS: 12.5000 mg | ORAL_TABLET | Freq: Three times a day (TID) | ORAL | 2 refills | Status: DC | PRN

## 2022-08-07 DIAGNOSIS — R2681 Unsteadiness on feet: Secondary | ICD-10-CM | POA: Diagnosis not present

## 2022-08-07 DIAGNOSIS — L299 Pruritus, unspecified: Secondary | ICD-10-CM | POA: Diagnosis not present

## 2022-08-07 DIAGNOSIS — H8110 Benign paroxysmal vertigo, unspecified ear: Secondary | ICD-10-CM | POA: Diagnosis not present

## 2022-08-07 DIAGNOSIS — I639 Cerebral infarction, unspecified: Secondary | ICD-10-CM | POA: Diagnosis not present

## 2022-08-07 DIAGNOSIS — Z8673 Personal history of transient ischemic attack (TIA), and cerebral infarction without residual deficits: Secondary | ICD-10-CM | POA: Diagnosis not present

## 2022-08-07 DIAGNOSIS — Z7982 Long term (current) use of aspirin: Secondary | ICD-10-CM | POA: Diagnosis not present

## 2022-08-07 DIAGNOSIS — I252 Old myocardial infarction: Secondary | ICD-10-CM | POA: Diagnosis not present

## 2022-08-07 DIAGNOSIS — R7401 Elevation of levels of liver transaminase levels: Secondary | ICD-10-CM | POA: Diagnosis not present

## 2022-08-07 DIAGNOSIS — E785 Hyperlipidemia, unspecified: Secondary | ICD-10-CM | POA: Diagnosis not present

## 2022-08-07 DIAGNOSIS — R42 Dizziness and giddiness: Secondary | ICD-10-CM | POA: Diagnosis not present

## 2022-08-07 DIAGNOSIS — M1990 Unspecified osteoarthritis, unspecified site: Secondary | ICD-10-CM | POA: Diagnosis not present

## 2022-08-07 DIAGNOSIS — Z7984 Long term (current) use of oral hypoglycemic drugs: Secondary | ICD-10-CM | POA: Diagnosis not present

## 2022-08-07 DIAGNOSIS — Z79899 Other long term (current) drug therapy: Secondary | ICD-10-CM | POA: Diagnosis not present

## 2022-08-07 DIAGNOSIS — I10 Essential (primary) hypertension: Secondary | ICD-10-CM | POA: Diagnosis not present

## 2022-08-07 DIAGNOSIS — R9431 Abnormal electrocardiogram [ECG] [EKG]: Secondary | ICD-10-CM | POA: Diagnosis not present

## 2022-08-07 DIAGNOSIS — D649 Anemia, unspecified: Secondary | ICD-10-CM | POA: Diagnosis not present

## 2022-08-07 DIAGNOSIS — E119 Type 2 diabetes mellitus without complications: Secondary | ICD-10-CM | POA: Diagnosis not present

## 2022-08-07 DIAGNOSIS — R109 Unspecified abdominal pain: Secondary | ICD-10-CM | POA: Diagnosis not present

## 2022-08-07 DIAGNOSIS — Z882 Allergy status to sulfonamides status: Secondary | ICD-10-CM | POA: Diagnosis not present

## 2022-08-07 DIAGNOSIS — Z87442 Personal history of urinary calculi: Secondary | ICD-10-CM | POA: Diagnosis not present

## 2022-08-07 DIAGNOSIS — Z1152 Encounter for screening for COVID-19: Secondary | ICD-10-CM | POA: Diagnosis not present

## 2022-08-08 DIAGNOSIS — H8110 Benign paroxysmal vertigo, unspecified ear: Secondary | ICD-10-CM | POA: Diagnosis not present

## 2022-08-08 DIAGNOSIS — I10 Essential (primary) hypertension: Secondary | ICD-10-CM | POA: Diagnosis not present

## 2022-08-08 DIAGNOSIS — M1990 Unspecified osteoarthritis, unspecified site: Secondary | ICD-10-CM | POA: Diagnosis not present

## 2022-08-09 DIAGNOSIS — I10 Essential (primary) hypertension: Secondary | ICD-10-CM | POA: Diagnosis not present

## 2022-08-09 DIAGNOSIS — M1990 Unspecified osteoarthritis, unspecified site: Secondary | ICD-10-CM | POA: Diagnosis not present

## 2022-08-09 DIAGNOSIS — H8110 Benign paroxysmal vertigo, unspecified ear: Secondary | ICD-10-CM | POA: Diagnosis not present

## 2022-08-10 ENCOUNTER — Telehealth: Payer: Self-pay | Admitting: *Deleted

## 2022-08-10 ENCOUNTER — Encounter: Payer: Self-pay | Admitting: *Deleted

## 2022-08-10 NOTE — Patient Outreach (Signed)
  Care Coordination [facility] Note Transition Care Management Unsuccessful Follow-up Telephone Call  Date of discharge and from where:  Monday, 08/09/22- unknown location; KPN access verified hospitalization from November 4-6, 2023  Attempts:  1st Attempt  Reason for unsuccessful TCM follow-up call:  Left voice message  Oneta Rack, RN, BSN, CCRN Alumnus RN CM Care Coordination/ Transition of [place] Management [phone]: direct office

## 2022-08-11 ENCOUNTER — Telehealth: Payer: Self-pay | Admitting: *Deleted

## 2022-08-11 ENCOUNTER — Encounter: Payer: Self-pay | Admitting: *Deleted

## 2022-08-11 NOTE — Patient Outreach (Signed)
  Care Coordination [facility] Note Transition Care Management Follow-up Telephone Call Date of discharge and from where: Monday, 11/06/23[facility]; "weakness and flank pain, my CT scan was normal" How have you been since you were released from the hospital? "I am doing okay, feeling much better.  I am staying with my daughter for a few days, she is keeping an eye open for me.  They gave me some prednisone to take when I was released from the hospital; I am taking it like they told me to.... they did not make any other changes.  I am able to do most everything for myself to take care of myself, but my daughter is helping if I need anything.  I would like for the scheduling people to call me to schedule my appointment with the PA at [facility], I am kind of forgetful and might forget to call themself" Any questions or concerns? No  Items Reviewed: Did the pt receive and understand the discharge instructions provided? Yes  Medications obtained and verified? Yes  Other? No  Any new allergies since your discharge? No  Dietary orders reviewed? Yes Do you have support at home? Yes  daughter assisting as indicated/ needed; patient reports she is essentially independent in self-care  Home Care and Equipment/Supplies: Were home health services ordered? no If so, what is the name of the agency? N/A  Has the agency set up a time to come to the patient's home? not applicable Were any new equipment or medical supplies ordered?  No What is the name of the medical supply agency? N/A Were you able to get the supplies/equipment? not applicable Do you have any questions related to the use of the equipment or supplies? No N/A  Functional Questionnaire: (I = Independent and D = Dependent) ADLs: I  Bathing/Dressing- I  Meal Prep- I  Eating- I  Maintaining continence- I  Transferring/Ambulation- I  Managing Meds- I  Follow up appointments reviewed:  PCP Hospital f/u appt confirmed? No  Scheduled to  see - on - @ - Sent request to scheduling facilitator to schedule patient promptly with PCP Specialist Hospital f/u appt confirmed? No  Scheduled to see - on - @ - Are transportation arrangements needed? No  If their condition worsens, is the pt aware to call PCP or go to the Emergency Dept.? Yes Was the patient provided with contact information for the PCP's office or ED? Yes Was to pt encouraged to call back with questions or concerns? Yes  SDOH assessments and interventions completed:   Yes  Care Coordination Interventions Activated:  Yes   Care Coordination Interventions:  PCP follow up appointment requested   Encounter Outcome:  Pt. Visit Completed    Caryl Pina, RN, BSN, CCRN Alumnus RN CM Care Coordination/ Transition of Care- [facility] Care Management [phone]: direct office

## 2022-08-12 ENCOUNTER — Other Ambulatory Visit: Payer: Self-pay | Admitting: Cardiology

## 2022-08-13 ENCOUNTER — Telehealth: Payer: Self-pay

## 2022-08-13 DIAGNOSIS — R2681 Unsteadiness on feet: Secondary | ICD-10-CM | POA: Diagnosis not present

## 2022-08-13 DIAGNOSIS — R42 Dizziness and giddiness: Secondary | ICD-10-CM | POA: Diagnosis not present

## 2022-08-13 DIAGNOSIS — M6281 Muscle weakness (generalized): Secondary | ICD-10-CM | POA: Diagnosis not present

## 2022-08-13 NOTE — Progress Notes (Unsigned)
Chronic Care Management Pharmacy Assistant   Name: Tina Perkins  MRN: [id number] DOB: June 03, 1941  Reason for Encounter: Medication Review/ Medication coordination  Recent office visits:  None  Recent consult visits:  None  Hospital visits:  Medication Reconciliation was completed by comparing discharge summary, patient's EMR and Pharmacy list, and upon discussion with patient.  Admitted to the hospital on 08-07-2022 due to weakness and flank pain. Discharge date was 08-09-2022. Discharged from [facility].    New?Medications Started at [facility] Discharge:?? None  Medication Changes at Hospital Discharge: None  Medications Discontinued at Hospital Discharge: None  Medications that remain the same after Hospital Discharge:??  -All other medications will remain the same.    Medications: Outpatient Encounter Medications as of 08/13/2022  Medication Sig   acetaminophen (TYLENOL) 500 MG tablet Take 500 mg by mouth every 6 (six) hours as needed for mild pain or moderate pain. Takes  as needed for pain   amLODipine (NORVASC) 5 MG tablet Take 1 tablet (5 mg total) by mouth daily.   aspirin EC 81 MG tablet Take 81 mg by mouth daily. Swallow whole.   carvedilol (COREG) 12.5 MG tablet Take 1 tablet (12.5 mg total) by mouth 2 (two) times daily with a meal.   gabapentin (NEURONTIN) 100 MG capsule Take 100 mg by mouth at bedtime.   meclizine (ANTIVERT) 12.5 MG tablet Take 1 tablet (12.5 mg total) by mouth 3 (three) times daily as needed for dizziness.   metFORMIN (GLUCOPHAGE) 500 MG tablet Take 1 tablet (500 mg total) by mouth 2 (two) times daily with a meal.   nitroGLYCERIN (NITROSTAT) 0.4 MG SL tablet Place 1 tablet (0.4 mg total) under the tongue every 5 (five) minutes as needed for chest pain.   pravastatin (PRAVACHOL) 80 MG tablet Take 1 tablet (80 mg total) by mouth every evening.   Vitamin D, Ergocalciferol, (DRISDOL) 1.25 MG (50000 UNIT) CAPS capsule Take one pill twice  weekly   No facility-administered encounter medications on file as of 08/13/2022.  Reviewed chart for medication changes ahead of medication coordination call.  No medication changes indicated   BP Readings from Last 3 Encounters:  06/14/22 126/86  03/10/22 124/76  03/02/22 138/90    Lab Results  Component Value Date   HGBA1C 7.2 (H) 06/14/2022     Patient obtains medications through Vials  30 Days   Last adherence delivery included:  Aspirin 81mg -1 tab once daily             Meclizine 12.5mg -1 tab three times daily as needed  Pravastatin 80mg -1 tab once daily      Amlodipine 5mg -1 tab once daily        Carvedilol 12.5mg -1 tab twice daily     Vitamin D3 1250 mcg- 1 capsule twice weekly Metformin 500 mg- twice daily- Tina Perkins is sending a 14 DS out on 07-16-2022  Patient declined (meds) last month: Nitroglycerin 0.4-Does not need, only as prn   Patient is due for next adherence delivery on: 08-25-2022  Called patient and reviewed medications and coordinated delivery.  This delivery to include: Aspirin 81mg -1 tab once daily             Meclizine 12.5mg -1 tab three times daily as needed  Pravastatin 80mg -1 tab once daily      Amlodipine 5mg -1 tab once daily        Carvedilol 12.5mg -1 tab twice daily     Vitamin D3 1250 mcg- 1 capsule twice weekly Metformin 500 mg- twice  daily  Patient will need a short fill of (med), prior to adherence delivery. (To align with sync date or if PRN med)  Coordinated acute fill for (med) to be delivered (date).  Patient declined the following medications:  Patient needs refills for: Request sent by Tina Perkins Pravastatin  Amlodipine  Confirmed delivery date of ***, advised patient that pharmacy will contact them the morning of delivery.  08-13-2022: 1st attempt left VM  Care Gaps: Dexa scan overdue  Star Rating Drugs: Pravastatin 80 mg- Last filled 07-23-2022 30 DS Metformin 500 mg- Last filled 07-26-2022 30 DS  [facility]  CMA Clinical Pharmacist Assistant [phone]

## 2022-08-18 ENCOUNTER — Encounter: Payer: Self-pay | Admitting: Physician Assistant

## 2022-08-18 ENCOUNTER — Ambulatory Visit (INDEPENDENT_AMBULATORY_CARE_PROVIDER_SITE_OTHER): Payer: Medicare HMO | Admitting: Physician Assistant

## 2022-08-18 VITALS — BP 110/78 | HR 60 | Temp 96.9°F | Ht 59.0 in | Wt 186.8 lb

## 2022-08-18 DIAGNOSIS — R5381 Other malaise: Secondary | ICD-10-CM | POA: Diagnosis not present

## 2022-08-18 DIAGNOSIS — R2681 Unsteadiness on feet: Secondary | ICD-10-CM | POA: Diagnosis not present

## 2022-08-18 DIAGNOSIS — R1032 Left lower quadrant pain: Secondary | ICD-10-CM

## 2022-08-18 DIAGNOSIS — R42 Dizziness and giddiness: Secondary | ICD-10-CM | POA: Diagnosis not present

## 2022-08-18 DIAGNOSIS — M6281 Muscle weakness (generalized): Secondary | ICD-10-CM | POA: Diagnosis not present

## 2022-08-18 DIAGNOSIS — E1165 Type 2 diabetes mellitus with hyperglycemia: Secondary | ICD-10-CM | POA: Diagnosis not present

## 2022-08-18 LAB — POCT URINALYSIS DIP (CLINITEK)
Bilirubin, UA: NEGATIVE
Blood, UA: NEGATIVE
Glucose, UA: NEGATIVE mg/dL
Ketones, POC UA: NEGATIVE mg/dL
Nitrite, UA: NEGATIVE
POC PROTEIN,UA: NEGATIVE
Spec Grav, UA: 1.025 (ref 1.010–1.025)
Urobilinogen, UA: 1 E.U./dL
pH, UA: 6 (ref 5.0–8.0)

## 2022-08-18 MED ORDER — BLOOD GLUCOSE MONITOR KIT: PACK | 0 refills | Status: DC

## 2022-08-19 NOTE — Progress Notes (Signed)
Subjective:  Patient ID: Tina Perkins, female    DOB: Oct 29, 1940  Age: 81 y.o. MRN: [id number]  Chief Complaint  Patient presents with   Hospitalization Follow-up    Dizziness and Giddiness    HPI  Follow up Hospitalization  Patient was admitted to [facility]  on 08/07/22 and discharged on 08/09/22. She was treated for dizziness and vertigo. Treatment for this included CT /MRI of head which were normal - was monitored and .treated with fluids as well as management of chronic problems Telephone follow up was done on 08/11/22 She reports fair compliance with treatment. She reports this condition is improved but also states she is having GI issues which were not addressed at hospital.  She states she has had trouble with loose bowels and lower abdominal pain for a few months  Daughter with patient today and concerned/questioned all of her medications.  She is told that her cardiologist has prescribed norvasc 10m, coreg 12.5 mg and pravachol 855m She has hyperlipidemia/CAD/PAT Daughter concerned about her diabetes.  Pt was recently placed on glucophage after having hgb A1c of 7.2 2 months ago.  Pt was recommended for dietician and diabetic teaching which pt refused.  Daughter states to sign her up now.  Current Outpatient Medications on File Prior to Visit  Medication Sig Dispense Refill   acetaminophen (TYLENOL) 500 MG tablet Take 500 mg by mouth every 6 (six) hours as needed for mild pain or moderate pain. Takes  as needed for pain     amLODipine (NORVASC) 5 MG tablet Take 1 tablet (5 mg total) by mouth daily. 90 tablet 1   aspirin EC 81 MG tablet Take 81 mg by mouth daily. Swallow whole.     carvedilol (COREG) 12.5 MG tablet Take 1 tablet (12.5 mg total) by mouth 2 (two) times daily with a meal. 60 tablet 3   gabapentin (NEURONTIN) 100 MG capsule Take 100 mg by mouth at bedtime.     meclizine (ANTIVERT) 12.5 MG tablet Take 1 tablet (12.5 mg total) by mouth 3 (three) times daily as  needed for dizziness. 30 tablet 2   metFORMIN (GLUCOPHAGE) 500 MG tablet Take 1 tablet (500 mg total) by mouth 2 (two) times daily with a meal. 60 tablet 2   pravastatin (PRAVACHOL) 80 MG tablet Take 1 tablet (80 mg total) by mouth every evening. (Patient taking differently: Take 40 mg by mouth every evening.) 90 tablet 1   Vitamin D, Ergocalciferol, (DRISDOL) 1.25 MG (50000 UNIT) CAPS capsule Take one pill twice weekly 10 capsule 5   nitroGLYCERIN (NITROSTAT) 0.4 MG SL tablet Place 1 tablet (0.4 mg total) under the tongue every 5 (five) minutes as needed for chest pain. 90 tablet 3   No current facility-administered medications on file prior to visit.   Past Medical History:  Diagnosis Date   Acute respiratory disease due to COVID-19 virus 08/12/2019   BMI 35.0-35.9,adult 03/31/2020   Chest pain, precordial 04/10/2020   Chronic bilateral low back pain with bilateral sciatica 09/15/2021   Last Assessment & Plan:  Formatting of this note might be different from the original. [age]ear old female seen today in initial consultation for her chronic back pain with bilateral lower extremity radiculopathy, left greater than right.    Will request previous lumbar spine MRI for review.  At this time, I will start patient on gabapentin 100 mg q.h.s. x2 weeks then increase her to 200 mg q.h.s.    Coronary artery disease involving native coronary artery of  native heart without angina pectoris 05/10/2016   Apparently coronary intervention done in 1993 in [place] She was fine to have 75% narrowing of the mid LAD, nitroglycerin was given reduction to 30-40% it was also described as bridging in that area   CVA (cerebral vascular accident) ([place])    2020   Daytime somnolence 09/15/2020   Elevated blood pressure 09/15/2021   Last Assessment & Plan:  Formatting of this note might be different from the original. Blood pressure elevated in clinic today.  Patient will continue to follow-up with his primary care  provider for further management.   Essential hypertension 04/10/2020   History of hepatitis 12/14/2019   Hypovitaminosis D 10/29/2021   Mixed hyperlipidemia 12/14/2019   Neck pain 09/15/2021   Last Assessment & Plan:  Formatting of this note might be different from the original. Atraumatic, acute onset cervical spine pain and stiffness x 2-3 weeks which has been progressive without improvement.  No associated fevers or chills.  Associated headaches, left shoulder, and left upper extremity pain that radiates down into her hand and fingers. This pain is most likely radicular in nature.      Obesity (BMI 30-39.9) 04/10/2020   Pain medication agreement 09/15/2021   Last Assessment & Plan:  Formatting of this note might be different from the original. Agreement signed and placed in the patient's chart today..  UDS to be collected in compliance with clinic policies and procedures.   Patient did not display any signs of abuse or diversion and has been checked on the [facility] Controlled Substance website.   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 Added automatically from request for surgery [id number] Formatting of this note might be different from the original. Added automatically from request for surgery [id number]   Past Surgical History:  Procedure Laterality Date   CESAREAN SECTION N/A    COLON SURGERY     TOTAL KNEE ARTHROPLASTY Bilateral    2017 and 2019    Family History  Problem Relation Age of Onset   Cancer Mother    Heart attack Mother    Heart attack Father    Cancer  Father    Cancer Sister    Cancer Brother    Cancer Daughter    Social History   Socioeconomic History   Marital status: Widowed    Spouse name: Not on file   Number of children: 11   Years of education: 9   Highest education level: 9th grade  Occupational History   Occupation: Retired  Tobacco Use   Smoking status: Never    Passive exposure: Never   Smokeless tobacco: Never  Vaping Use   Vaping Use: Never used  Substance and Sexual Activity   Alcohol use: Never   Drug use: Never   Sexual activity: Not Currently  Other Topics Concern   Not on file  Social History Narrative   Not on file   Social Determinants of Health   Financial Resource Strain: Medium Risk (11/23/2021)   Overall Financial Resource Strain (CARDIA)    Difficulty of Paying Living Expenses: Somewhat hard  Food Insecurity: No Food Insecurity (08/11/2022)   Hunger Vital Sign    Worried About Running Out of Food in the Last Year: Never true    Ran Out of Food in the Last Year: Never true  Transportation Needs: No Transportation Needs (08/11/2022)   PRAPARE - Hydrologist (Medical): No    Lack of Transportation (Non-Medical): No  Physical Activity: Inactive (02/18/2022)   Exercise Vital Sign    Days of Exercise per Week: 0 days    Minutes of Exercise per Session: 0 min  Stress: No Stress Concern Present (07/30/2021)   [place]    Feeling of Stress : Only a little  Social Connections: Socially Isolated (02/18/2022)   Social Connection and Isolation Panel [NHANES]    Frequency of Communication with Friends and Family: Once a week    Frequency of Social Gatherings with Friends and Family: More than three times a week    Attends Religious Services: Never    Marine scientist or Organizations: No    Attends Archivist Meetings: Never    Marital Status: Widowed    Review of Systems CONSTITUTIONAL:  positive for general malaise for months E/N/T: Negative for ear pain, nasal congestion and sore throat.  CARDIOVASCULAR: Negative for chest pain, dizziness, palpitations and pedal edema.  RESPIRATORY: Negative for recent cough and dyspnea.  GASTROINTESTINAL: lower abdominal pain and change in bowels for few months INTEGUMENTARY: Negative for rash.      Objective:  PHYSICAL EXAM:   VS: BP 110/78 (BP Location: Left Arm, Patient Position: Sitting, Cuff Size: Large)   Pulse 60   Temp (!) 96.9 F (36.1 C) (Temporal)   Ht _0  (1.499 m)   Wt 186 lb 12.8 oz (84.7 kg)   SpO2 96%   BMI 37.73 kg/m   GEN: Well nourished, well developed, in no acute distress  HEENT: normal external ears and nose - normal external auditory canals and TMS - - Lips, Teeth and Gums - normal  Oropharynx - normal mucosa, palate, and posterior pharynx Neck: no JVD or masses - no thyromegaly Cardiac: RRR; no murmurs, rubs, or gallops,no edema -  Respiratory:  normal respiratory rate and pattern with no distress - normal breath sounds with no rales, rhonchi, wheezes or rubs GI: normal bowel sounds, no masses - tenderness to palpation of RLQ and LLQ - no reboung MS: no deformity or atrophy  Skin: warm and dry, no rash  Psych: euthymic mood, appropriate affect and demeanor  Office Visit on 08/18/2022  Component Date Value Ref Range Status   Color, UA 08/18/2022 yellow  yellow Final   Clarity, UA 08/18/2022 clear  clear Final   Glucose, UA 08/18/2022 negative  negative mg/dL Final   Bilirubin, UA 08/18/2022 negative  negative Final   Ketones, POC UA 08/18/2022 negative  negative mg/dL Final   Spec Grav, UA 08/18/2022 1.025  1.010 - 1.025 Final   Blood, UA 08/18/2022 negative  negative Final   pH, UA 08/18/2022 6.0  5.0 - 8.0 Final   POC PROTEIN,UA 08/18/2022 negative  negative, trace Final   Urobilinogen, UA 08/18/2022 1.0  0.2 or 1.0 E.U./dL Final   Nitrite, UA 08/18/2022 Negative  Negative Final    Leukocytes, UA 08/18/2022 Trace (A)  Negative Final    Lab Results  Component Value Date   WBC 6.0 06/14/2022   HGB 15.0 06/14/2022   HCT 46.0 06/14/2022  PLT 194 06/14/2022   GLUCOSE 159 (H) 06/14/2022   CHOL 185 06/14/2022   TRIG 204 (H) 06/14/2022   HDL 46 06/14/2022   LDLCALC 104 (H) 06/14/2022   ALT 30 06/14/2022   AST 24 06/14/2022   NA 138 06/14/2022   K 4.8 06/14/2022   CL 104 06/14/2022   CREATININE 0.73 06/14/2022   BUN 19 06/14/2022   CO2 16 (L) 06/14/2022   TSH 0.643 06/14/2022   HGBA1C 7.2 (H) 06/14/2022      Assessment & Plan:   Problem List Items Addressed This Visit   None Visit Diagnoses     Malaise    -  Primary   Relevant Orders   POCT URINALYSIS DIP (CLINITEK) (Completed)   CBC with Differential/Platelet   Comprehensive metabolic panel   TSH   Magnesium   LLQ pain       Relevant Orders   CBC with Differential/Platelet   Comprehensive metabolic panel   Magnesium   Left lower quadrant abdominal pain       Relevant Orders   CT Abdomen Pelvis W Contrast   Type 2 diabetes mellitus with hyperglycemia, without long-term current use of insulin (HCC)       Relevant Medications   blood glucose meter kit and supplies KIT   Other Relevant Orders   Ambulatory referral to diabetic education     .  Meds ordered this encounter  Medications   blood glucose meter kit and supplies KIT    Sig: Dispense based on patient and insurance preference. Use up to four times daily as directed.    Dispense:  1 each    Refill:  0    Order Specific Question:   Supervising Provider    AnswerShelton Silvas    Order Specific Question:   Number of strips    Answer:   100    Order Specific Question:   Number of lancets    Answer:   100    Orders Placed This Encounter  Procedures   CT Abdomen Pelvis W Contrast   CBC with Differential/Platelet   Comprehensive metabolic panel   TSH   Magnesium   Ambulatory referral to diabetic education   POCT  URINALYSIS DIP (CLINITEK)     Follow-up: Return in about 4 weeks (around 09/15/2022) for fasting follow up.  An After Visit Summary was printed and given to the patient.  Yetta Flock [facility] [phone]

## 2022-08-20 DIAGNOSIS — N959 Unspecified menopausal and perimenopausal disorder: Secondary | ICD-10-CM | POA: Diagnosis not present

## 2022-08-20 DIAGNOSIS — M810 Age-related osteoporosis without current pathological fracture: Secondary | ICD-10-CM | POA: Diagnosis not present

## 2022-08-20 DIAGNOSIS — Z1231 Encounter for screening mammogram for malignant neoplasm of breast: Secondary | ICD-10-CM | POA: Diagnosis not present

## 2022-08-20 LAB — MM DIGITAL SCREENING BILATERAL

## 2022-08-22 LAB — COMPREHENSIVE METABOLIC PANEL
ALT: 35 IU/L — ABNORMAL HIGH (ref 0–32)
AST: 26 IU/L (ref 0–40)
Albumin/Globulin Ratio: 2 (ref 1.2–2.2)
Albumin: 4.1 g/dL (ref 3.7–4.7)
Alkaline Phosphatase: 85 IU/L (ref 44–121)
BUN/Creatinine Ratio: 27 (ref 12–28)
BUN: 19 mg/dL (ref 8–27)
Bilirubin Total: 0.4 mg/dL (ref 0.0–1.2)
CO2: 24 mmol/L (ref 20–29)
Calcium: 9.8 mg/dL (ref 8.7–10.3)
Chloride: 101 mmol/L (ref 96–106)
Creatinine, Ser: 0.71 mg/dL (ref 0.57–1.00)
Globulin, Total: 2.1 g/dL (ref 1.5–4.5)
Glucose: 138 mg/dL — ABNORMAL HIGH (ref 70–99)
Potassium: 4.4 mmol/L (ref 3.5–5.2)
Sodium: 140 mmol/L (ref 134–144)
Total Protein: 6.2 g/dL (ref 6.0–8.5)
eGFR: 85 mL/min/{1.73_m2} (ref >59)

## 2022-08-22 LAB — TSH: TSH: 0.344 u[IU]/mL — ABNORMAL LOW (ref 0.450–4.500)

## 2022-08-22 LAB — CBC WITH DIFFERENTIAL/PLATELET
Basophils Absolute: 0.1 10*3/uL (ref 0.0–0.2)
Basos: 1 %
EOS (ABSOLUTE): 0.3 10*3/uL (ref 0.0–0.4)
Eos: 4 %
Hematocrit: 43.1 % (ref 34.0–46.6)
Hemoglobin: 14.4 g/dL (ref 11.1–15.9)
Immature Grans (Abs): 0 10*3/uL (ref 0.0–0.1)
Immature Granulocytes: 0 %
Lymphocytes Absolute: 2.5 10*3/uL (ref 0.7–3.1)
Lymphs: 34 %
MCH: 29.4 pg (ref 26.6–33.0)
MCHC: 33.4 g/dL (ref 31.5–35.7)
MCV: 88 fL (ref 79–97)
Monocytes Absolute: 0.7 10*3/uL (ref 0.1–0.9)
Monocytes: 10 %
Neutrophils Absolute: 3.7 10*3/uL (ref 1.4–7.0)
Neutrophils: 51 %
Platelets: 250 10*3/uL (ref 150–450)
RBC: 4.9 x10E6/uL (ref 3.77–5.28)
RDW: 11.8 % (ref 11.7–15.4)
WBC: 7.2 10*3/uL (ref 3.4–10.8)

## 2022-08-22 LAB — MAGNESIUM: Magnesium: 2.2 mg/dL (ref 1.6–2.3)

## 2022-08-24 DIAGNOSIS — N390 Urinary tract infection, site not specified: Secondary | ICD-10-CM | POA: Diagnosis not present

## 2022-08-24 DIAGNOSIS — N3001 Acute cystitis with hematuria: Secondary | ICD-10-CM | POA: Diagnosis not present

## 2022-08-24 DIAGNOSIS — Z79899 Other long term (current) drug therapy: Secondary | ICD-10-CM | POA: Diagnosis not present

## 2022-08-24 DIAGNOSIS — E860 Dehydration: Secondary | ICD-10-CM | POA: Diagnosis not present

## 2022-08-24 DIAGNOSIS — I700 Atherosclerosis of aorta: Secondary | ICD-10-CM | POA: Diagnosis not present

## 2022-08-24 DIAGNOSIS — M419 Scoliosis, unspecified: Secondary | ICD-10-CM | POA: Diagnosis not present

## 2022-08-24 DIAGNOSIS — M47814 Spondylosis without myelopathy or radiculopathy, thoracic region: Secondary | ICD-10-CM | POA: Diagnosis not present

## 2022-08-24 DIAGNOSIS — R1011 Right upper quadrant pain: Secondary | ICD-10-CM | POA: Diagnosis not present

## 2022-08-24 DIAGNOSIS — N281 Cyst of kidney, acquired: Secondary | ICD-10-CM | POA: Diagnosis not present

## 2022-08-24 DIAGNOSIS — I2510 Atherosclerotic heart disease of native coronary artery without angina pectoris: Secondary | ICD-10-CM | POA: Diagnosis not present

## 2022-08-24 DIAGNOSIS — I252 Old myocardial infarction: Secondary | ICD-10-CM | POA: Diagnosis not present

## 2022-08-24 DIAGNOSIS — N200 Calculus of kidney: Secondary | ICD-10-CM | POA: Diagnosis not present

## 2022-08-24 DIAGNOSIS — K429 Umbilical hernia without obstruction or gangrene: Secondary | ICD-10-CM | POA: Diagnosis not present

## 2022-08-24 DIAGNOSIS — K439 Ventral hernia without obstruction or gangrene: Secondary | ICD-10-CM | POA: Diagnosis not present

## 2022-08-25 ENCOUNTER — Other Ambulatory Visit: Payer: Self-pay

## 2022-08-25 DIAGNOSIS — Z1231 Encounter for screening mammogram for malignant neoplasm of breast: Secondary | ICD-10-CM

## 2022-08-30 ENCOUNTER — Other Ambulatory Visit: Payer: Self-pay

## 2022-08-30 ENCOUNTER — Other Ambulatory Visit: Payer: Self-pay | Admitting: Physician Assistant

## 2022-08-30 MED ORDER — LANCETS MISC: 3 refills | Status: DC

## 2022-08-30 MED ORDER — BLOOD GLUCOSE TEST STRIPS 333 VI STRP: ORAL_STRIP | 3 refills | Status: DC

## 2022-09-14 ENCOUNTER — Telehealth: Payer: Self-pay

## 2022-09-14 ENCOUNTER — Ambulatory Visit (INDEPENDENT_AMBULATORY_CARE_PROVIDER_SITE_OTHER): Payer: Medicare HMO

## 2022-09-14 DIAGNOSIS — E1165 Type 2 diabetes mellitus with hyperglycemia: Secondary | ICD-10-CM

## 2022-09-14 NOTE — Chronic Care Management (AMB) (Signed)
Chronic Care Management Pharmacy Assistant   Name: Tina Perkins  MRN: [id number] DOB: 1940-11-24  Reason for Encounter: Medication Review/ Medication coordination  Recent office visits:  08-18-2022 Marge Duncans, PA-C. Glucose= 138, ALT= 35. Abnormal UA. TSH= 0.344. CT abdomen pelvis w/ contrast ordered. Referral placed to diabetic education.  Recent consult visits:  None  Hospital visits:  Medication Reconciliation was completed by comparing discharge summary, patient's EMR and Pharmacy list, and upon discussion with patient.   Admitted to the hospital on 08-07-2022 due to weakness and flank pain. Discharge date was 08-09-2022. Discharged from [place]?Medications Started at [facility] Discharge:?? None   Medication Changes at Hospital Discharge: None   Medications Discontinued at Hospital Discharge: None   Medications that remain the same after Hospital Discharge:??  -All other medications will remain the same.    Medications: Outpatient Encounter Medications as of 09/14/2022  Medication Sig   Glucose Blood (BLOOD GLUCOSE TEST STRIPS 333) STRP Patient should check Glucose in the morning Fasting and evening 2 hours after meal.   Lancets MISC Patient should check Glucose in the morning Fasting and evening 2 hours after meal.   acetaminophen (TYLENOL) 500 MG tablet Take 500 mg by mouth every 6 (six) hours as needed for mild pain or moderate pain. Takes  as needed for pain   amLODipine (NORVASC) 5 MG tablet Take 1 tablet (5 mg total) by mouth daily.   aspirin EC 81 MG tablet Take 81 mg by mouth daily. Swallow whole.   blood glucose meter kit and supplies KIT Dispense based on patient and insurance preference. Use up to four times daily as directed.   carvedilol (COREG) 12.5 MG tablet Take 1 tablet (12.5 mg total) by mouth 2 (two) times daily with a meal.   gabapentin (NEURONTIN) 100 MG capsule Take 100 mg by mouth at bedtime.   meclizine (ANTIVERT) 12.5 MG  tablet Take 1 tablet (12.5 mg total) by mouth 3 (three) times daily as needed for dizziness.   metFORMIN (GLUCOPHAGE) 500 MG tablet Take 1 tablet (500 mg total) by mouth 2 (two) times daily with a meal.   nitroGLYCERIN (NITROSTAT) 0.4 MG SL tablet Place 1 tablet (0.4 mg total) under the tongue every 5 (five) minutes as needed for chest pain.   pravastatin (PRAVACHOL) 80 MG tablet Take 1 tablet (80 mg total) by mouth every evening. (Patient taking differently: Take 40 mg by mouth every evening.)   Vitamin D, Ergocalciferol, (DRISDOL) 1.25 MG (50000 UNIT) CAPS capsule Take one pill twice weekly   No facility-administered encounter medications on file as of 09/14/2022.  Reviewed chart for medication changes ahead of medication coordination call.  No medication changes indicated   BP Readings from Last 3 Encounters:  08/18/22 110/78  06/14/22 126/86  03/10/22 124/76    Lab Results  Component Value Date   HGBA1C 7.2 (H) 06/14/2022     Patient obtains medications through Vials  30 Days   Last adherence delivery included:  Aspirin 25m-1 tab once daily             Meclizine 12.555m1 tab three times daily as needed  Pravastatin 8025m tab once daily      Amlodipine 5mg62mtab once daily        Carvedilol 12.5mg-64mab twice daily     Vitamin D3 1250 mcg- 1 capsule twice weekly Metformin 500 mg- twice daily  Patient declined (meds) last month: Nitroglycerin 0.4-Does not need, only as prn  Gabapentin 100 mg daily- Fills at [facility]. Patient will get medication transferred to [facility] after this fill. Contacted [facility] and no refills are available. Contacted Linzie Collin, [place] office and was told patient will need an appointment for more refills. Gave patient office number since they've relocated and instructed her to make an appointment and if they are able to fill gabapentin to send in a 30 DS to [facility]. Tylenol- OTC  Patient is due for next adherence delivery on:  09-24-2022  Called patient and reviewed medications and coordinated delivery.  This delivery to include: Aspirin 68m-1 tab once daily             Meclizine 12.514m1 tab three times daily as needed  Pravastatin 8013m tab once daily      Amlodipine 5mg50mtab once daily        Carvedilol 12.5mg-28mab twice daily     Vitamin D3 1250 mcg- 1 capsule twice weekly Metformin 500 mg- twice daily  No acute/short needed  Patient declined the following medications: Nitroglycerin 0.4 mg- Plenty supply Tylenol- OTC Gabapentin- Patient needs appointment to fill meds Lancets- Patient stated she has a nurse visit today for teaching Test strips- Patient stated she has a nurse visit today for teaching  Patient needs refills for: None  Confirmed delivery date of 09-24-2022, advised patient that pharmacy will contact them the morning of delivery.   Care Gaps: Dexa scan overdue  Tdap overdue Uacr overdue  Star Rating Drugs: Pravastatin 80 mg- Last filled 08-20-2022 30 DS. Previous 07-23-2022 30 DS Metformin 500 mg- Last filled 08-20-2022 30 DS. Previous 07-23-2022 30 DS.  MalecMcKinnonmacist Assistant [phone]

## 2022-09-15 NOTE — Progress Notes (Signed)
Patient and her daughter met with diabetes educator - patient reports that she is not taking her metformin because her daughter says "it gives you cancer" and is now taking a natural supplement called Berberine.  It was recommended that she speak with the pharmacist about possible interactions.

## 2022-09-16 NOTE — Progress Notes (Signed)
Talk to patient, she stated that the metformin makes her sick, she went down to one a day and it still makes her sick, wanted to know what do you recommend for her to take other than metformin. Please advise

## 2022-09-17 ENCOUNTER — Encounter: Payer: Self-pay | Admitting: Physician Assistant

## 2022-09-17 ENCOUNTER — Ambulatory Visit (INDEPENDENT_AMBULATORY_CARE_PROVIDER_SITE_OTHER): Payer: Medicare HMO | Admitting: Physician Assistant

## 2022-09-17 VITALS — BP 122/70 | HR 51 | Temp 98.4°F | Resp 14 | Ht 59.0 in | Wt 184.0 lb

## 2022-09-17 DIAGNOSIS — R899 Unspecified abnormal finding in specimens from other organs, systems and tissues: Secondary | ICD-10-CM | POA: Diagnosis not present

## 2022-09-17 DIAGNOSIS — I4719 Other supraventricular tachycardia: Secondary | ICD-10-CM | POA: Diagnosis not present

## 2022-09-17 DIAGNOSIS — I2510 Atherosclerotic heart disease of native coronary artery without angina pectoris: Secondary | ICD-10-CM

## 2022-09-17 DIAGNOSIS — E782 Mixed hyperlipidemia: Secondary | ICD-10-CM | POA: Diagnosis not present

## 2022-09-17 DIAGNOSIS — I10 Essential (primary) hypertension: Secondary | ICD-10-CM | POA: Diagnosis not present

## 2022-09-17 DIAGNOSIS — E1165 Type 2 diabetes mellitus with hyperglycemia: Secondary | ICD-10-CM | POA: Diagnosis not present

## 2022-09-17 DIAGNOSIS — E559 Vitamin D deficiency, unspecified: Secondary | ICD-10-CM

## 2022-09-17 NOTE — Progress Notes (Signed)
Subjective:  Patient ID: Tina Perkins, female    DOB: May 12, 1941  Age: 81 y.o. MRN: [id number]  Chief Complaint  Patient presents with   Diabetes   Hyperlipidemia    Diabetes  Hyperlipidemia    Pt presents for follow up of hypertension. Patient was diagnosed in 2010 The patient is tolerating the medication well without side effects. Compliance with treatment has been good; including taking medication as directed , maintains a healthy diet and regular exercise regimen , and following up as directed.currently taking norvasc 64m qd Pt also with history of PAT and CAD - sees  cardiology and was due for a follow up but did not schedule - wants our office to schedule the follow up  Mixed hyperlipidemia  Pt presents with hyperlipidemia.  Compliance with treatment has been good The patient is compliant with medications, maintains a low cholesterol diet , follows up as directed ,  . The patient denies experiencing any hypercholesterolemia related symptoms. She is currently on pravachol 820mqd  Pt with history of vitamin D def - currently on weekly supplement - due for labwork  Pt with history of cervical spondylosis with radiculopathy, chronic low back pain with sciatica and history of chronic knee pain with knee replacement - states she has not seen ortho in a few years since Dr HuAdin Hectoreft - a referral was sent for her in September and a message left for pt to return call - she states she never got the message She is given number today to contact them to get appt  Pt with diabetes.  She was started on metformin but states she has stopped the medication due to side effects of malaise and nausea.  She started berberine supplements instead - she was advised this also can cause nausea and I do not recommend for treatment of diabetes.  Will await labwork and then discuss med options -    Current Outpatient Medications on File Prior to Visit  Medication Sig Dispense Refill   acetaminophen  (TYLENOL) 500 MG tablet Take 500 mg by mouth every 6 (six) hours as needed for mild pain or moderate pain. Takes  as needed for pain     amLODipine (NORVASC) 5 MG tablet Take 1 tablet (5 mg total) by mouth daily. 90 tablet 1   aspirin EC 81 MG tablet Take 81 mg by mouth daily. Swallow whole.     Berberine Chloride (BERBERINE HCI) 500 MG CAPS Take by mouth.     blood glucose meter kit and supplies KIT Dispense based on patient and insurance preference. Use up to four times daily as directed. 1 each 0   carvedilol (COREG) 12.5 MG tablet Take 1 tablet (12.5 mg total) by mouth 2 (two) times daily with a meal. 60 tablet 3   Glucose Blood (BLOOD GLUCOSE TEST STRIPS 333) STRP Patient should check Glucose in the morning Fasting and evening 2 hours after meal. (Patient not taking: Reported on 09/17/2022) 100 strip 3   Lancets MISC Patient should check Glucose in the morning Fasting and evening 2 hours after meal. (Patient not taking: Reported on 09/17/2022) 100 each 3   meclizine (ANTIVERT) 12.5 MG tablet Take 1 tablet (12.5 mg total) by mouth 3 (three) times daily as needed for dizziness. 30 tablet 2   nitroGLYCERIN (NITROSTAT) 0.4 MG SL tablet Place 1 tablet (0.4 mg total) under the tongue every 5 (five) minutes as needed for chest pain. 90 tablet 3   pravastatin (PRAVACHOL) 80 MG tablet Take  1 tablet (80 mg total) by mouth every evening. (Patient taking differently: Take 40 mg by mouth every evening.) 90 tablet 1   traMADol (ULTRAM) 50 MG tablet Take 50 mg by mouth 3 (three) times daily as needed.     Vitamin D, Ergocalciferol, (DRISDOL) 1.25 MG (50000 UNIT) CAPS capsule Take one pill twice weekly 10 capsule 5   gabapentin (NEURONTIN) 100 MG capsule Take 100 mg by mouth at bedtime. (Patient not taking: Reported on 09/17/2022)     No current facility-administered medications on file prior to visit.   Past Medical History:  Diagnosis Date   Acute respiratory disease due to COVID-19 virus 08/12/2019   BMI  35.0-35.9,adult 03/31/2020   Chest pain, precordial 04/10/2020   Chronic bilateral low back pain with bilateral sciatica 09/15/2021   Last Assessment & Plan:  Formatting of this note might be different from the original. 81 year old female seen today in initial consultation for her chronic back pain with bilateral lower extremity radiculopathy, left greater than right.    Will request previous lumbar spine MRI for review.  At this time, I will start patient on gabapentin 100 mg q.h.s. x2 weeks then increase her to 200 mg q.h.s.    Coronary artery disease involving native coronary artery of native heart without angina pectoris 05/10/2016   Apparently coronary intervention done in 1993 in [place] She was fine to have 75% narrowing of the mid LAD, nitroglycerin was given reduction to 30-40% it was also described as bridging in that area   CVA (cerebral vascular accident) ([place])    2020   Daytime somnolence 09/15/2020   Elevated blood pressure 09/15/2021   Last Assessment & Plan:  Formatting of this note might be different from the original. Blood pressure elevated in clinic today.  Patient will continue to follow-up with his primary care provider for further management.   Essential hypertension 04/10/2020   History of hepatitis 12/14/2019   Hypovitaminosis D 10/29/2021   Mixed hyperlipidemia 12/14/2019   Neck pain 09/15/2021   Last Assessment & Plan:  Formatting of this note might be different from the original. Atraumatic, acute onset cervical spine pain and stiffness x 2-3 weeks which has been progressive without improvement.  No associated fevers or chills.  Associated headaches, left shoulder, and left upper extremity pain that radiates down into her hand and fingers. This pain is most likely radicular in nature.      Obesity (BMI 30-39.9) 04/10/2020   Pain medication agreement 09/15/2021   Last Assessment & Plan:  Formatting of this note might be different from the original. Agreement signed and  placed in the patient's chart today..  UDS to be collected in compliance with clinic policies and procedures.   Patient did not display any signs of abuse or diversion and has been checked on the [facility] Controlled Substance website.   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 Added automatically from request for surgery [id number] Formatting of this note might be different from the original. Added automatically from request for surgery [id number]   Past Surgical History:  Procedure Laterality Date  CESAREAN SECTION N/A    COLON SURGERY     TOTAL KNEE ARTHROPLASTY Bilateral    2017 and 2019    Family History  Problem Relation Age of Onset   Cancer Mother    Heart attack Mother    Heart attack Father    Cancer Father    Cancer Sister    Cancer Brother    Cancer Daughter    Social History   Socioeconomic History   Marital status: Widowed    Spouse name: Not on file   Number of children: 11   Years of education: 9   Highest education level: 9th grade  Occupational History   Occupation: Retired  Tobacco Use   Smoking status: Never    Passive exposure: Never   Smokeless tobacco: Never  Vaping Use   Vaping Use: Never used  Substance and Sexual Activity   Alcohol use: Never   Drug use: Never   Sexual activity: Not Currently  Other Topics Concern   Not on file  Social History Narrative   Not on file   Social Determinants of Health   Financial Resource Strain: Medium Risk (11/23/2021)   Overall Financial Resource Strain (CARDIA)    Difficulty of Paying Living  Expenses: Somewhat hard  Food Insecurity: No Food Insecurity (08/11/2022)   Hunger Vital Sign    Worried About Running Out of Food in the Last Year: Never true    Ran Out of Food in the Last Year: Never true  Transportation Needs: No Transportation Needs (08/11/2022)   PRAPARE - Hydrologist (Medical): No    Lack of Transportation (Non-Medical): No  Physical Activity: Inactive (02/18/2022)   Exercise Vital Sign    Days of Exercise per Week: 0 days    Minutes of Exercise per Session: 0 min  Stress: No Stress Concern Present (07/30/2021)   [place]    Feeling of Stress : Only a little  Social Connections: Socially Isolated (02/18/2022)   Social Connection and Isolation Panel [NHANES]    Frequency of Communication with Friends and Family: Once a week    Frequency of Social Gatherings with Friends and Family: More than three times a week    Attends Religious Services: Never    Marine scientist or Organizations: No    Attends Archivist Meetings: Never    Marital Status: Widowed    CONSTITUTIONAL: Negative for chills, fatigue, fever, unintentional weight gain and unintentional weight loss.  E/N/T: Negative for ear pain, nasal congestion and sore throat.  CARDIOVASCULAR: Negative for chest pain, dizziness, palpitations and pedal edema.  RESPIRATORY: Negative for recent cough and dyspnea.  GASTROINTESTINAL: Negative for abdominal pain, acid reflux symptoms, constipation, diarrhea, nausea and vomiting.  MSK: see HPI INTEGUMENTARY: Negative for rash.  NEUROLOGICAL: Negative for dizziness and headaches.  PSYCHIATRIC: Negative for sleep disturbance and to question depression screen.  Negative for depression, negative for anhedonia.       Objective:  PHYSICAL EXAM:   VS: BP 122/70   Pulse (!) 51   Temp 98.4 F (36.9 C)   Resp 14   Ht 4' 11" (1.499 m)   Wt 184 lb (83.5 kg)    SpO2 99%   BMI 37.16 kg/m   GEN: Well nourished, well developed, in no acute distress  Cardiac: RRR; no murmurs, rubs, or gallops,no edema - Respiratory:  normal respiratory rate and pattern with no distress - normal breath  sounds with no rales, rhonchi, wheezes or rubs GI: normal bowel sounds, no masses or tenderness MS: no deformity or atrophy  Skin: warm and dry, no rash  Psych: euthymic mood, appropriate affect and demeanor   Lab Results  Component Value Date   WBC 7.2 08/18/2022   HGB 14.4 08/18/2022   HCT 43.1 08/18/2022   PLT 250 08/18/2022   GLUCOSE 138 (H) 08/18/2022   CHOL 185 06/14/2022   TRIG 204 (H) 06/14/2022   HDL 46 06/14/2022   LDLCALC 104 (H) 06/14/2022   ALT 35 (H) 08/18/2022   AST 26 08/18/2022   NA 140 08/18/2022   K 4.4 08/18/2022   CL 101 08/18/2022   CREATININE 0.71 08/18/2022   BUN 19 08/18/2022   CO2 24 08/18/2022   TSH 0.344 (L) 08/18/2022   HGBA1C 7.2 (H) 06/14/2022      Assessment & Plan:   Problem List Items Addressed This Visit       Cardiovascular and Mediastinum   Coronary artery disease involving native coronary artery of native heart without angina pectoris   Relevant Orders   Ambulatory referral to Cardiology   Essential hypertension   Relevant Orders   CBC with Differential/Platelet   Comprehensive metabolic panel   Thyroid Panel With TSH   Ambulatory referral to Cardiology   PAT (paroxysmal atrial tachycardia)   Relevant Orders   Ambulatory referral to Cardiology     Endocrine   Type 2 diabetes mellitus with hyperglycemia, without long-term current use of insulin ([place]) - Primary   Relevant Orders   CBC with Differential/Platelet   Comprehensive metabolic panel   Lipid panel   Hemoglobin A1c   Microalbumin / creatinine urine ratio     Other   Mixed hyperlipidemia   Relevant Orders   Comprehensive metabolic panel   Lipid panel   Hypovitaminosis D   Relevant Orders   VITAMIN D 25 Hydroxy (Vit-D Deficiency,  Fractures)   Abnormal laboratory test   Relevant Orders   Thyroid Panel With TSH  .  No orders of the defined types were placed in this encounter.   Orders Placed This Encounter  Procedures   CBC with Differential/Platelet   Comprehensive metabolic panel   Lipid panel   Hemoglobin A1c   VITAMIN D 25 Hydroxy (Vit-D Deficiency, Fractures)   Thyroid Panel With TSH   Microalbumin / creatinine urine ratio   Ambulatory referral to Cardiology     Follow-up: Return in about 4 months (around 01/17/2023) for chronic fasting follow up.  An After Visit Summary was printed and given to the patient.  Yetta Flock [facility] [phone]

## 2022-09-19 LAB — MICROALBUMIN / CREATININE URINE RATIO
Creatinine, Urine: 121.5 mg/dL
Microalb/Creat Ratio: 14 mg/g creat (ref 0–29)
Microalbumin, Urine: 16.5 ug/mL

## 2022-09-19 LAB — COMPREHENSIVE METABOLIC PANEL
ALT: 40 IU/L — ABNORMAL HIGH (ref 0–32)
AST: 35 IU/L (ref 0–40)
Albumin/Globulin Ratio: 1.9 (ref 1.2–2.2)
Albumin: 4.2 g/dL (ref 3.7–4.7)
Alkaline Phosphatase: 80 IU/L (ref 44–121)
BUN/Creatinine Ratio: 31 — ABNORMAL HIGH (ref 12–28)
BUN: 18 mg/dL (ref 8–27)
Bilirubin Total: 0.4 mg/dL (ref 0.0–1.2)
CO2: 21 mmol/L (ref 20–29)
Calcium: 9.2 mg/dL (ref 8.7–10.3)
Chloride: 106 mmol/L (ref 96–106)
Creatinine, Ser: 0.59 mg/dL (ref 0.57–1.00)
Globulin, Total: 2.2 g/dL (ref 1.5–4.5)
Glucose: 169 mg/dL — ABNORMAL HIGH (ref 70–99)
Potassium: 4.1 mmol/L (ref 3.5–5.2)
Sodium: 141 mmol/L (ref 134–144)
Total Protein: 6.4 g/dL (ref 6.0–8.5)
eGFR: 90 mL/min/{1.73_m2} (ref >59)

## 2022-09-19 LAB — CBC WITH DIFFERENTIAL/PLATELET
Basophils Absolute: 0.1 10*3/uL (ref 0.0–0.2)
Basos: 1 %
EOS (ABSOLUTE): 0.1 10*3/uL (ref 0.0–0.4)
Eos: 3 %
Hematocrit: 41 % (ref 34.0–46.6)
Hemoglobin: 13.6 g/dL (ref 11.1–15.9)
Immature Grans (Abs): 0 10*3/uL (ref 0.0–0.1)
Immature Granulocytes: 0 %
Lymphocytes Absolute: 1.7 10*3/uL (ref 0.7–3.1)
Lymphs: 38 %
MCH: 28.9 pg (ref 26.6–33.0)
MCHC: 33.2 g/dL (ref 31.5–35.7)
MCV: 87 fL (ref 79–97)
Monocytes Absolute: 0.4 10*3/uL (ref 0.1–0.9)
Monocytes: 10 %
Neutrophils Absolute: 2.1 10*3/uL (ref 1.4–7.0)
Neutrophils: 48 %
Platelets: 202 10*3/uL (ref 150–450)
RBC: 4.7 x10E6/uL (ref 3.77–5.28)
RDW: 11.6 % — ABNORMAL LOW (ref 11.7–15.4)
WBC: 4.4 10*3/uL (ref 3.4–10.8)

## 2022-09-19 LAB — HEMOGLOBIN A1C
Est. average glucose Bld gHb Est-mCnc: 160 mg/dL
Hgb A1c MFr Bld: 7.2 % — ABNORMAL HIGH (ref 4.8–5.6)

## 2022-09-19 LAB — CARDIOVASCULAR RISK ASSESSMENT

## 2022-09-19 LAB — LIPID PANEL
Chol/HDL Ratio: 2.7 ratio (ref 0.0–4.4)
Cholesterol, Total: 107 mg/dL (ref 100–199)
HDL: 39 mg/dL — ABNORMAL LOW (ref >39)
LDL Chol Calc (NIH): 45 mg/dL (ref 0–99)
Triglycerides: 133 mg/dL (ref 0–149)
VLDL Cholesterol Cal: 23 mg/dL (ref 5–40)

## 2022-09-19 LAB — THYROID PANEL WITH TSH
Free Thyroxine Index: 2.4 (ref 1.2–4.9)
T3 Uptake Ratio: 24 % (ref 24–39)
T4, Total: 9.9 ug/dL (ref 4.5–12.0)
TSH: 0.286 u[IU]/mL — ABNORMAL LOW (ref 0.450–4.500)

## 2022-09-19 LAB — VITAMIN D 25 HYDROXY (VIT D DEFICIENCY, FRACTURES): Vit D, 25-Hydroxy: 49.8 ng/mL (ref 30.0–100.0)

## 2022-09-20 ENCOUNTER — Telehealth: Payer: Medicare HMO

## 2022-09-21 ENCOUNTER — Other Ambulatory Visit: Payer: Self-pay | Admitting: Physician Assistant

## 2022-09-21 DIAGNOSIS — R7989 Other specified abnormal findings of blood chemistry: Secondary | ICD-10-CM

## 2022-09-21 DIAGNOSIS — E1165 Type 2 diabetes mellitus with hyperglycemia: Secondary | ICD-10-CM

## 2022-09-21 MED ORDER — RYBELSUS 3 MG PO TABS: 3.0000 mg | ORAL_TABLET | Freq: Every day | ORAL | 0 refills | Status: DC

## 2022-09-22 ENCOUNTER — Telehealth: Payer: Medicare HMO

## 2022-09-22 ENCOUNTER — Other Ambulatory Visit: Payer: Self-pay | Admitting: Physician Assistant

## 2022-09-22 DIAGNOSIS — R42 Dizziness and giddiness: Secondary | ICD-10-CM

## 2022-10-06 ENCOUNTER — Other Ambulatory Visit: Payer: Self-pay | Admitting: Family Medicine

## 2022-10-06 DIAGNOSIS — E559 Vitamin D deficiency, unspecified: Secondary | ICD-10-CM

## 2022-10-06 MED ORDER — VITAMIN D (ERGOCALCIFEROL) 1.25 MG (50000 UNIT) PO CAPS: ORAL_CAPSULE | ORAL | 5 refills | Status: DC

## 2022-10-11 ENCOUNTER — Other Ambulatory Visit: Payer: Self-pay | Admitting: Physician Assistant

## 2022-10-11 ENCOUNTER — Other Ambulatory Visit: Payer: Self-pay

## 2022-10-11 ENCOUNTER — Telehealth: Payer: Self-pay

## 2022-10-11 DIAGNOSIS — E1165 Type 2 diabetes mellitus with hyperglycemia: Secondary | ICD-10-CM

## 2022-10-11 MED ORDER — BLOOD GLUCOSE MONITOR KIT: PACK | 0 refills | Status: DC

## 2022-10-11 MED ORDER — ALENDRONATE SODIUM 70 MG PO TABS: 70.0000 mg | ORAL_TABLET | ORAL | 11 refills | Status: DC

## 2022-10-11 NOTE — Telephone Encounter (Signed)
Patient made aware of DEXA scan and agrees to taking Fosamax weekly and sated she can't remember is she was every diagnosed in the past or not.

## 2022-10-11 NOTE — Progress Notes (Unsigned)
Cardiology Office Note:    Date:  10/12/2022   ID:  [name], DOB 01/04/1941, MRN [id number]  PCP:  Marianne Sofia, PA-C  Cardiologist:  Norman Herrlich, MD    Referring MD: Marianne Sofia, PA-C    ASSESSMENT:    1. Coronary artery disease involving native coronary artery of native heart without angina pectoris   2. PAT (paroxysmal atrial tachycardia)   3. Essential hypertension   4. Dyslipidemia    PLAN:    In order of problems listed above:  [name] continues to do well with CAD having no anginal discomfort and will continue her current medical regimen including long-term aspirin beta-blocker calcium channel blocker and her statin with ideal LDL.  I would not advise a repeat ischemia evaluation at this time Stable symptomatology continue her beta-blocker she said she consider smoking Well-controlled continue her current treatment Lipids are at target tolerates her statin continue with current pravastatin dosage   Next appointment: Follow-up 1 year Dr. Bing Matter   Medication Adjustments/Labs and Tests Ordered: Current medicines are reviewed at length with the patient today.  Concerns regarding medicines are outlined above.  No orders of the defined types were placed in this encounter.  No orders of the defined types were placed in this encounter.  Chief complaint I am overdue for CAD follow-up and I was placed in your office hours     History of Present Illness:    Tina Perkins is a 82 y.o. female with a hx of type 2 diabetes hypertension hyperlipidemia paroxysmal atrial tachycardia and mild nonobstructive CAD with remote myocardial infarction in 1992 and coronary artery calcium score of 12 last seen 03/02/22. Records from outside cardiology relates she has not already will reach and one of her problems is PFO but not present on cardiac CTA and echocardiogram.  Compliance with diet, lifestyle and medications: Yes  Problem list incision is a patent foramen ovale but not  present on imaging I spoke to her and she has no awareness of this and I do not think it is a true diagnosis. He is done very well with her heart she has occasional palpitation infrequent every few months or if not sustained and she has worn monitors in the past I told her she could benefit from a smart watch she does not want to do it at this point in time She is not having edema shortness of breath chest pain palpitation or syncope Intolerant of metformin placed on Rybelsus last A1c 7.2 I strongly encouraged her to get involved in activity program she has chronic back pain and she could benefit from a recumbent bike or aquatics program Her LDL is ideal 45 and she has no muscle pain or weakness from her statin Past Medical History:  Diagnosis Date   Acute respiratory disease due to COVID-19 virus 08/12/2019   BMI 35.0-35.9,adult 03/31/2020   Chest pain, precordial 04/10/2020   Chronic bilateral low back pain with bilateral sciatica 09/15/2021   Last Assessment & Plan:  Formatting of this note might be different from the original. 82 year old female seen today in initial consultation for her chronic back pain with bilateral lower extremity radiculopathy, left greater than right.    Will request previous lumbar spine MRI for review.  At this time, I will start patient on gabapentin 100 mg q.h.s. x2 weeks then increase her to 200 mg q.h.s.    Coronary artery disease involving native coronary artery of native heart without angina pectoris 05/10/2016   Apparently coronary intervention done  in 1993 in [place] She was fine to have 75% narrowing of the mid LAD, nitroglycerin was given reduction to 30-40% it was also described as bridging in that area   CVA (cerebral vascular accident) ([facility])    2020   Daytime somnolence 09/15/2020   Elevated blood pressure 09/15/2021   Last Assessment & Plan:  Formatting of this note might be different from the original. Blood pressure elevated in clinic today.  Patient  will continue to follow-up with his primary care provider for further management.   Essential hypertension 04/10/2020   History of hepatitis 12/14/2019   Hypovitaminosis D 10/29/2021   Mixed hyperlipidemia 12/14/2019   Neck pain 09/15/2021   Last Assessment & Plan:  Formatting of this note might be different from the original. Atraumatic, acute onset cervical spine pain and stiffness x 2-3 weeks which has been progressive without improvement.  No associated fevers or chills.  Associated headaches, left shoulder, and left upper extremity pain that radiates down into her hand and fingers. This pain is most likely radicular in nature.      Obesity (BMI 30-39.9) 04/10/2020   Pain medication agreement 09/15/2021   Last Assessment & Plan:  Formatting of this note might be different from the original. Agreement signed and placed in the patient's chart today..  UDS to be collected in compliance with clinic policies and procedures.   Patient did not display any signs of abuse or diversion and has been checked on the [facility] Controlled Substance website.   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 Added automatically from request for surgery [id number] Formatting of this note might be different from the original. Added automatically from request for surgery [id number]    Past Surgical History:  Procedure Laterality Date   CESAREAN SECTION N/A    COLON SURGERY     TOTAL KNEE ARTHROPLASTY Bilateral    2017 and 2019    Current Medications: No outpatient medications have been marked as taking  for the 10/12/22 encounter (Office Visit) with Baldo Daub, MD.     Allergies:   Sulfa antibiotics and Metformin and related   Social History   Socioeconomic History   Marital status: Widowed    Spouse name: Not on file   Number of children: 11   Years of education: 9   Highest education level: 9th grade  Occupational History   Occupation: Retired  Tobacco Use   Smoking status: Never    Passive exposure: Never   Smokeless tobacco: Never  Vaping Use   Vaping Use: Never used  Substance and Sexual Activity   Alcohol use: Never   Drug use: Never   Sexual activity: Not Currently  Other Topics Concern   Not on file  Social History Narrative   Not on file   Social Determinants of Health   Financial Resource Strain: Medium Risk (11/23/2021)   Overall Financial Resource Strain (CARDIA)    Difficulty of Paying Living Expenses: Somewhat hard  Food Insecurity: No Food Insecurity (08/11/2022)   Hunger Vital Sign    Worried About Programme researcher, broadcasting/film/video in  the Last Year: Never true    Branch in the Last Year: Never true  Transportation Needs: No Transportation Needs (08/11/2022)   PRAPARE - Hydrologist (Medical): No    Lack of Transportation (Non-Medical): No  Physical Activity: Inactive (02/18/2022)   Exercise Vital Sign    Days of Exercise per Week: 0 days    Minutes of Exercise per Session: 0 min  Stress: No Stress Concern Present (07/30/2021)   [place]    Feeling of Stress : Only a little  Social Connections: Socially Isolated (02/18/2022)   Social Connection and Isolation Panel [NHANES]    Frequency of Communication with Friends and Family: Once a week    Frequency of Social Gatherings with Friends and Family: More than three times a week    Attends Religious Services: Never    Marine scientist or Organizations: No    Attends Archivist Meetings: Never     Marital Status: Widowed     Family History: The patient's family history includes Cancer in her brother, daughter, father, mother, and sister; Heart attack in her father and mother. ROS:   Please see the history of present illness.    All other systems reviewed and are negative.  EKGs/Labs/Other Studies Reviewed:    The following studies were reviewed today:  Echo cardiogram 11/04/2021 showed left ventricular ejection fraction 60 to 65% normal GLS normal right ventricular size and function and no valvular abnormality.  1. Focal hypertrophy of the basilar septum 18 mm. Left ventricular  ejection fraction, by estimation, is 60 to 65%. The left ventricle has  normal function. The left ventricle has no regional wall motion  abnormalities. Left ventricular diastolic  parameters are consistent with Grade I diastolic dysfunction (impaired  relaxation). The average left ventricular global longitudinal strain is  -18.6 %. The global longitudinal strain is normal.   2. Right ventricular systolic function is normal. The right ventricular  size is normal. Tricuspid regurgitation signal is inadequate for assessing  PA pressure.   3. The mitral valve is normal in structure. No evidence of mitral valve  regurgitation. No evidence of mitral stenosis.   4. The aortic valve is tricuspid. Aortic valve regurgitation is not  visualized. No aortic stenosis is present.   5. The inferior vena cava is normal in size with greater than 50%  respiratory variability, suggesting right atrial pressure of 3 mmHg.  Cardiac CTA 06/19/2020: IMPRESSION: 1. Coronary calcium score of 12. This was 23 percentile for age and sex matched control. 2. Normal coronary origin with right dominance. 3. Minimal Coronary artery disease. CADRAD 1. Recommend medical therapy. EKG:  EKG ordered today and personally reviewed.  The ekg ordered today demonstrates sinus rhythm 56 bpm left axis deviation otherwise normal EKG  Recent  Labs: 08/18/2022: Magnesium 2.2 09/17/2022: ALT 40; BUN 18; Creatinine, Ser 0.59; Hemoglobin 13.6; Platelets 202; Potassium 4.1; Sodium 141; TSH 0.286  Recent Lipid Panel    Component Value Date/Time   CHOL 107 09/17/2022 1058   TRIG 133 09/17/2022 1058   HDL 39 (L) 09/17/2022 1058   CHOLHDL 2.7 09/17/2022 1058   LDLCALC 45 09/17/2022 1058    Physical Exam:    VS:  BP 120/72 (BP Location: Right Arm, Patient Position: Sitting)   Pulse (!) 56   Ht 4\' 11"  (1.499 m)   Wt 185 lb (83.9 kg)   SpO2 98%  BMI 37.37 kg/m     Wt Readings from Last 3 Encounters:  10/12/22 185 lb (83.9 kg)  09/17/22 184 lb (83.5 kg)  08/18/22 186 lb 12.8 oz (84.7 kg)     GEN: She looks younger than her age she has no xanthoma or xanthelasma well nourished, well developed in no acute distress HEENT: Normal NECK: No JVD; No carotid bruits LYMPHATICS: No lymphadenopathy CARDIAC: RRR, no murmurs, rubs, gallops RESPIRATORY:  Clear to auscultation without rales, wheezing or rhonchi  ABDOMEN: Soft, non-tender, non-distended MUSCULOSKELETAL:  No edema; No deformity  SKIN: Warm and dry NEUROLOGIC:  Alert and oriented x 3 PSYCHIATRIC:  Normal affect    Signed, Norman Herrlich, MD  10/12/2022 3:05 PM    [facility]

## 2022-10-11 NOTE — Telephone Encounter (Signed)
Rx sent 

## 2022-10-12 ENCOUNTER — Encounter: Payer: Self-pay | Admitting: Cardiology

## 2022-10-12 ENCOUNTER — Other Ambulatory Visit: Payer: Self-pay

## 2022-10-12 ENCOUNTER — Other Ambulatory Visit: Payer: Self-pay | Admitting: Physician Assistant

## 2022-10-12 ENCOUNTER — Ambulatory Visit: Payer: Medicare HMO | Attending: Cardiology | Admitting: Cardiology

## 2022-10-12 VITALS — BP 120/72 | HR 56 | Ht 59.0 in | Wt 185.0 lb

## 2022-10-12 DIAGNOSIS — I2510 Atherosclerotic heart disease of native coronary artery without angina pectoris: Secondary | ICD-10-CM | POA: Diagnosis not present

## 2022-10-12 DIAGNOSIS — I4719 Other supraventricular tachycardia: Secondary | ICD-10-CM

## 2022-10-12 DIAGNOSIS — N958 Other specified menopausal and perimenopausal disorders: Secondary | ICD-10-CM

## 2022-10-12 DIAGNOSIS — I10 Essential (primary) hypertension: Secondary | ICD-10-CM

## 2022-10-12 DIAGNOSIS — E785 Hyperlipidemia, unspecified: Secondary | ICD-10-CM

## 2022-10-12 NOTE — Patient Instructions (Signed)
Medication Instructions:  Your physician recommends that you continue on your current medications as directed. Please refer to the Current Medication list given to you today.  *If you need a refill on your cardiac medications before your next appointment, please call your pharmacy*   Lab Work: None If you have labs (blood work) drawn today and your tests are completely normal, you will receive your results only by: MyChart Message (if you have MyChart) OR A paper copy in the mail If you have any lab test that is abnormal or we need to change your treatment, we will call you to review the results.   Testing/Procedures: None   Follow-Up: At [facility], you and your health needs are our priority.  As part of our continuing mission to provide you with exceptional heart care, we have created designated Provider Care Teams.  These Care Teams include your primary Cardiologist (physician) and Advanced Practice Providers (APPs -  Physician Assistants and Nurse Practitioners) who all work together to provide you with the care you need, when you need it.  We recommend signing up for the patient portal called "MyChart".  Sign up information is provided on this After Visit Summary.  MyChart is used to connect with patients for Virtual Visits (Telemedicine).  Patients are able to view lab/test results, encounter notes, upcoming appointments, etc.  Non-urgent messages can be sent to your provider as well.   To learn more about what you can do with MyChart, go to [email or web address].[email or web address].    Your next appointment:   1 year(s)  The format for your next appointment:   In Person  Provider:   Brian Munley, MD    Other Instructions None  Important Information About Sugar       

## 2022-10-14 ENCOUNTER — Other Ambulatory Visit: Payer: Self-pay

## 2022-10-14 ENCOUNTER — Telehealth: Payer: Self-pay

## 2022-10-14 DIAGNOSIS — E1165 Type 2 diabetes mellitus with hyperglycemia: Secondary | ICD-10-CM

## 2022-10-14 NOTE — Progress Notes (Signed)
Care Management & Coordination Services Pharmacy Team  Reason for Encounter: Medication coordination and delivery  Contacted patient on 10/14/2022 to discuss medications   Recent office visits:  09-17-2022 Marge Duncans, PA-C. RDW= 11.6.  Glucose= 169, BUN/Creatinine= 31, ALT= 40. A1C= 7.2. HDL= 39. TSH= 0.286. Referral placed to cardiology. Patient reported not taking metformin.  09-14-2022 Erie Noe, LPN. Visit for issues with metformin.  Recent consult visits:  10-12-2022 Richardo Priest, MD (Cardiology). Patient reported not taking gabapentin. EKG completed.  Hospital visits:  Medication Reconciliation was completed by comparing discharge summary, patient's EMR and Pharmacy list, and upon discussion with patient.   Admitted to the hospital on 08-07-2022 due to weakness and flank pain. Discharge date was 08-09-2022. Discharged from [facility]?Medications Started at [facility] Discharge:?? None   Medication Changes at Hospital Discharge: None   Medications Discontinued at Hospital Discharge: None   Medications that remain the same after Hospital Discharge:??  -All other medications will remain the same.    Medications: Outpatient Encounter Medications as of 10/14/2022  Medication Sig   alendronate (FOSAMAX) 70 MG tablet Take 1 tablet (70 mg total) by mouth every 7 (seven) days. Take with a full glass of water on an empty stomach.   Glucose Blood (BLOOD GLUCOSE TEST STRIPS 333) STRP Patient should check Glucose in the morning Fasting and evening 2 hours after meal. (Patient not taking: Reported on 09/17/2022)   Lancets MISC Patient should check Glucose in the morning Fasting and evening 2 hours after meal. (Patient not taking: Reported on 09/17/2022)   acetaminophen (TYLENOL) 500 MG tablet Take 500 mg by mouth every 6 (six) hours as needed for mild pain or moderate pain. Takes  as needed for pain   amLODipine (NORVASC) 5 MG tablet Take 1 tablet (5 mg total)  by mouth daily.   aspirin EC 81 MG tablet Take 81 mg by mouth daily. Swallow whole.   Berberine Chloride (BERBERINE HCI) 500 MG CAPS Take by mouth.   blood glucose meter kit and supplies KIT Dispense based on patient and insurance preference. Use up to four times daily as directed.   carvedilol (COREG) 12.5 MG tablet TAKE ONE TABLET BY MOUTH TWICE DAILY WITH A MEAL   meclizine (ANTIVERT) 12.5 MG tablet TAKE ONE TABLET BY MOUTH three times daily AS NEEDED FOR dizziness   nitroGLYCERIN (NITROSTAT) 0.4 MG SL tablet Place 1 tablet (0.4 mg total) under the tongue every 5 (five) minutes as needed for chest pain.   pravastatin (PRAVACHOL) 80 MG tablet Take 1 tablet (80 mg total) by mouth every evening. (Patient taking differently: Take 40 mg by mouth every evening.)   Semaglutide (RYBELSUS) 3 MG TABS Take 3 mg by mouth daily.   traMADol (ULTRAM) 50 MG tablet Take 50 mg by mouth 3 (three) times daily as needed.   Vitamin D, Ergocalciferol, (DRISDOL) 1.25 MG (50000 UNIT) CAPS capsule Take one pill twice weekly   No facility-administered encounter medications on file as of 10/14/2022.   BP Readings from Last 3 Encounters:  10/12/22 120/72  09/17/22 122/70  08/18/22 110/78    Pulse Readings from Last 3 Encounters:  10/12/22 (!) 56  09/17/22 (!) 51  08/18/22 60    Lab Results  Component Value Date/Time   HGBA1C 7.2 (H) 09/17/2022 10:58 AM   HGBA1C 7.2 (H) 06/14/2022 12:04 PM   Lab Results  Component Value Date   CREATININE 0.59 09/17/2022   BUN 18 09/17/2022   GFRNONAA  84 07/31/2020   GFRAA 97 07/31/2020   NA 141 09/17/2022   K 4.1 09/17/2022   CALCIUM 9.2 09/17/2022   CO2 21 09/17/2022     Last adherence delivery date: 09-24-2022  Patient is due for next adherence delivery on: 10-26-2022  Spoke with patient on 10-14-2022 reviewed medications and coordinated delivery.  This delivery to include: Vials  30 Days  Aspirin 81mg -1 tab once daily             Meclizine 12.5mg -1 tab three  times daily as needed  Pravastatin 80mg -1 tab once daily      Amlodipine 5mg -1 tab once daily        Carvedilol 12.5mg -1 tab twice daily     Vitamin D3 1250 mcg- 1 capsule twice weekly Rybelsus 3 mg daily- add to delivery sent request to PCP Alendronate 70 mg take every 7 days- Add to delivery One touch verio flex start kit 4 times daily as needed- Add to delivery sent request to PCP  Patient declined the following medications this month: Nitroglycerin 0.4-Does not need, only as prn   Metformin- D/C Tylenol- OTC   Refills requested from providers include: Rybelsus One touch verio flex start kit   Confirmed delivery date of 10-26-2022, advised patient that pharmacy will contact them the morning of delivery.  Any concerns about your medications? No  How often do you forget or accidentally miss a dose? Never  Do you use a pillbox? Yes  Is patient in packaging No  Any concerns or issues with your packaging? No   Recent blood pressure readings are as follows: 120/72 10-12-2022  Recent blood glucose readings are as follows: 7.2 09-17-2022   Cycle dispensing form sent to [place] Con[place] for review.   Tina Perkins

## 2022-10-15 ENCOUNTER — Other Ambulatory Visit: Payer: Self-pay

## 2022-10-15 ENCOUNTER — Other Ambulatory Visit: Payer: Self-pay | Admitting: Physician Assistant

## 2022-10-15 MED ORDER — RYBELSUS 3 MG PO TABS: 3.0000 mg | ORAL_TABLET | Freq: Every day | ORAL | 0 refills | Status: DC

## 2022-10-15 MED ORDER — ONETOUCH VERIO VI STRP: ORAL_STRIP | 12 refills | Status: DC

## 2022-10-15 MED ORDER — ONETOUCH ULTRASOFT LANCETS MISC: 12 refills | Status: DC

## 2022-10-15 MED ORDER — ONETOUCH VERIO FLEX SYSTEM W/DEVICE KIT: PACK | 0 refills | Status: DC

## 2022-10-19 ENCOUNTER — Ambulatory Visit: Payer: Medicare HMO

## 2022-10-19 ENCOUNTER — Ambulatory Visit: Payer: Medicare HMO | Admitting: Physician Assistant

## 2022-10-19 NOTE — Progress Notes (Signed)
Care Management & Coordination Services Pharmacy Note  10/19/2022 Name:  Tina Perkins MRN:  [id number] DOB:  July 12, 1941  Summary: -Pleasant 82 year old woman presents for initial CCM visit. She is lacking transportation and is very frustrated with her current state of health. She's tired all the time and has gained a lot of weight recently  Recommendations/Changes made from today's visit: -Coordinated to get patient new meter. Will be delivered on 10/23/22  Subjective: Tina Perkins is an 82 y.o. year old female who is a primary patient of Marianne Sofia, [place].  The care coordination team was consulted for assistance with disease management and care coordination needs.    Engaged with patient by telephone for follow up visit.  Recent office visits:  09-17-2022 Marianne Sofia, PA-C. RDW= 11.6.  Glucose= 169, BUN/Creatinine= 31, ALT= 40. A1C= 7.2. HDL= 39. TSH= 0.286. Referral placed to cardiology. Patient reported not taking metformin.   09-14-2022 Jacklynn Bue, LPN. Visit for issues with metformin.   Recent consult visits:  10-12-2022 Baldo Daub, MD (Cardiology). Patient reported not taking gabapentin. EKG completed.   Hospital visits:  Medication Reconciliation was completed by comparing discharge summary, patient's EMR and Pharmacy list, and upon discussion with patient.   Admitted to the hospital on 08-07-2022 due to weakness and flank pain. Discharge date was 08-09-2022. Discharged from [facility].     New?Medications Started at [facility] Discharge:?? None   Medication Changes at Hospital Discharge: None   Medications Discontinued at Hospital Discharge: None   Medications that remain the same after Hospital Discharge:??  -All other medications will remain the same.     Objective:  Lab Results  Component Value Date   CREATININE 0.59 09/17/2022   BUN 18 09/17/2022   EGFR 90 09/17/2022   GFRNONAA 84 07/31/2020   GFRAA 97 07/31/2020   NA 141 09/17/2022    K 4.1 09/17/2022   CALCIUM 9.2 09/17/2022   CO2 21 09/17/2022   GLUCOSE 169 (H) 09/17/2022    Lab Results  Component Value Date/Time   HGBA1C 7.2 (H) 09/17/2022 10:58 AM   HGBA1C 7.2 (H) 06/14/2022 12:04 PM    Last diabetic Eye exam: No results found for: "HMDIABEYEEXA"  Last diabetic Foot exam: No results found for: "HMDIABFOOTEX"   Lab Results  Component Value Date   CHOL 107 09/17/2022   HDL 39 (L) 09/17/2022   LDLCALC 45 09/17/2022   TRIG 133 09/17/2022   CHOLHDL 2.7 09/17/2022       Latest Ref Rng & Units 09/17/2022   10:58 AM 08/18/2022    3:50 PM 06/14/2022   12:04 PM  Hepatic Function  Total Protein 6.0 - 8.5 g/dL 6.4  6.2  7.0   Albumin 3.7 - 4.7 g/dL 4.2  4.1  4.4   AST 0 - 40 IU/L 35  26  24   ALT 0 - 32 IU/L 40  35  30   Alk Phosphatase 44 - 121 IU/L 80  85  82   Total Bilirubin 0.0 - 1.2 mg/dL 0.4  0.4  0.5     Lab Results  Component Value Date/Time   TSH 0.286 (L) 09/17/2022 10:58 AM   TSH 0.344 (L) 08/18/2022 03:50 PM   FREET4 1.03 08/11/2020 01:46 PM   FREET4 0.96 12/28/2019 10:12 AM       Latest Ref Rng & Units 09/17/2022   10:58 AM 08/18/2022    3:50 PM 06/14/2022   12:04 PM  CBC  WBC 3.4 - 10.8  x10E3/uL 4.4  7.2  6.0   Hemoglobin 11.1 - 15.9 g/dL 13.6  14.4  15.0   Hematocrit 34.0 - 46.6 % 41.0  43.1  46.0   Platelets 150 - 450 x10E3/uL 202  250  194     Lab Results  Component Value Date/Time   VD25OH 49.8 09/17/2022 10:58 AM   VD25OH 17.1 (L) 06/14/2022 12:04 PM    Clinical ASCVD: Yes  The ASCVD Risk score (Arnett DK, et al., 2019) failed to calculate for the following reasons:   The 2019 ASCVD risk score is only valid for ages 69 to 69   The patient has a prior MI or stroke diagnosis    Other: (CHADS2VASc if Afib, MMRC or CAT for COPD, ACT, DEXA)     09/17/2022   10:39 AM 02/18/2022    1:20 PM 08/20/2021   10:18 AM  Depression screen PHQ 2/9  Decreased Interest 3 0 0  Down, Depressed, Hopeless 2 0 0  PHQ - 2 Score 5 0  0  Altered sleeping 3    Tired, decreased energy 3    Change in appetite 2    Feeling bad or failure about yourself  0    Trouble concentrating 1    Moving slowly or fidgety/restless 1    Suicidal thoughts 0    PHQ-9 Score 15    Difficult doing work/chores Somewhat difficult       Social History   Tobacco Use  Smoking Status Never   Passive exposure: Never  Smokeless Tobacco Never   BP Readings from Last 3 Encounters:  10/12/22 120/72  09/17/22 122/70  08/18/22 110/78   Pulse Readings from Last 3 Encounters:  10/12/22 (!) 56  09/17/22 (!) 51  08/18/22 60   Wt Readings from Last 3 Encounters:  10/12/22 185 lb (83.9 kg)  09/17/22 184 lb (83.5 kg)  08/18/22 186 lb 12.8 oz (84.7 kg)   BMI Readings from Last 3 Encounters:  10/12/22 37.37 kg/m  09/17/22 37.16 kg/m  08/18/22 37.73 kg/m    Allergies  Allergen Reactions   Sulfa Antibiotics Hives   Metformin And Related Nausea And Vomiting    Hallucinations and bring her down.    Medications Reviewed Today     Reviewed by Lane Hacker, [facility] (Pharmacist) on 10/19/22 at 1133  Med List Status: <None>   Medication Order Taking? Sig Documenting Provider Last Dose Status Informant  acetaminophen (TYLENOL) 500 MG tablet [id number]  Take 500 mg by mouth every 6 (six) hours as needed for mild pain or moderate pain. Takes  as needed for pain [provider]  Active   alendronate (FOSAMAX) 70 MG tablet [id number]  Take 1 tablet (70 mg total) by mouth every 7 (seven) days. Take with a full glass of water on an empty stomach. Marge Duncans, PA-C  Active   amLODipine (NORVASC) 5 MG tablet [id number]  Take 1 tablet (5 mg total) by mouth daily. Park Liter, MD  Active   aspirin EC 81 MG tablet [id number]  Take 81 mg by mouth daily. Swallow whole. [provider]  Active   blood glucose meter kit and supplies KIT [id number]  Dispense based on patient and insurance preference. Use up to four times daily as  directed. Marge Duncans, PA-C  Active   Blood Glucose Monitoring Suppl [facility] VERIO FLEX SYSTEM) w/Device Drucie Opitz [id number]  Check Glucose fasting and 2 hours after meals Marge Duncans, PA-C  Active   carvedilol (COREG) 12.5 MG tablet  [id number]  TAKE ONE TABLET BY MOUTH TWICE DAILY WITH A MEAL Marge Duncans, PA-C  Active   glucose blood [facility] VERIO) test strip [id number]  Use as instructed Marge Duncans, PA-C  Active   Lancets [facility] ULTRASOFT) lancets [id number]  Use as instructed Marge Duncans, PA-C  Active   meclizine (ANTIVERT) 12.5 MG tablet [id number]  TAKE ONE TABLET BY MOUTH three times daily AS NEEDED FOR dizziness Marge Duncans, PA-C  Active   nitroGLYCERIN (NITROSTAT) 0.4 MG SL tablet [id number]  Place 1 tablet (0.4 mg total) under the tongue every 5 (five) minutes as needed for chest pain. Tobb, Kardie, DO  Expired 09/17/22 2359   pravastatin (PRAVACHOL) 80 MG tablet [id number] Yes Take 1 tablet (80 mg total) by mouth every evening.  Patient taking differently: Take 40 mg by mouth every evening.   Park Liter, MD Taking Active   Semaglutide [facility]) 3 MG TABS [id number]  Take 3 mg by mouth daily. Marge Duncans, PA-C  Active   traMADol (ULTRAM) 50 MG tablet [id number]  Take 50 mg by mouth 3 (three) times daily as needed. [provider]  Active   Vitamin D, Ergocalciferol, (DRISDOL) 1.25 MG (50000 UNIT) CAPS capsule [id number]  Take one pill twice weekly Cox, Kirsten, MD  Active             SDOH:  (Social Determinants of Health) assessments and interventions performed: Yes SDOH Interventions    [place] Coordination from 10/19/2022 in [place] Office Visit from 09/17/2022 in [place] Telephone from 08/11/2022 in [place] from 02/18/2022 in [place] Management from 08/20/2021 in [place] Management from 07/30/2021 in [place] Interventions        Food Insecurity Interventions -- -- Intervention Not Indicated Intervention Not Indicated -- Intervention Not Indicated  Housing Interventions -- -- Intervention Not Indicated Intervention Not Indicated  [son and daughter are helping her look for new place to [place] -- Other (Comment)  [Referral to [place] and Housing Resources Provided.]  Transportation Interventions Intervention Not Indicated -- Intervention Not Indicated  [daughter provides transportation] Intervention Not Indicated -- Financial planner  Depression Interventions/Treatment  -- Counseling -- -- -- --  Museum/gallery curator Strain Interventions -- -- -- -- -- Other (Comment)  [Referral to Community Care Guides.]  Physical Activity Interventions Patient Refused -- -- -- Other (Comments)  [severe pain and patient reports she can not tolerate exercise] Patient Refused  Stress Interventions -- -- -- -- -- Offered [employer], Provide Counseling, Patient Refused  Social Connections Interventions -- -- -- Intervention Not Indicated -- Patient Refused      SDOH Screenings   Food Insecurity: No Food Insecurity (08/11/2022)  Housing: Low Risk  (08/11/2022)  Transportation Needs: No Transportation Needs (10/19/2022)  Alcohol Screen: Low Risk  (02/18/2022)  Depression (PHQ2-9): High Risk (09/17/2022)  Financial Resource Strain: Medium Risk (11/23/2021)  Physical Activity: Inactive (10/19/2022)  Social Connections: Socially Isolated (02/18/2022)  Stress: No Stress Concern Present (07/30/2021)  Tobacco Use: Low Risk  (10/12/2022)    Medication Assistance: None required.  Patient affirms current coverage meets needs.   [facility] Reviewed: Is patient disadvantaged to use [facility]?: No  Name and location of Current pharmacy:  [facility] - [place], [place] - [address] [place] [address] Phone:  [phone] Fax: [phone]  Compliance/Adherence/Medication fill history: Care Gaps: Dexa scan overdue  Tdap overdue Uacr overdue   Star Rating Drugs: Pravastatin 80 mg- Last filled 08-20-2022 30 DS. Previous 07-23-2022 30 DS Metformin 500 mg- Last filled 08-20-2022 30 DS. Previous 07-23-2022 30 DS.   Assessment/Plan   Hypertension (BP goal <140/90) BP Readings from Last 3 Encounters:  10/12/22 120/72  09/17/22 122/70  08/18/22 110/78   -Uncontrolled -Current treatment: Amlodpine 5mg  Appropriate, Effective, Safe, Accessible -Medications previously tried: Metoprolol 25mg  (take 1/2)  -Current home readings: Jan 2024 127/74 -Current dietary habits: "Tries to eat healthy" -Current exercise habits: None, tired just standing -Denies hypotensive/hypertensive symptoms -Educated on Symptoms of hypotension and importance of maintaining adequate hydration; -Counseled to monitor BP at home weekly, document, and provide log at future appointments November 2022: Patient does not like taking medicine. She was told, "Take this BP med and it'll slow your heart down." She got scared because she thought, "How does it know to stop, what if it slows down too much" and she never took it. Spent majority of visit talking about hypotension and S/S   CAD: (LDL goal < 100)  The ASCVD Risk score (Arnett DK, et al., 2019) failed to calculate for the following reasons:   The 2019 ASCVD risk score is only valid for ages 60 to 10   The patient has a prior MI or stroke diagnosis Lab Results  Component Value Date   CHOL 107 09/17/2022   CHOL 185 06/14/2022   CHOL 169 03/10/2022   Lab Results  Component Value Date   HDL 39 (L) 09/17/2022   HDL 46 06/14/2022   HDL 39 (L) 03/10/2022   Lab Results  Component Value Date   LDLCALC 45 09/17/2022   LDLCALC 104 (H) 06/14/2022   LDLCALC 97 03/10/2022   Lab Results  Component Value Date   TRIG 133 09/17/2022   TRIG 204 (H) 06/14/2022   TRIG  191 (H) 03/10/2022   Lab Results  Component Value Date   CHOLHDL 2.7 09/17/2022   CHOLHDL 4.0 06/14/2022   CHOLHDL 4.3 03/10/2022   No results found for: "LDLDIRECT" Last vitamin D Lab Results  Component Value Date   VD25OH 49.8 09/17/2022   Lab Results  Component Value Date   TSH 0.286 (L) 09/17/2022  -Controlled -Current treatment: Pravastatin 80mg  Appropriate, Effective, Safe, Accessible ASA 81mg  Appropriate, Effective, Safe, Accessible -Medications previously tried: N/A  -Current dietary habits: "Tries to eat healthy" -Current exercise habits: None, tired just standing -Educated on Cholesterol goals;  Feb 2023: Convinced patient to take ASA 81mg  vs 325mg  per Cardio note from Jan 2023  Osteoporosis / Osteopenia (Goal Prevent fractures) -Controlled -Last DEXA Scan:   T-Score femoral neck Left: 08/20/22: -1.5 01/05/13: -1.1 T-Score total mean: 08/20/22: -0.5 01/05/13: 0.1 T-Score forearm radius:  08/20/22: -3.3 10-year probability of major osteoporotic fracture:  10/19/22: 12% 10-year probability of hip fracture:  10/19/22: 2.9% -Patient is a candidate for pharmacologic treatment due to T-Score -Current treatment  Alendronate 70mg  Appropriate, Effective, Safe, Accessible Start Date: Jan 2024 -Medications previously tried: N/A  -Recommend [phone] units of vitamin D daily. Recommend 1200 mg of calcium daily from dietary and supplemental sources. -Recommended to continue current medication   CPP F/U PRN  08/22/22, Pharm.D. - [phone]

## 2022-11-12 ENCOUNTER — Other Ambulatory Visit: Payer: Self-pay | Admitting: Physician Assistant

## 2022-11-12 ENCOUNTER — Other Ambulatory Visit: Payer: Self-pay

## 2022-11-12 ENCOUNTER — Telehealth: Payer: Self-pay

## 2022-11-12 DIAGNOSIS — E1165 Type 2 diabetes mellitus with hyperglycemia: Secondary | ICD-10-CM

## 2022-11-12 MED ORDER — RYBELSUS 7 MG PO TABS: 7.0000 mg | ORAL_TABLET | Freq: Every day | ORAL | 0 refills | Status: DC

## 2022-11-12 NOTE — Telephone Encounter (Signed)
Please call pt and let her know rybelsus dosing needs to be increased now from 12m to 7 mg (that is guideline to therapy ) - will send in new rx

## 2022-11-12 NOTE — Progress Notes (Signed)
Care Management & Coordination Services Pharmacy Team  Reason for Encounter: Medication coordination and delivery  Contacted patient to discuss medications and coordinate [facility]. Spoke with patient on 11/12/2022  Cycle dispensing form sent to [place] [facility] for review.   Last adherence delivery date: 10-26-2022   Patient is due for next adherence delivery on: 11-24-2022  This delivery to include: Vials  30 Days  Aspirin 64m-1 tab once daily             Meclizine 12.565m1 tab three times daily as needed  Pravastatin 8024m tab once daily      Amlodipine 5mg51mtab once daily        Carvedilol 12.5mg-24mab twice daily     Vitamin D2 50,000 unit- 1 capsule twice weekly Rybelsus 3 mg daily Alendronate 70 mg take every 7 days Strips lancets  Patient declined the following medications this month: Tylenol- OTC   Nitroglycerin 0.4-Does not need, only as prn   Refills requested from providers include: Rybelsus   Confirmed delivery date of 11-24-2022, advised patient that pharmacy will contact them the morning of delivery.   Any concerns about your medications? No  How often do you forget or accidentally miss a dose? Never  Do you use a pillbox? No  Is patient in packaging No  Recent blood pressure readings are as follows: 140/80, 127/74  Recent blood glucose readings are as follows: patient stated she was out and could only remember blood pressure readings.   Chart review: Recent office visits:  None  Recent consult visits:  None  Hospital visits:  None in previous 6 months  Medications: Outpatient Encounter Medications as of 11/12/2022  Medication Sig   alendronate (FOSAMAX) 70 MG tablet Take 1 tablet (70 mg total) by mouth every 7 (seven) days. Take with a full glass of water on an empty stomach.   acetaminophen (TYLENOL) 500 MG tablet Take 500 mg by mouth every 6 (six) hours as needed for mild pain or moderate pain. Takes  as needed for  pain   amLODipine (NORVASC) 5 MG tablet Take 1 tablet (5 mg total) by mouth daily.   aspirin EC 81 MG tablet Take 81 mg by mouth daily. Swallow whole.   blood glucose meter kit and supplies KIT Dispense based on patient and insurance preference. Use up to four times daily as directed.   Blood Glucose Monitoring Suppl (ONETOUCH VERIO FLEX SYSTEM) w/Device KIT Check Glucose fasting and 2 hours after meals   carvedilol (COREG) 12.5 MG tablet TAKE ONE TABLET BY MOUTH TWICE DAILY WITH A MEAL   glucose blood (ONETOUCH VERIO) test strip Use as instructed   Lancets (ONETOUCH ULTRASOFT) lancets Use as instructed   meclizine (ANTIVERT) 12.5 MG tablet TAKE ONE TABLET BY MOUTH three times daily AS NEEDED FOR dizziness   nitroGLYCERIN (NITROSTAT) 0.4 MG SL tablet Place 1 tablet (0.4 mg total) under the tongue every 5 (five) minutes as needed for chest pain.   pravastatin (PRAVACHOL) 80 MG tablet Take 1 tablet (80 mg total) by mouth every evening. (Patient taking differently: Take 40 mg by mouth every evening.)   Semaglutide (RYBELSUS) 3 MG TABS Take 3 mg by mouth daily.   traMADol (ULTRAM) 50 MG tablet Take 50 mg by mouth 3 (three) times daily as needed.   Vitamin D, Ergocalciferol, (DRISDOL) 1.25 MG (50000 UNIT) CAPS capsule Take one pill twice weekly   No facility-administered encounter medications on file as of 11/12/2022.   BP Readings from  Last 3 Encounters:  10/12/22 120/72  09/17/22 122/70  08/18/22 110/78    Pulse Readings from Last 3 Encounters:  10/12/22 (!) 56  09/17/22 (!) 51  08/18/22 60    Lab Results  Component Value Date/Time   HGBA1C 7.2 (H) 09/17/2022 10:58 AM   HGBA1C 7.2 (H) 06/14/2022 12:04 PM   Lab Results  Component Value Date   CREATININE 0.59 09/17/2022   BUN 18 09/17/2022   GFRNONAA 84 07/31/2020   GFRAA 97 07/31/2020   NA 141 09/17/2022   K 4.1 09/17/2022   CALCIUM 9.2 09/17/2022   CO2 21 09/17/2022   [place] Clinical Pharmacist  Assistant [phone]

## 2022-11-12 NOTE — Telephone Encounter (Signed)
Patient was informed that Marge Duncans sent rybelsus 7 mg daily.

## 2022-11-15 NOTE — Telephone Encounter (Signed)
Epic said patient taking half of Pravastatin 35m. Called patient who affirmed taking whole. Updated list

## 2022-12-14 ENCOUNTER — Telehealth: Payer: Self-pay

## 2022-12-14 NOTE — Progress Notes (Signed)
Care Management & Coordination Services Pharmacy Team  Reason for Encounter: Medication coordination and delivery  Contacted patient to discuss medications and coordinate delivery from [facility]. Spoke with patient on 12/14/2022  Cycle dispensing form sent to Pattricia Boss for review.   Last adherence delivery date: 11-24-2022  Patient is due for next adherence delivery on: 12-24-2022  This delivery to include: Vials  30 Days  Aspirin '81mg'$ -1 tab once daily             Pravastatin '80mg'$ -1 tab once daily      Amlodipine '5mg'$ -1 tab once daily        Carvedilol 12.'5mg'$ -1 tab twice daily     Vitamin D2 50,000 unit- 1 capsule twice weekly Rybelsus 7 mg daily Alendronate 70 mg take every 7 days Strips lancets  Patient declined the following medications this month: Tylenol- OTC   Nitroglycerin 0.4-Does not need, only as prn  Tramadol- temporarily use  No refill request needed.  Confirmed delivery date of 12-24-2022, advised patient that pharmacy will contact them the morning of delivery.   Any concerns about your medications? No  How often do you forget or accidentally miss a dose? Never  Do you use a pillbox? No  Is patient in packaging No  Recent blood pressure readings are as follows: 130/74, 132/72  Recent blood glucose readings are as follows: 124, 130   Chart review: Recent office visits:  None  Recent consult visits:  None  Hospital visits:  None in previous 6 months  Medications: Outpatient Encounter Medications as of 12/14/2022  Medication Sig   acetaminophen (TYLENOL) 500 MG tablet Take 500 mg by mouth every 6 (six) hours as needed for mild pain or moderate pain. Takes  as needed for pain   alendronate (FOSAMAX) 70 MG tablet Take 1 tablet (70 mg total) by mouth every 7 (seven) days. Take with a full glass of water on an empty stomach.   amLODipine (NORVASC) 5 MG tablet Take 1 tablet (5 mg total) by mouth daily.   aspirin EC 81 MG tablet Take 81 mg  by mouth daily. Swallow whole.   blood glucose meter kit and supplies KIT Dispense based on patient and insurance preference. Use up to four times daily as directed.   Blood Glucose Monitoring Suppl (ONETOUCH VERIO FLEX SYSTEM) w/Device KIT Check Glucose fasting and 2 hours after meals   carvedilol (COREG) 12.5 MG tablet TAKE ONE TABLET BY MOUTH TWICE DAILY WITH A MEAL   glucose blood (ONETOUCH VERIO) test strip Use as instructed   Lancets (ONETOUCH ULTRASOFT) lancets Use as instructed   meclizine (ANTIVERT) 12.5 MG tablet TAKE ONE TABLET BY MOUTH three times daily AS NEEDED FOR dizziness   nitroGLYCERIN (NITROSTAT) 0.4 MG SL tablet Place 1 tablet (0.4 mg total) under the tongue every 5 (five) minutes as needed for chest pain.   pravastatin (PRAVACHOL) 80 MG tablet Take 1 tablet (80 mg total) by mouth every evening.   Semaglutide (RYBELSUS) 7 MG TABS Take 1 tablet (7 mg total) by mouth daily.   traMADol (ULTRAM) 50 MG tablet Take 50 mg by mouth 3 (three) times daily as needed.   Vitamin D, Ergocalciferol, (DRISDOL) 1.25 MG (50000 UNIT) CAPS capsule Take one pill twice weekly   No facility-administered encounter medications on file as of 12/14/2022.   BP Readings from Last 3 Encounters:  10/12/22 120/72  09/17/22 122/70  08/18/22 110/78    Pulse Readings from Last 3 Encounters:  10/12/22 (!) 56  09/17/22 (!)  51  08/18/22 60    Lab Results  Component Value Date/Time   HGBA1C 7.2 (H) 09/17/2022 10:58 AM   HGBA1C 7.2 (H) 06/14/2022 12:04 PM   Lab Results  Component Value Date   CREATININE 0.59 09/17/2022   BUN 18 09/17/2022   GFRNONAA 84 07/31/2020   GFRAA 97 07/31/2020   NA 141 09/17/2022   K 4.1 09/17/2022   CALCIUM 9.2 09/17/2022   CO2 21 09/17/2022     [facility] Clinical Pharmacist Assistant [phone]

## 2023-01-10 ENCOUNTER — Other Ambulatory Visit: Payer: Self-pay | Admitting: Physician Assistant

## 2023-01-10 DIAGNOSIS — I2510 Atherosclerotic heart disease of native coronary artery without angina pectoris: Secondary | ICD-10-CM

## 2023-01-10 DIAGNOSIS — I10 Essential (primary) hypertension: Secondary | ICD-10-CM

## 2023-01-12 ENCOUNTER — Telehealth: Payer: Self-pay

## 2023-01-12 NOTE — Progress Notes (Signed)
Care Management & Coordination Services Pharmacy Team  Reason for Encounter: Medication coordination and delivery  Contacted patient to discuss medications and coordinate delivery from [facility]. Spoke with patient on 01/12/2023  Cycle dispensing form sent to Billee Cashing for review.   Last adherence delivery date: 12-24-2022   Patient is due for next adherence delivery on: 01-24-2023  This delivery to include: Vials  30 Days  Aspirin 81mg -1 tab once daily             Pravastatin 80mg -1 tab once daily      Amlodipine 5mg -1 tab once daily        Carvedilol 12.5mg -1 tab twice daily     Vitamin D2 50,000 unit- 1 capsule twice weekly Rybelsus 7 mg daily Alendronate 70 mg take every 7 days Strips lancets  Patient declined the following medications this month: Tylenol- OTC   Nitroglycerin 0.4-Does not need, only as prn  Tramadol- temporarily use Meclizine- [facility] supply PRN  No refill request needed.  Confirmed delivery date of 01-24-2023, advised patient that pharmacy will contact them the morning of delivery.   Any concerns about your medications? No  How often do you forget or accidentally miss a dose? Never  Do you use a pillbox? No  Is patient in packaging No  If yes  What is the date on your next pill pack?  Any concerns or issues with your packaging?   Recent blood pressure readings are as follows: Patient stated none available at the moment due to traveling  Recent blood glucose readings are as follows: Patient stated none available at the moment due to traveling   Chart review: Recent office visits:  None  Recent consult visits:  None  Hospital visits:  None in previous 6 months  Medications: Outpatient Encounter Medications as of 01/12/2023  Medication Sig   acetaminophen (TYLENOL) 500 MG tablet Take 500 mg by mouth every 6 (six) hours as needed for mild pain or moderate pain. Takes  as needed for pain   alendronate (FOSAMAX) 70 MG tablet  Take 1 tablet (70 mg total) by mouth every 7 (seven) days. Take with a full glass of water on an empty stomach.   amLODipine (NORVASC) 5 MG tablet Take 1 tablet (5 mg total) by mouth daily.   aspirin EC 81 MG tablet Take 81 mg by mouth daily. Swallow whole.   blood glucose meter kit and supplies KIT Dispense based on patient and insurance preference. Use up to four times daily as directed.   Blood Glucose Monitoring Suppl (ONETOUCH VERIO FLEX SYSTEM) w/Device KIT Check Glucose fasting and 2 hours after meals   carvedilol (COREG) 12.5 MG tablet TAKE ONE TABLET BY MOUTH TWICE DAILY WITH A MEAL   glucose blood (ONETOUCH VERIO) test strip Use as instructed   Lancets (ONETOUCH ULTRASOFT) lancets Use as instructed   meclizine (ANTIVERT) 12.5 MG tablet TAKE ONE TABLET BY MOUTH three times daily AS NEEDED FOR dizziness   nitroGLYCERIN (NITROSTAT) 0.4 MG SL tablet Place 1 tablet (0.4 mg total) under the tongue every 5 (five) minutes as needed for chest pain.   pravastatin (PRAVACHOL) 80 MG tablet Take 1 tablet (80 mg total) by mouth every evening.   Semaglutide (RYBELSUS) 7 MG TABS Take 1 tablet (7 mg total) by mouth daily.   traMADol (ULTRAM) 50 MG tablet Take 50 mg by mouth 3 (three) times daily as needed.   Vitamin D, Ergocalciferol, (DRISDOL) 1.25 MG (50000 UNIT) CAPS capsule Take one pill twice weekly  No facility-administered encounter medications on file as of 01/12/2023.   BP Readings from Last 3 Encounters:  10/12/22 120/72  09/17/22 122/70  08/18/22 110/78    Pulse Readings from Last 3 Encounters:  10/12/22 (!) 56  09/17/22 (!) 51  08/18/22 60    Lab Results  Component Value Date/Time   HGBA1C 7.2 (H) 09/17/2022 10:58 AM   HGBA1C 7.2 (H) 06/14/2022 12:04 PM   Lab Results  Component Value Date   CREATININE 0.59 09/17/2022   BUN 18 09/17/2022   GFRNONAA 84 07/31/2020   GFRAA 97 07/31/2020   NA 141 09/17/2022   K 4.1 09/17/2022   CALCIUM 9.2 09/17/2022   CO2 21 09/17/2022      Malecca Hicks CMA Clinical Pharmacist Assistant [phone]

## 2023-01-17 ENCOUNTER — Encounter: Payer: Self-pay | Admitting: Physician Assistant

## 2023-01-17 ENCOUNTER — Ambulatory Visit (INDEPENDENT_AMBULATORY_CARE_PROVIDER_SITE_OTHER): Payer: Medicare HMO | Admitting: Physician Assistant

## 2023-01-17 VITALS — BP 120/70 | HR 67 | Temp 97.4°F | Ht 59.0 in | Wt 168.6 lb

## 2023-01-17 DIAGNOSIS — E042 Nontoxic multinodular goiter: Secondary | ICD-10-CM

## 2023-01-17 DIAGNOSIS — I10 Essential (primary) hypertension: Secondary | ICD-10-CM

## 2023-01-17 DIAGNOSIS — R197 Diarrhea, unspecified: Secondary | ICD-10-CM

## 2023-01-17 DIAGNOSIS — E782 Mixed hyperlipidemia: Secondary | ICD-10-CM

## 2023-01-17 DIAGNOSIS — I2510 Atherosclerotic heart disease of native coronary artery without angina pectoris: Secondary | ICD-10-CM

## 2023-01-17 DIAGNOSIS — R1030 Lower abdominal pain, unspecified: Secondary | ICD-10-CM | POA: Diagnosis not present

## 2023-01-17 DIAGNOSIS — R0789 Other chest pain: Secondary | ICD-10-CM | POA: Diagnosis not present

## 2023-01-17 DIAGNOSIS — R634 Abnormal weight loss: Secondary | ICD-10-CM | POA: Diagnosis not present

## 2023-01-17 DIAGNOSIS — E1165 Type 2 diabetes mellitus with hyperglycemia: Secondary | ICD-10-CM | POA: Diagnosis not present

## 2023-01-17 DIAGNOSIS — R112 Nausea with vomiting, unspecified: Secondary | ICD-10-CM | POA: Diagnosis not present

## 2023-01-17 DIAGNOSIS — E559 Vitamin D deficiency, unspecified: Secondary | ICD-10-CM

## 2023-01-17 NOTE — Progress Notes (Signed)
Subjective:  Patient ID: [name], female    DOB: 09/05/1941  Age: 82 y.o. MRN: [id number]  Chief Complaint  Patient presents with   Hypertension   Diabetes    Diabetes  Hyperlipidemia  Hypertension    Pt presents for follow up of hypertension. Patient was diagnosed in 2010 The patient is tolerating the medication well without side effects. Compliance with treatment has been good; including taking medication as directed , maintains a healthy diet and  and following up as directed.currently taking norvasc  qd and coreg 12.5mg  bid Pt also with history of PAT and CAD - pt had a follow up with cardiology a few months ago and will be seen on a yearly basis because she told that office she was asymptomatic and need to further evaluate CAD was deferred HOWEVER pt states that actually over the past year she has intermittent chest pains that can occur at rest or with activity - she did not tell cardiologist she was having these - recommend we contact cardiology for her to have follow up appt to discuss/evaluate before next appt which is not until January She states some of the pains she has had she considered taking her nitro but never did  Mixed hyperlipidemia  Pt presents with hyperlipidemia.  Compliance with treatment has been good The patient is compliant with medications, maintains a low cholesterol diet , follows up as directed ,  . The patient denies experiencing any hypercholesterolemia related symptoms. She is currently on pravachol  qd  Pt with history of vitamin D def - currently on weekly supplement - due for labwork  Pt with history of cervical spondylosis with radiculopathy, chronic low back pain with sciatica and history of chronic knee pain with knee replacement - pt states that she has not followed up yet with ortho   Pt with diabetes.  She was started on metformin but states she has stopped the medication due to side effects of malaise and nausea.  She currently is  taking rybelsus which she states she has done well on - she cannot recall what her current glucose average readings have been but assures me they have been under 150  Pt states for the past month she has had nausea and vomiting with every meal, diarrhea with every meal and has noted weight loss.  Since her last weight taken in January she has lost almost 20 pounds.  Pt denies melena/hematochezia.  She has history of diverticulosis in the past.  Has not seen GI since moving here from [place] Pt does not have gallbladder She has intermittent stomach pains.  Of note I did pull old CT abdomen from ED in 11/23 - no acute findings HOWEVER - it was noted incidentally she had thyroid nodules and thyroid ultrasound was recommended - pt and daughter state they did not know about this finding  Current Outpatient Medications on File Prior to Visit  Medication Sig Dispense Refill   alendronate (FOSAMAX) 70 MG tablet Take 1 tablet (70 mg total) by mouth every 7 (seven) days. Take with a full glass of water on an empty stomach. 4 tablet 11   amLODipine (NORVASC) 5 MG tablet Take 1 tablet (5 mg total) by mouth daily. 90 tablet 1   aspirin EC 81 MG tablet Take 81 mg by mouth daily. Swallow whole.     blood glucose meter kit and supplies KIT Dispense based on patient and insurance preference. Use up to four times daily as directed. 1 each  0   Blood Glucose Monitoring Suppl (ONETOUCH VERIO FLEX SYSTEM) w/Device KIT Check Glucose fasting and 2 hours after meals 1 kit 0   carvedilol (COREG) 12.5 MG tablet TAKE ONE TABLET BY MOUTH TWICE DAILY WITH A MEAL 60 tablet 0   glucose blood (ONETOUCH VERIO) test strip Use as instructed 100 each 12   Lancets (ONETOUCH ULTRASOFT) lancets Use as instructed 100 each 12   meclizine (ANTIVERT) 12.5 MG tablet TAKE ONE TABLET BY MOUTH three times daily AS NEEDED FOR dizziness 30 tablet 2   pravastatin (PRAVACHOL) 80 MG tablet Take 1 tablet (80 mg total) by mouth every evening. 90  tablet 1   Semaglutide (RYBELSUS) 7 MG TABS Take 1 tablet (7 mg total) by mouth daily. 90 tablet 0   traMADol (ULTRAM) 50 MG tablet Take 50 mg by mouth 3 (three) times daily as needed.     Vitamin D, Ergocalciferol, (DRISDOL) 1.25 MG (50000 UNIT) CAPS capsule Take one pill twice weekly 8 capsule 5   nitroGLYCERIN (NITROSTAT) 0.4 MG SL tablet Place 1 tablet (0.4 mg total) under the tongue every 5 (five) minutes as needed for chest pain. 90 tablet 3   No current facility-administered medications on file prior to visit.   Past Medical History:  Diagnosis Date   Acute respiratory disease due to COVID-19 virus 08/12/2019   BMI 35.0-35.9,adult 03/31/2020   Chest pain, precordial 04/10/2020   Chronic bilateral low back pain with bilateral sciatica 09/15/2021   Last Assessment & Plan:  Formatting of this note might be different from the original. 82 year old female seen today in initial consultation for her chronic back pain with bilateral lower extremity radiculopathy, left greater than right.    Will request previous lumbar spine MRI for review.  At this time, I will start patient on gabapentin 100 mg q.h.s. x2 weeks then increase her to 200 mg q.h.s.    Coronary artery disease involving native coronary artery of native heart without angina pectoris 05/10/2016   Apparently coronary intervention done in 1993 in [place] She was fine to have 75% narrowing of the mid LAD, nitroglycerin was given reduction to 30-40% it was also described as bridging in that area   CVA (cerebral vascular accident)    2020   Daytime somnolence 09/15/2020   Elevated blood pressure 09/15/2021   Last Assessment & Plan:  Formatting of this note might be different from the original. Blood pressure elevated in clinic today.  Patient will continue to follow-up with his primary care provider for further management.   Essential hypertension 04/10/2020   History of hepatitis 12/14/2019   Hypovitaminosis D 10/29/2021   Mixed  hyperlipidemia 12/14/2019   Neck pain 09/15/2021   Last Assessment & Plan:  Formatting of this note might be different from the original. Atraumatic, acute onset cervical spine pain and stiffness x 2-3 weeks which has been progressive without improvement.  No associated fevers or chills.  Associated headaches, left shoulder, and left upper extremity pain that radiates down into her hand and fingers. This pain is most likely radicular in nature.      Obesity (BMI 30-39.9) 04/10/2020   Pain medication agreement 09/15/2021   Last Assessment & Plan:  Formatting of this note might be different from the original. Agreement signed and placed in the patient's chart today..  UDS to be collected in compliance with clinic policies and procedures.   Patient did not display any signs of abuse or diversion and has been checked on the [place]  Controlled Substance website.   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 Added automatically from request for surgery [id number] Formatting of this note might be different from the original. Added automatically from request for surgery [id number]   Past Surgical History:  Procedure Laterality Date   CESAREAN SECTION N/A    COLON SURGERY     TOTAL KNEE ARTHROPLASTY Bilateral    2017 and 2019    Family History  Problem Relation Age of Onset   Cancer Mother    Heart attack Mother    Heart attack Father    Cancer Father    Cancer Sister    Cancer Brother    Cancer Daughter    Social History   Socioeconomic History   Marital status: Widowed    Spouse  name: Not on file   Number of children: 11   Years of education: 9   Highest education level: 9th grade  Occupational History   Occupation: Retired  Tobacco Use   Smoking status: Never    Passive exposure: Never   Smokeless tobacco: Never  Vaping Use   Vaping Use: Never used  Substance and Sexual Activity   Alcohol use: Never   Drug use: Never   Sexual activity: Not Currently  Other Topics Concern   Not on file  Social History Narrative   Not on file   Social Determinants of Health   Financial Resource Strain: Medium Risk (11/23/2021)   Overall Financial Resource Strain (CARDIA)    Difficulty of Paying Living Expenses: Somewhat hard  Food Insecurity: No Food Insecurity (08/11/2022)   Hunger Vital Sign    Worried About Running Out of Food in the Last Year: Never true    Ran Out of Food in the Last Year: Never true  Transportation Needs: No Transportation Needs (10/19/2022)   PRAPARE - Administrator, Civil Service (Medical): No    Lack of Transportation (Non-Medical): No  Physical Activity: Inactive (10/19/2022)   Exercise Vital Sign    Days of Exercise per Week: 0 days    Minutes of Exercise per Session: 0 min  Stress: No Stress Concern Present (07/30/2021)   [employer] of Occupational Health - Occupational Stress Questionnaire    Feeling of Stress : Only a little  Social Connections: Socially Isolated (02/18/2022)   Social Connection and Isolation Panel [NHANES]    Frequency of Communication with Friends and Family: Once a week    Frequency of Social Gatherings with Friends and Family: More than three times a week    Attends Religious Services: Never    Database administrator or Organizations: No    Attends Banker Meetings: Never    Marital Status: Widowed    CONSTITUTIONAL: see HPI E/N/T: Negative for ear pain, nasal congestion and sore throat.  CARDIOVASCULAR: see HPI RESPIRATORY: Negative for recent cough and dyspnea.   GASTROINTESTINAL: see HPI MSK: Negative for arthralgias and myalgias.  INTEGUMENTARY: Negative for rash.  NEUROLOGICAL: Negative for dizziness and headaches.  PSYCHIATRIC: Negative for sleep disturbance and to question depression screen.  Negative for depression,  negative for anhedonia.       Objective:  PHYSICAL EXAM:   VS: BP 120/70 (BP Location: Left Arm, Patient Position: Sitting, Cuff Size: Normal)   Pulse 67   Temp (!) 97.4 F (36.3 C) (Temporal)   Ht 4\' 11"  (1.499 m)   Wt 168 lb 9.6 oz (76.5 kg)   SpO2 97%   BMI 34.05 kg/m   GEN: Well nourished, well developed, in no acute distress  Cardiac: RRR; no murmurs, rubs, or gallops,no edema -  Respiratory:  normal respiratory rate and pattern with no distress - normal breath sounds with no rales, rhonchi, wheezes or rubs GI: normal bowel sounds, mild tenderness to lower quadrants but without guarding or rebound MS: no deformity or atrophy  Skin: warm and dry, no rash  Neuro:  Alert and Oriented x 3- CN II-Xii grossly intact Psych: euthymic mood, appropriate affect and demeanor  EKG - no acute changes Lab Results  Component Value Date   WBC 4.4 09/17/2022   HGB 13.6 09/17/2022   HCT 41.0 09/17/2022   PLT 202 09/17/2022   GLUCOSE 169 (H) 09/17/2022   CHOL 107 09/17/2022   TRIG 133 09/17/2022   HDL 39 (L) 09/17/2022   LDLCALC 45 09/17/2022   ALT 40 (H) 09/17/2022   AST 35 09/17/2022   NA 141 09/17/2022   K 4.1 09/17/2022   CL 106 09/17/2022   CREATININE 0.59 09/17/2022   BUN 18 09/17/2022   CO2 21 09/17/2022   TSH 0.286 (L) 09/17/2022   HGBA1C 7.2 (H) 09/17/2022      Assessment & Plan:   Problem List Items Addressed This Visit       Cardiovascular and Mediastinum   Coronary artery disease involving native coronary artery of native heart without angina pectoris   Relevant Orders   Ambulatory referral to Cardiology Continue meds   Essential hypertension   Relevant Orders   CBC with  Differential/Platelet   Comprehensive metabolic panel   TSH Continue meds     Digestive   Nausea and vomiting   Relevant Orders   Amylase   Lipase   Ambulatory referral to Gastroenterology Pt/daughter will call back - she has med at home for nausea but unsure of name     Endocrine   Type 2 diabetes mellitus with hyperglycemia, without long-term current use of insulin - Primary   Relevant Orders   Hemoglobin A1c Continue current meds   Multiple thyroid nodules   Relevant Orders   US THYROID     Other   Mixed hyperlipidemia   Relevant Orders   Lipid panel   Hypovitaminosis D   Relevant Orders   VITAMIN D 25 Hydroxy (Vit-D Deficiency, Fractures)   Diarrhea   Relevant Orders   Cdiff NAA+O+P+Stool Culture   Ambulatory referral to Gastroenterology   Weight loss   Relevant Orders   Ambulatory referral to Gastroenterology   Atypical chest pain   Relevant Orders   EKG 12-Lead (Completed)   Ambulatory referral to Cardiology   Lower abdominal pain   Relevant Orders   CT Abdomen Pelvis W Contrast   Ambulatory referral to Gastroenterology  .  No orders of the defined types were placed in this encounter.   Orders Placed This Encounter  Procedures   Cdiff NAA+O+P+Stool Culture   CT Abdomen Pelvis W Contrast   US THYROID   CBC with Differential/Platelet   Comprehensive metabolic panel   TSH   Lipid panel   Hemoglobin A1c   VITAMIN D  25 Hydroxy (Vit-D Deficiency, Fractures)   Amylase   Lipase   Ambulatory referral to Gastroenterology   Ambulatory referral to Cardiology   EKG 12-Lead     Follow-up: Return in about 4 weeks (around 02/14/2023) for follow up.  An After Visit Summary was printed and given to the patient.  Jettie Pagan [facility] [phone]

## 2023-01-18 ENCOUNTER — Other Ambulatory Visit: Payer: Self-pay | Admitting: Physician Assistant

## 2023-01-18 DIAGNOSIS — R899 Unspecified abnormal finding in specimens from other organs, systems and tissues: Secondary | ICD-10-CM

## 2023-01-18 LAB — LIPID PANEL
Chol/HDL Ratio: 3.1 ratio (ref 0.0–4.4)
Cholesterol, Total: 107 mg/dL (ref 100–199)
HDL: 34 mg/dL — ABNORMAL LOW (ref >39)
LDL Chol Calc (NIH): 50 mg/dL (ref 0–99)
Triglycerides: 128 mg/dL (ref 0–149)
VLDL Cholesterol Cal: 23 mg/dL (ref 5–40)

## 2023-01-18 LAB — TSH: TSH: 0.068 u[IU]/mL — ABNORMAL LOW (ref 0.450–4.500)

## 2023-01-18 LAB — CBC WITH DIFFERENTIAL/PLATELET
Basophils Absolute: 0 10*3/uL (ref 0.0–0.2)
Basos: 1 %
EOS (ABSOLUTE): 0.1 10*3/uL (ref 0.0–0.4)
Eos: 3 %
Hematocrit: 42.9 % (ref 34.0–46.6)
Hemoglobin: 14.5 g/dL (ref 11.1–15.9)
Immature Grans (Abs): 0 10*3/uL (ref 0.0–0.1)
Immature Granulocytes: 0 %
Lymphocytes Absolute: 1.6 10*3/uL (ref 0.7–3.1)
Lymphs: 32 %
MCH: 29.1 pg (ref 26.6–33.0)
MCHC: 33.8 g/dL (ref 31.5–35.7)
MCV: 86 fL (ref 79–97)
Monocytes Absolute: 0.4 10*3/uL (ref 0.1–0.9)
Monocytes: 8 %
Neutrophils Absolute: 2.9 10*3/uL (ref 1.4–7.0)
Neutrophils: 56 %
Platelets: 295 10*3/uL (ref 150–450)
RBC: 4.98 x10E6/uL (ref 3.77–5.28)
RDW: 11.7 % (ref 11.7–15.4)
WBC: 5.1 10*3/uL (ref 3.4–10.8)

## 2023-01-18 LAB — COMPREHENSIVE METABOLIC PANEL
ALT: 25 IU/L (ref 0–32)
AST: 22 IU/L (ref 0–40)
Albumin/Globulin Ratio: 1.7 (ref 1.2–2.2)
Albumin: 4.2 g/dL (ref 3.7–4.7)
Alkaline Phosphatase: 71 IU/L (ref 44–121)
BUN/Creatinine Ratio: 23 (ref 12–28)
BUN: 15 mg/dL (ref 8–27)
Bilirubin Total: 0.7 mg/dL (ref 0.0–1.2)
CO2: 20 mmol/L (ref 20–29)
Calcium: 9.5 mg/dL (ref 8.7–10.3)
Chloride: 106 mmol/L (ref 96–106)
Creatinine, Ser: 0.66 mg/dL (ref 0.57–1.00)
Globulin, Total: 2.5 g/dL (ref 1.5–4.5)
Glucose: 114 mg/dL — ABNORMAL HIGH (ref 70–99)
Potassium: 4.6 mmol/L (ref 3.5–5.2)
Sodium: 141 mmol/L (ref 134–144)
Total Protein: 6.7 g/dL (ref 6.0–8.5)
eGFR: 88 mL/min/{1.73_m2} (ref >59)

## 2023-01-18 LAB — AMYLASE: Amylase: 35 U/L (ref 31–110)

## 2023-01-18 LAB — VITAMIN D 25 HYDROXY (VIT D DEFICIENCY, FRACTURES): Vit D, 25-Hydroxy: 58.5 ng/mL (ref 30.0–100.0)

## 2023-01-18 LAB — LIPASE: Lipase: 41 U/L (ref 14–85)

## 2023-01-18 LAB — HEMOGLOBIN A1C
Est. average glucose Bld gHb Est-mCnc: 123 mg/dL
Hgb A1c MFr Bld: 5.9 % — ABNORMAL HIGH (ref 4.8–5.6)

## 2023-01-18 LAB — CARDIOVASCULAR RISK ASSESSMENT

## 2023-01-21 DIAGNOSIS — E041 Nontoxic single thyroid nodule: Secondary | ICD-10-CM | POA: Diagnosis not present

## 2023-01-21 DIAGNOSIS — N200 Calculus of kidney: Secondary | ICD-10-CM | POA: Diagnosis not present

## 2023-01-21 DIAGNOSIS — N281 Cyst of kidney, acquired: Secondary | ICD-10-CM | POA: Diagnosis not present

## 2023-01-21 DIAGNOSIS — R1030 Lower abdominal pain, unspecified: Secondary | ICD-10-CM | POA: Diagnosis not present

## 2023-01-21 DIAGNOSIS — E042 Nontoxic multinodular goiter: Secondary | ICD-10-CM | POA: Diagnosis not present

## 2023-01-21 LAB — THYROID PANEL
Free Thyroxine Index: 2.7 (ref 1.2–4.9)
T3 Uptake Ratio: 25 % (ref 24–39)
T4, Total: 10.6 ug/dL (ref 4.5–12.0)

## 2023-01-21 LAB — SPECIMEN STATUS REPORT

## 2023-01-24 ENCOUNTER — Other Ambulatory Visit: Payer: Self-pay

## 2023-01-24 ENCOUNTER — Telehealth: Payer: Self-pay

## 2023-01-24 DIAGNOSIS — R1030 Lower abdominal pain, unspecified: Secondary | ICD-10-CM

## 2023-01-24 DIAGNOSIS — E042 Nontoxic multinodular goiter: Secondary | ICD-10-CM

## 2023-01-24 NOTE — Telephone Encounter (Signed)
Called patient to make her aware of her CT abd/pelvis and US of the head/neck. Left message for patient to call office. Results have been scanned.

## 2023-02-08 ENCOUNTER — Other Ambulatory Visit: Payer: Self-pay | Admitting: Cardiology

## 2023-02-11 ENCOUNTER — Encounter: Payer: Self-pay | Admitting: Physician Assistant

## 2023-02-11 ENCOUNTER — Other Ambulatory Visit: Payer: Self-pay

## 2023-02-11 ENCOUNTER — Other Ambulatory Visit: Payer: Self-pay | Admitting: Physician Assistant

## 2023-02-11 ENCOUNTER — Ambulatory Visit (INDEPENDENT_AMBULATORY_CARE_PROVIDER_SITE_OTHER): Payer: Medicare HMO | Admitting: Physician Assistant

## 2023-02-11 ENCOUNTER — Telehealth: Payer: Self-pay

## 2023-02-11 VITALS — BP 110/70 | HR 60 | Temp 96.5°F | Resp 18 | Ht 59.0 in | Wt 167.6 lb

## 2023-02-11 DIAGNOSIS — R634 Abnormal weight loss: Secondary | ICD-10-CM

## 2023-02-11 DIAGNOSIS — R1030 Lower abdominal pain, unspecified: Secondary | ICD-10-CM | POA: Diagnosis not present

## 2023-02-11 DIAGNOSIS — I2510 Atherosclerotic heart disease of native coronary artery without angina pectoris: Secondary | ICD-10-CM

## 2023-02-11 DIAGNOSIS — R197 Diarrhea, unspecified: Secondary | ICD-10-CM

## 2023-02-11 DIAGNOSIS — I10 Essential (primary) hypertension: Secondary | ICD-10-CM

## 2023-02-11 DIAGNOSIS — E1165 Type 2 diabetes mellitus with hyperglycemia: Secondary | ICD-10-CM

## 2023-02-11 MED ORDER — RYBELSUS 7 MG PO TABS: 7.0000 mg | ORAL_TABLET | Freq: Every day | ORAL | 0 refills | Status: DC

## 2023-02-11 MED ORDER — CARVEDILOL 12.5 MG PO TABS
12.5000 mg | ORAL_TABLET | Freq: Two times a day (BID) | ORAL | 0 refills | Status: DC
Start: 2023-02-11 — End: 2023-03-14

## 2023-02-11 MED ORDER — ONDANSETRON HCL 4 MG PO TABS: 4.0000 mg | ORAL_TABLET | Freq: Three times a day (TID) | ORAL | 0 refills | Status: AC | PRN

## 2023-02-11 NOTE — Telephone Encounter (Signed)
Compliant on meds 

## 2023-02-11 NOTE — Progress Notes (Signed)
Care Management & Coordination Services Pharmacy Team  Reason for Encounter: Medication coordination and delivery  Contacted patient to discuss medications and coordinate delivery from [facility]. Spoke with patient on 02/11/2023  Cycle dispensing form sent to Artelia Laroche for review.   Last adherence delivery date: 01-24-2023  Patient is due for next adherence delivery on: 02-23-2023  This delivery to include: Vials  30 Days  Aspirin 81mg -1 tab once daily             Pravastatin 80mg -1 tab once daily      Amlodipine 5mg -1 tab once daily        Carvedilol 12.5mg -1 tab twice daily     Vitamin D2 50,000 unit- 1 capsule twice weekly Rybelsus 7 mg daily Alendronate 70 mg take every 7 days Strips lancets  Patient declined the following medications this month: Tylenol- OTC   Nitroglycerin 0.4-Does not need, only as prn  Tramadol- temporarily use Meclizine- Plenty supply PRN  Refills requested from providers include: Carvedilol  Rybelsus   Confirmed delivery date of 02-23-2023, advised patient that pharmacy will contact them the morning of delivery.   Any concerns about your medications? No  How often do you forget or accidentally miss a dose? Never  Do you use a pillbox? No  Is patient in packaging No   Recent blood pressure readings are as follows: 120/82, 136/80  Recent blood glucose readings are as follows: 120, 118   Chart review: Recent office visits:  01-17-2023 Marianne Sofia, PA-C. CT abdomen pelvis w/ contrast and US Thyroid ordered. Referrals placed for [facility] and cardiology.   Recent consult visits:  None  Hospital visits:  None in previous 6 months  Medications: Outpatient Encounter Medications as of 02/11/2023  Medication Sig   alendronate (FOSAMAX) 70 MG tablet Take 1 tablet (70 mg total) by mouth every 7 (seven) days. Take with a full glass of water on an empty stomach.   amLODipine (NORVASC) 5 MG tablet Take 1 tablet (5 mg total) by mouth  daily.   aspirin EC 81 MG tablet Take 81 mg by mouth daily. Swallow whole.   blood glucose meter kit and supplies KIT Dispense based on patient and insurance preference. Use up to four times daily as directed.   Blood Glucose Monitoring Suppl (ONETOUCH VERIO FLEX SYSTEM) w/Device KIT Check Glucose fasting and 2 hours after meals   carvedilol (COREG) 12.5 MG tablet TAKE ONE TABLET BY MOUTH TWICE DAILY WITH A MEAL   glucose blood (ONETOUCH VERIO) test strip Use as instructed   Lancets (ONETOUCH ULTRASOFT) lancets Use as instructed   meclizine (ANTIVERT) 12.5 MG tablet TAKE ONE TABLET BY MOUTH three times daily AS NEEDED FOR dizziness   nitroGLYCERIN (NITROSTAT) 0.4 MG SL tablet Place 1 tablet (0.4 mg total) under the tongue every 5 (five) minutes as needed for chest pain.   pravastatin (PRAVACHOL) 80 MG tablet Take 1 tablet (80 mg total) by mouth every evening.   Semaglutide (RYBELSUS) 7 MG TABS Take 1 tablet (7 mg total) by mouth daily.   traMADol (ULTRAM) 50 MG tablet Take 50 mg by mouth 3 (three) times daily as needed.   Vitamin D, Ergocalciferol, (DRISDOL) 1.25 MG (50000 UNIT) CAPS capsule Take one pill twice weekly   No facility-administered encounter medications on file as of 02/11/2023.   BP Readings from Last 3 Encounters:  01/17/23 120/70  10/12/22 120/72  09/17/22 122/70    Pulse Readings from Last 3 Encounters:  01/17/23 67  10/12/22 (!) 56  09/17/22 (!) 51    Lab Results  Component Value Date/Time   HGBA1C 5.9 (H) 01/17/2023 11:21 AM   HGBA1C 7.2 (H) 09/17/2022 10:58 AM   Lab Results  Component Value Date   CREATININE 0.66 01/17/2023   BUN 15 01/17/2023   GFRNONAA 84 07/31/2020   GFRAA 97 07/31/2020   NA 141 01/17/2023   K 4.6 01/17/2023   CALCIUM 9.5 01/17/2023   CO2 20 01/17/2023    Malecca Hicks CMA Clinical Pharmacist Assistant [phone]

## 2023-02-11 NOTE — Progress Notes (Deleted)
Acute Office Visit  Subjective:    Patient ID: Tina Perkins, female    DOB: 02/04/1941, 82 y.o.   MRN: [id number]  No chief complaint on file.   HPI: Patient is in today for follow-up of nausea, vomiting and diarrhea.    Past Medical History:  Diagnosis Date   Acute respiratory disease due to COVID-19 virus 08/12/2019   BMI 35.0-35.9,adult 03/31/2020   Chest pain, precordial 04/10/2020   Chronic bilateral low back pain with bilateral sciatica 09/15/2021   Last Assessment & Plan:  Formatting of this note might be different from the original. 82 year old female seen today in initial consultation for her chronic back pain with bilateral lower extremity radiculopathy, left greater than right.    Will request previous lumbar spine MRI for review.  At this time, I will start patient on gabapentin 100 mg q.h.s. x2 weeks then increase her to 200 mg q.h.s.    Coronary artery disease involving native coronary artery of native heart without angina pectoris 05/10/2016   Apparently coronary intervention done in 1993 in [place] She was fine to have 75% narrowing of the mid LAD, nitroglycerin was given reduction to 30-40% it was also described as bridging in that area   CVA (cerebral vascular accident) (HCC)    2020   Daytime somnolence 09/15/2020   Elevated blood pressure 09/15/2021   Last Assessment & Plan:  Formatting of this note might be different from the original. Blood pressure elevated in clinic today.  Patient will continue to follow-up with his primary care provider for further management.   Essential hypertension 04/10/2020   History of hepatitis 12/14/2019   Hypovitaminosis D 10/29/2021   Mixed hyperlipidemia 12/14/2019   Neck pain 09/15/2021   Last Assessment & Plan:  Formatting of this note might be different from the original. Atraumatic, acute onset cervical spine pain and stiffness x 2-3 weeks which has been progressive without improvement.  No associated fevers or chills.   Associated headaches, left shoulder, and left upper extremity pain that radiates down into her hand and fingers. This pain is most likely radicular in nature.      Obesity (BMI 30-39.9) 04/10/2020   Pain medication agreement 09/15/2021   Last Assessment & Plan:  Formatting of this note might be different from the original. Agreement signed and placed in the patient's chart today..  UDS to be collected in compliance with clinic policies and procedures.   Patient did not display any signs of abuse or diversion and has been checked on the [facility] Controlled Substance website.   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 Added automatically from request for surgery [id number] Formatting of this note might be different from the original. Added automatically from request for surgery [id number]    Past Surgical History:  Procedure Laterality Date   CESAREAN SECTION N/A    COLON SURGERY     TOTAL KNEE ARTHROPLASTY Bilateral    2017 and 2019    Family History  Problem Relation Age of Onset   Cancer Mother    Heart attack Mother    Heart  attack Father    Cancer Father    Cancer Sister    Cancer Brother    Cancer Daughter     Social History   Socioeconomic History   Marital status: Widowed    Spouse name: Not on file   Number of children: 11   Years of education: 9   Highest education level: 9th grade  Occupational History   Occupation: Retired  Tobacco Use   Smoking status: Never    Passive exposure: Never   Smokeless tobacco: Never  Vaping Use   Vaping Use: Never used   Substance and Sexual Activity   Alcohol use: Never   Drug use: Never   Sexual activity: Not Currently  Other Topics Concern   Not on file  Social History Narrative   Not on file   Social Determinants of Health   Financial Resource Strain: Medium Risk (11/23/2021)   Overall Financial Resource Strain (CARDIA)    Difficulty of Paying Living Expenses: Somewhat hard  Food Insecurity: No Food Insecurity (08/11/2022)   Hunger Vital Sign    Worried About Running Out of Food in the Last Year: Never true    Ran Out of Food in the Last Year: Never true  Transportation Needs: No Transportation Needs (10/19/2022)   PRAPARE - Administrator, Civil Service (Medical): No    Lack of Transportation (Non-Medical): No  Physical Activity: Inactive (10/19/2022)   Exercise Vital Sign    Days of Exercise per Week: 0 days    Minutes of Exercise per Session: 0 min  Stress: No Stress Concern Present (07/30/2021)   [employer] of Occupational Health - Occupational Stress Questionnaire    Feeling of Stress : Only a little  Social Connections: Socially Isolated (02/18/2022)   Social Connection and Isolation Panel [NHANES]    Frequency of Communication with Friends and Family: Once a week    Frequency of Social Gatherings with Friends and Family: More than three times a week    Attends Religious Services: Never    Database administrator or Organizations: No    Attends Banker Meetings: Never    Marital Status: Widowed  Intimate Partner Violence: Not At Risk (08/20/2021)   Humiliation, Afraid, Rape, and Kick questionnaire    Fear of Current or Ex-Partner: No    Emotionally Abused: No    Physically Abused: No    Sexually Abused: No    Outpatient Medications Prior to Visit  Medication Sig Dispense Refill   alendronate (FOSAMAX) 70 MG tablet Take 1 tablet (70 mg total) by mouth every 7 (seven) days. Take with a full glass of water on an empty stomach. 4 tablet 11   amLODipine  (NORVASC) 5 MG tablet Take 1 tablet (5 mg total) by mouth daily. 90 tablet 1   aspirin EC 81 MG tablet Take 81 mg by mouth daily. Swallow whole.     blood glucose meter kit and supplies KIT Dispense based on patient and insurance preference. Use up to four times daily as directed. 1 each 0   Blood Glucose Monitoring Suppl (ONETOUCH VERIO FLEX SYSTEM) w/Device KIT Check Glucose fasting and 2 hours after meals 1 kit 0   carvedilol (COREG) 12.5 MG tablet TAKE ONE TABLET BY MOUTH TWICE DAILY WITH A MEAL 60 tablet 0   glucose blood (ONETOUCH VERIO) test strip Use as instructed 100 each 12   Lancets (ONETOUCH ULTRASOFT) lancets Use as instructed 100 each 12   meclizine (ANTIVERT) 12.5 MG  tablet TAKE ONE TABLET BY MOUTH three times daily AS NEEDED FOR dizziness 30 tablet 2   nitroGLYCERIN (NITROSTAT) 0.4 MG SL tablet Place 1 tablet (0.4 mg total) under the tongue every 5 (five) minutes as needed for chest pain. 90 tablet 3   pravastatin (PRAVACHOL) 80 MG tablet Take 1 tablet (80 mg total) by mouth every evening. 90 tablet 1   Semaglutide (RYBELSUS) 7 MG TABS Take 1 tablet (7 mg total) by mouth daily. 90 tablet 0   traMADol (ULTRAM) 50 MG tablet Take 50 mg by mouth 3 (three) times daily as needed.     Vitamin D, Ergocalciferol, (DRISDOL) 1.25 MG (50000 UNIT) CAPS capsule Take one pill twice weekly 8 capsule 5   No facility-administered medications prior to visit.    Allergies  Allergen Reactions   Sulfa Antibiotics Hives   Metformin And Related Nausea And Vomiting    Hallucinations and bring her down.    Review of Systems     Objective:        02/11/2023   10:21 AM 01/17/2023   10:22 AM 10/12/2022    2:51 PM  Vitals with BMI  Height 4\' 11"  4\' 11"  4\' 11"   Weight 167 lbs 10 oz 168 lbs 10 oz 185 lbs  BMI 33.83 34.03 37.35  Systolic 110 120 161  Diastolic 70 70 72  Pulse 60 67 56    No data found.   Physical Exam  Health Maintenance Due  Topic Date Due   OPHTHALMOLOGY EXAM  Never  done   DTaP/Tdap/Td (1 - Tdap) Never done   Medicare Annual Wellness (AWV)  02/19/2023    There are no preventive care reminders to display for this patient.   Lab Results  Component Value Date   TSH 0.068 (L) 01/17/2023   Lab Results  Component Value Date   WBC 5.1 01/17/2023   HGB 14.5 01/17/2023   HCT 42.9 01/17/2023   MCV 86 01/17/2023   PLT 295 01/17/2023   Lab Results  Component Value Date   NA 141 01/17/2023   K 4.6 01/17/2023   CO2 20 01/17/2023   GLUCOSE 114 (H) 01/17/2023   BUN 15 01/17/2023   CREATININE 0.66 01/17/2023   BILITOT 0.7 01/17/2023   ALKPHOS 71 01/17/2023   AST 22 01/17/2023   ALT 25 01/17/2023   PROT 6.7 01/17/2023   ALBUMIN 4.2 01/17/2023   CALCIUM 9.5 01/17/2023   ANIONGAP 8 08/16/2019   EGFR 88 01/17/2023   Lab Results  Component Value Date   CHOL 107 01/17/2023   Lab Results  Component Value Date   HDL 34 (L) 01/17/2023   Lab Results  Component Value Date   LDLCALC 50 01/17/2023   Lab Results  Component Value Date   TRIG 128 01/17/2023   Lab Results  Component Value Date   CHOLHDL 3.1 01/17/2023   Lab Results  Component Value Date   HGBA1C 5.9 (H) 01/17/2023       Assessment & Plan:  There are no diagnoses linked to this encounter.   No orders of the defined types were placed in this encounter.   No orders of the defined types were placed in this encounter.    Follow-up: No follow-ups on file.  An After Visit Summary was printed and given to the patient.  Jettie Pagan [facility] [phone]

## 2023-02-12 ENCOUNTER — Encounter: Payer: Self-pay | Admitting: Physician Assistant
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
## 2023-02-17 LAB — CDIFF NAA+O+P+STOOL CULTURE
E coli, Shiga toxin Assay: NEGATIVE
Toxigenic C. Difficile by PCR: NEGATIVE

## 2023-02-19 NOTE — Progress Notes (Deleted)
Cardiology Office Note:    Date:  02/21/2023   ID:  Tina Perkins, DOB Jan 11, 1941, MRN [id number]  PCP:  Marianne Sofia, PA-C  Cardiologist:  Norman Herrlich, MD    Referring MD: Marianne Sofia, PA-C    ASSESSMENT:    1. Coronary artery disease involving native coronary artery of native heart without angina pectoris   2. PAT (paroxysmal atrial tachycardia)   3. Essential hypertension   4. Dyslipidemia    PLAN:    In order of problems listed above:  ***   Next appointment: ***   Medication Adjustments/Labs and Tests Ordered: Current medicines are reviewed at length with the patient today.  Concerns regarding medicines are outlined above.  No orders of the defined types were placed in this encounter.  No orders of the defined types were placed in this encounter.   No chief complaint on file.   History of Present Illness:    Tina Perkins is a 82 y.o. female with a hx of type 2 diabetes hypertension hyperlipidemia paroxysmal atrial tachycardia and mild nonobstructive CAD with a remote history of myocardial infarction in 1992 and coronary artery calcium score of 12 last seen 10/12/2022.  Cardiac CTA September 2021 showed minimal coronary atherosclerosis less than 25% calcified plaque in the proximal left anterior descending coronary artery.  She is seen today directed by her PCP for recurrent chest pain. Compliance with diet, lifestyle and medications: *** Past Medical History:  Diagnosis Date   Acute respiratory disease due to COVID-19 virus 08/12/2019   BMI 35.0-35.9,adult 03/31/2020   Chest pain, precordial 04/10/2020   Chronic bilateral low back pain with bilateral sciatica 09/15/2021   Last Assessment & Plan:  Formatting of this note might be different from the original. 82 year old female seen today in initial consultation for her chronic back pain with bilateral lower extremity radiculopathy, left greater than right.    Will request previous lumbar spine MRI for review.  At  this time, I will start patient on gabapentin 100 mg q.h.s. x2 weeks then increase her to 200 mg q.h.s.    Coronary artery disease involving native coronary artery of native heart without angina pectoris 05/10/2016   Apparently coronary intervention done in 1993 in [place] She was fine to have 75% narrowing of the mid LAD, nitroglycerin was given reduction to 30-40% it was also described as bridging in that area   CVA (cerebral vascular accident) (HCC)    2020   Daytime somnolence 09/15/2020   Elevated blood pressure 09/15/2021   Last Assessment & Plan:  Formatting of this note might be different from the original. Blood pressure elevated in clinic today.  Patient will continue to follow-up with his primary care provider for further management.   Essential hypertension 04/10/2020   History of hepatitis 12/14/2019   Hypovitaminosis D 10/29/2021   Mixed hyperlipidemia 12/14/2019   Neck pain 09/15/2021   Last Assessment & Plan:  Formatting of this note might be different from the original. Atraumatic, acute onset cervical spine pain and stiffness x 2-3 weeks which has been progressive without improvement.  No associated fevers or chills.  Associated headaches, left shoulder, and left upper extremity pain that radiates down into her hand and fingers. This pain is most likely radicular in nature.      Obesity (BMI 30-39.9) 04/10/2020   Pain medication agreement 09/15/2021   Last Assessment & Plan:  Formatting of this note might be different from the original. Agreement signed and placed in the patient's chart today.Marland Kitchen  UDS to be collected in compliance with clinic policies and procedures.   Patient did not display any signs of abuse or diversion and has been checked on the [facility] Controlled Substance website.   Patent foramen ovale 12/14/2019   Pneumonia due to COVID-19 virus 08/12/2019   Pneumonia due to COVID-19 virus 08/12/2019   Prediabetes 12/28/2019   Pruritus 12/14/2019   Second  degree AV block, Mobitz type I 09/15/2020   Sequelae of cerebral infarction 12/14/2019   Shortness of breath 04/10/2020   Snoring 09/15/2020   Spinal stenosis, lumbar region with neurogenic claudication 02/28/2019   Formatting of this note might be different from the original. Added automatically from request for surgery [id number] Formatting of this note might be different from the original. Added automatically from request for surgery [id number]    Past Surgical History:  Procedure Laterality Date   CESAREAN SECTION N/A    COLON SURGERY     TOTAL KNEE ARTHROPLASTY Bilateral    2017 and 2019    Current Medications: No outpatient medications have been marked as taking for the 02/21/23 encounter (Appointment) with Baldo Daub, MD.     Allergies:   Sulfa antibiotics and Metformin and related   Social History   Socioeconomic History   Marital status: Widowed    Spouse name: Not on file   Number of children: 11   Years of education: 9   Highest education level: 9th grade  Occupational History   Occupation: Retired  Tobacco Use   Smoking status: Never    Passive exposure: Never   Smokeless tobacco: Never  Vaping Use   Vaping Use: Never used  Substance and Sexual Activity   Alcohol use: Never   Drug use: Never   Sexual activity: Not Currently  Other Topics Concern   Not on file  Social History Narrative   Not on file   Social Determinants of Health   Financial Resource Strain: Medium Risk (11/23/2021)   Overall Financial Resource Strain (CARDIA)    Difficulty of Paying Living Expenses: Somewhat hard  Food Insecurity: No Food Insecurity (08/11/2022)   Hunger Vital Sign    Worried About Running Out of Food in the Last Year: Never true    Ran Out of Food in the Last Year: Never true  Transportation Needs: No Transportation Needs (10/19/2022)   PRAPARE - Administrator, Civil Service (Medical): No    Lack of Transportation (Non-Medical): No  Physical Activity:  Inactive (10/19/2022)   Exercise Vital Sign    Days of Exercise per Week: 0 days    Minutes of Exercise per Session: 0 min  Stress: No Stress Concern Present (07/30/2021)   [employer] of Occupational Health - Occupational Stress Questionnaire    Feeling of Stress : Only a little  Social Connections: Socially Isolated (02/18/2022)   Social Connection and Isolation Panel [NHANES]    Frequency of Communication with Friends and Family: Once a week    Frequency of Social Gatherings with Friends and Family: More than three times a week    Attends Religious Services: Never    Database administrator or Organizations: No    Attends Banker Meetings: Never    Marital Status: Widowed     Family History: The patient's ***family history includes Cancer in her brother, daughter, father, mother, and sister; Heart attack in her father and mother. ROS:   Please see the history of present illness.    All other systems reviewed  and are negative.  EKGs/Labs/Other Studies Reviewed:    The following studies were reviewed today:  Cardiac Studies & Procedures       ECHOCARDIOGRAM  ECHOCARDIOGRAM COMPLETE 11/04/2021  Narrative ECHOCARDIOGRAM REPORT    Patient Name:   CARMELINE SWIGART Date of Exam: 11/04/2021 Medical Rec #:  [id number]     Height:       59.0 in Accession #:    [id number]    Weight:       181.4 lb Date of Birth:  [birth year]     BSA:          1.769 m Patient Age:    82 years      BP:           136/80 mmHg Patient Gender: F             HR:           79 bpm. Exam Location:  [facility]  Procedure: 2D Echo, Cardiac Doppler, Color Doppler and Strain Analysis  Indications:    Coronary artery disease involving native coronary artery of native heart without angina pectoris [I25.10 (ICD-10-CM)]; Essential hypertension [I10 (ICD-10-CM)]; Patent foramen ovale [Q21.12 (ICD-10-CM)]; Mixed hyperlipidemia [E78.2 (ICD-10-CM)]  History:        Patient has prior history of  Echocardiogram examinations, most recent 04/30/2020. CAD; Risk Factors:Dyslipidemia and Hypertension.  Sonographer:    Margreta Journey RDCS Referring Phys: [id number] ROBERT J KRASOWSKI  IMPRESSIONS   1. Focal hypertrophy of the basilar septum 18 mm. Left ventricular ejection fraction, by estimation, is 60 to 65%. The left ventricle has normal function. The left ventricle has no regional wall motion abnormalities. Left ventricular diastolic parameters are consistent with Grade I diastolic dysfunction (impaired relaxation). The average left ventricular global longitudinal strain is -18.6 %. The global longitudinal strain is normal. 2. Right ventricular systolic function is normal. The right ventricular size is normal. Tricuspid regurgitation signal is inadequate for assessing PA pressure. 3. The mitral valve is normal in structure. No evidence of mitral valve regurgitation. No evidence of mitral stenosis. 4. The aortic valve is tricuspid. Aortic valve regurgitation is not visualized. No aortic stenosis is present. 5. The inferior vena cava is normal in size with greater than 50% respiratory variability, suggesting right atrial pressure of 3 mmHg.  FINDINGS Left Ventricle: Focal hypertrophy of the basilar septum 18 mm. Left ventricular ejection fraction, by estimation, is 60 to 65%. The left ventricle has normal function. The left ventricle has no regional wall motion abnormalities. The average left ventricular global longitudinal strain is -18.6 %. The global longitudinal strain is normal. The left ventricular internal cavity size was normal in size. There is no left ventricular hypertrophy. Left ventricular diastolic parameters are consistent with Grade I diastolic dysfunction (impaired relaxation). Indeterminate filling pressures.  Right Ventricle: The right ventricular size is normal. No increase in right ventricular wall thickness. Right ventricular systolic function is normal. Tricuspid  regurgitation signal is inadequate for assessing PA pressure. The tricuspid regurgitant velocity is 2.58 m/s, and with an assumed right atrial pressure of 3 mmHg, the estimated right ventricular systolic pressure is 29.6 mmHg.  Left Atrium: Left atrial size was normal in size.  Right Atrium: Right atrial size was normal in size.  Pericardium: There is no evidence of pericardial effusion.  Mitral Valve: The mitral valve is normal in structure. No evidence of mitral valve regurgitation. No evidence of mitral valve stenosis.  Tricuspid Valve: The tricuspid valve is normal in structure. Tricuspid valve regurgitation  is trivial. No evidence of tricuspid stenosis.  Aortic Valve: The aortic valve is tricuspid. Aortic valve regurgitation is not visualized. No aortic stenosis is present.  Pulmonic Valve: The pulmonic valve was normal in structure. Pulmonic valve regurgitation is not visualized. No evidence of pulmonic stenosis.  Aorta: The aortic root and ascending aorta are structurally normal, with no evidence of dilitation and the aortic arch was not well visualized.  Venous: The pulmonary veins were not well visualized. The inferior vena cava is normal in size with greater than 50% respiratory variability, suggesting right atrial pressure of 3 mmHg.  IAS/Shunts: No atrial level shunt detected by color flow Doppler.   LEFT VENTRICLE PLAX 2D LVIDd:         3.90 cm Diastology LVIDs:         2.20 cm LV e' medial:  5.55 cm/s LV PW:         1.10 cm LV e' lateral: 6.96 cm/s LV IVS:        0.90 cm 2D Longitudinal Strain 2D Strain GLS Avg:     -18.6 %  RIGHT VENTRICLE             IVC RV Basal diam:  1.80 cm     IVC diam: 1.50 cm RV S prime:     10.90 cm/s TAPSE (M-mode): 2.0 cm  LEFT ATRIUM             Index        RIGHT ATRIUM           Index LA diam:        3.30 cm 1.87 cm/m   RA Area:     12.00 cm LA Vol (A2C):   25.2 ml 14.24 ml/m  RA Volume:   22.70 ml  12.83 ml/m LA Vol (A4C):    24.2 ml 13.68 ml/m LA Biplane Vol: 26.9 ml 15.20 ml/m AORTIC VALVE LVOT Vmax:   132.00 cm/s LVOT Vmean:  81.900 cm/s LVOT VTI:    0.239 m  AORTA Ao Root diam: 3.30 cm Ao Asc diam:  3.40 cm  TRICUSPID VALVE TR Peak grad:   26.6 mmHg TR Vmax:        258.00 cm/s  SHUNTS Systemic VTI: 0.24 m  Norman Herrlich MD Electronically signed by Norman Herrlich MD Signature Date/Time: 11/04/2021/5:30:18 PM    Final    MONITORS  LONG TERM MONITOR (3-14 DAYS) 09/04/2020  Narrative The patient wore the monitor for 14 days starting August 07, 2020. Indication: Palpitations  The minimum heart rate was 39 bpm, maximum heart rate was 167 bpm, and average heart rate was 77 bpm. Predominant underlying rhythm was Sinus Rhythm. Second Degree AV Block-Mobitz I (Wenckebach) was present ( August 20, 2020 at 11:23 PM).  39 Supraventricular Tachycardia runs occurred, the run with the fastest interval lasting 8 beats with a maximum rate of 167 bpm, the longest lasting 12 beats with an average rate of 132 bpm.  Premature atrial complexes were rare. Premature Ventricular complexes were rare.  No ventricular tachycardia, no pauses, No AV block and no atrial fibrillation present.  25 patient triggered events 10 associated with premature ventricular complex, 6 associated with premature atrial complex remaining associated with sinus rhythm. 10 diary events noted associated with premature atrial complex in sinus rhythm.   Conclusion: This study is remarkable for paroxysmal atrial tachycardia with variable block, and second degree AV block Mobitz type I which occur during the overnight hours.   CT SCANS  CT  CORONARY MORPH W/CTA COR W/SCORE 06/19/2020  Addendum 06/19/2020 10:13 PM ADDENDUM REPORT: 06/19/2020 22:11  CLINICAL DATA:  82 year old female with chest pain. Medical history includes hypertension, and hyperlipidemia.  EXAM: Cardiac/Coronary  CT  TECHNIQUE: The patient was scanned on a  [employer].  FINDINGS: A 120 kV prospective scan was triggered in the descending thoracic aorta at 111 HU's. Axial non-contrast 3 mm slices were carried out through the heart. The data set was analyzed on a dedicated work station and scored using the Agatson method. Gantry rotation speed was 250 msecs and collimation was .6 mm. No beta blockade and 0.8 mg of sl NTG was given. The 3D data set was reconstructed in 5% intervals of the 67-82 % of the R-R cycle. Diastolic phases were analyzed on a dedicated work station using MPR, MIP and VRT modes. The patient received 80 cc of contrast.  Aorta: Normal size.  No calcifications.  No dissection.  Aortic Valve:  Trileaflet.  No calcifications.  Coronary Arteries:  Normal coronary origin.  Right dominance.  RCA is a large dominant artery that gives rise to PDA and PLVB. There is no plaque.  Left main is a large artery that gives rise to LAD and LCX arteries.  LAD is a large vessel. There is a minimal (<24%) calcified plaque in the proximal portion of the LAD. The mid and distal portion with no plaque. D1 on with a minimal soft plaque in the proximal portion of the vessel. D2 with no plaque.  LCX is a non-dominant artery that gives rise to one large OM1 branch. There is no plaque.  Other findings:  Normal pulmonary vein drainage into the left atrium.  Normal left atrial appendage without a thrombus.  Normal size of the pulmonary artery.  IMPRESSION: 1. Coronary calcium score of 12. This was 23 percentile for age and sex matched control.  2. Normal coronary origin with right dominance.  3. Minimal Coronary artery disease. CADRAD 1. Recommend medical therapy.  [name], DO   Electronically Signed By: [name] DO On: 06/19/2020 22:11  Narrative EXAM: OVER-READ INTERPRETATION  CT CHEST  The following report is an over-read performed by radiologist Dr. Trudie Reed of [facility] on  06/19/2020. This over-read does not include interpretation of cardiac or coronary anatomy or pathology. The coronary calcium score/coronary CTA interpretation by the cardiologist is attached.  COMPARISON:  None.  FINDINGS: Aortic atherosclerosis. Within the visualized portions of the thorax there are no suspicious appearing pulmonary nodules or masses, there is no acute consolidative airspace disease, no pleural effusions, no pneumothorax and no lymphadenopathy. Visualized portions of the upper abdomen demonstrates diffuse low attenuation throughout the visualized hepatic parenchyma, indicative of hepatic steatosis. There are no aggressive appearing lytic or blastic lesions noted in the visualized portions of the skeleton.  IMPRESSION: 1.  Aortic Atherosclerosis (ICD10-I70.0). 2. Hepatic steatosis.  Electronically Signed: By: Trudie Reed M.D. On: 06/19/2020 13:23          EKG:  EKG ordered today and personally reviewed.  The ekg ordered today demonstrates ***  Recent Labs: 08/18/2022: Magnesium 2.2 01/17/2023: ALT 25; BUN 15; Creatinine, Ser 0.66; Hemoglobin 14.5; Platelets 295; Potassium 4.6; Sodium 141; TSH 0.068  Recent Lipid Panel    Component Value Date/Time   CHOL 107 01/17/2023 1121   TRIG 128 01/17/2023 1121   HDL 34 (L) 01/17/2023 1121   CHOLHDL 3.1 01/17/2023 1121   LDLCALC 50 01/17/2023 1121    Physical  Exam:    VS:  There were no vitals taken for this visit.    Wt Readings from Last 3 Encounters:  02/11/23 167 lb 9.6 oz (76 kg)  01/17/23 168 lb 9.6 oz (76.5 kg)  10/12/22 185 lb (83.9 kg)     GEN: *** Well nourished, well developed in no acute distress HEENT: Normal NECK: No JVD; No carotid bruits LYMPHATICS: No lymphadenopathy CARDIAC: ***RRR, no murmurs, rubs, gallops RESPIRATORY:  Clear to auscultation without rales, wheezing or rhonchi  ABDOMEN: Soft, non-tender, non-distended MUSCULOSKELETAL:  No edema; No deformity  SKIN: Warm and  dry NEUROLOGIC:  Alert and oriented x 3 PSYCHIATRIC:  Normal affect    Signed, Norman Herrlich, MD  02/21/2023 1:01 PM    [facility]

## 2023-02-21 ENCOUNTER — Ambulatory Visit: Payer: Medicare HMO | Attending: Cardiology | Admitting: Cardiology

## 2023-03-10 ENCOUNTER — Other Ambulatory Visit: Payer: Self-pay

## 2023-03-10 MED ORDER — PRAVASTATIN SODIUM 80 MG PO TABS: 80.0000 mg | ORAL_TABLET | Freq: Every evening | ORAL | 1 refills | Status: DC

## 2023-03-10 NOTE — Telephone Encounter (Signed)
Rx request for Pravastatin 80 mg # 100 tablets , refill sent to pharmacy

## 2023-03-14 ENCOUNTER — Telehealth: Payer: Self-pay

## 2023-03-14 ENCOUNTER — Other Ambulatory Visit: Payer: Self-pay

## 2023-03-14 DIAGNOSIS — I2510 Atherosclerotic heart disease of native coronary artery without angina pectoris: Secondary | ICD-10-CM

## 2023-03-14 DIAGNOSIS — I10 Essential (primary) hypertension: Secondary | ICD-10-CM

## 2023-03-14 DIAGNOSIS — E1165 Type 2 diabetes mellitus with hyperglycemia: Secondary | ICD-10-CM

## 2023-03-14 MED ORDER — CARVEDILOL 12.5 MG PO TABS
12.5000 mg | ORAL_TABLET | Freq: Two times a day (BID) | ORAL | 0 refills | Status: DC
Start: 2023-03-14 — End: 2023-05-09

## 2023-03-14 MED ORDER — RYBELSUS 7 MG PO TABS: 7.0000 mg | ORAL_TABLET | Freq: Every day | ORAL | 0 refills | Status: DC

## 2023-03-14 NOTE — Progress Notes (Cosign Needed)
Care Management & Coordination Services Pharmacy Team  Reason for Encounter: Medication coordination and delivery  Contacted patient to discuss medications and coordinate delivery from [facility]. Spoke with patient on 03/14/2023  Cycle dispensing form sent to [facility] for review.   Last adherence delivery date: 01-24-2023  Patient is due for next adherence delivery on: 03-14-2023. Patient never received 02-23-2023 delivery. Patient stated the driver called and said patient's road was closed due to construction and no one ever came back. Patient stated her road is being worked on and drivers can come. [name] is sending medication out tomorrow.  This delivery to include: Vials  30 Days  Aspirin 81mg -1 tab once daily             Pravastatin 80mg -1 tab once daily      Amlodipine 5mg -1 tab once daily        Carvedilol 12.5mg -1 tab twice daily     Vitamin D2 50,000 unit- 1 capsule twice weekly Rybelsus 7 mg daily Alendronate 70 mg take every 7 days Strips lancets   Patient declined the following medications this month: Tylenol- OTC   Nitroglycerin 0.4-Does not need, only as prn  Tramadol- temporarily use Meclizine- Plenty supply PRN  Refills requested from providers include: Carvedilol  Rybelsus   Confirmed delivery date of 03-15-2023, advised patient that pharmacy will contact them the morning of delivery.  Any concerns about your medications? No  How often do you forget or accidentally miss a dose? Never  Do you use a pillbox? No  Is patient in packaging No  Recent blood pressure readings are as follows:None available  Recent blood glucose readings are as follows:None available   Chart review: Recent office visits:  02-11-2023 Marianne Sofia, PA-C. Visit for diarrhea.  Recent consult visits:  None  Hospital visits:  None in previous 6 months  Medications: Outpatient Encounter Medications as of 03/14/2023  Medication Sig   ondansetron (ZOFRAN) 4 MG tablet  Take 1 tablet (4 mg total) by mouth every 8 (eight) hours as needed for nausea or vomiting.   alendronate (FOSAMAX) 70 MG tablet Take 1 tablet (70 mg total) by mouth every 7 (seven) days. Take with a full glass of water on an empty stomach.   amLODipine (NORVASC) 5 MG tablet Take 1 tablet (5 mg total) by mouth daily.   aspirin EC 81 MG tablet Take 81 mg by mouth daily. Swallow whole.   blood glucose meter kit and supplies KIT Dispense based on patient and insurance preference. Use up to four times daily as directed.   Blood Glucose Monitoring Suppl (ONETOUCH VERIO FLEX SYSTEM) w/Device KIT Check Glucose fasting and 2 hours after meals   carvedilol (COREG) 12.5 MG tablet Take 1 tablet (12.5 mg total) by mouth 2 (two) times daily with a meal.   glucose blood (ONETOUCH VERIO) test strip Use as instructed   Lancets (ONETOUCH ULTRASOFT) lancets Use as instructed   meclizine (ANTIVERT) 12.5 MG tablet TAKE ONE TABLET BY MOUTH three times daily AS NEEDED FOR dizziness   nitroGLYCERIN (NITROSTAT) 0.4 MG SL tablet Place 1 tablet (0.4 mg total) under the tongue every 5 (five) minutes as needed for chest pain.   pravastatin (PRAVACHOL) 80 MG tablet Take 1 tablet (80 mg total) by mouth every evening.   Semaglutide (RYBELSUS) 7 MG TABS Take 1 tablet (7 mg total) by mouth daily.   traMADol (ULTRAM) 50 MG tablet Take 50 mg by mouth 3 (three) times daily as needed.   Vitamin D, Ergocalciferol, (  DRISDOL) 1.25 MG (50000 UNIT) CAPS capsule Take one pill twice weekly   No facility-administered encounter medications on file as of 03/14/2023.   BP Readings from Last 3 Encounters:  02/11/23 110/70  01/17/23 120/70  10/12/22 120/72    Pulse Readings from Last 3 Encounters:  02/11/23 60  01/17/23 67  10/12/22 (!) 56    Lab Results  Component Value Date/Time   HGBA1C 5.9 (H) 01/17/2023 11:21 AM   HGBA1C 7.2 (H) 09/17/2022 10:58 AM   Lab Results  Component Value Date   CREATININE 0.66 01/17/2023   BUN 15  01/17/2023   GFRNONAA 84 07/31/2020   GFRAA 97 07/31/2020   NA 141 01/17/2023   K 4.6 01/17/2023   CALCIUM 9.5 01/17/2023   CO2 20 01/17/2023     Malecca Hicks CMA Clinical Pharmacist Assistant [phone]

## 2023-03-22 DIAGNOSIS — M4800 Spinal stenosis, site unspecified: Secondary | ICD-10-CM | POA: Diagnosis not present

## 2023-03-22 DIAGNOSIS — M1990 Unspecified osteoarthritis, unspecified site: Secondary | ICD-10-CM | POA: Diagnosis not present

## 2023-03-22 DIAGNOSIS — G3184 Mild cognitive impairment, so stated: Secondary | ICD-10-CM | POA: Diagnosis not present

## 2023-03-22 DIAGNOSIS — I252 Old myocardial infarction: Secondary | ICD-10-CM | POA: Diagnosis not present

## 2023-03-22 DIAGNOSIS — E785 Hyperlipidemia, unspecified: Secondary | ICD-10-CM | POA: Diagnosis not present

## 2023-03-22 DIAGNOSIS — I2510 Atherosclerotic heart disease of native coronary artery without angina pectoris: Secondary | ICD-10-CM | POA: Diagnosis not present

## 2023-03-22 DIAGNOSIS — E669 Obesity, unspecified: Secondary | ICD-10-CM | POA: Diagnosis not present

## 2023-03-22 DIAGNOSIS — E039 Hypothyroidism, unspecified: Secondary | ICD-10-CM | POA: Diagnosis not present

## 2023-03-22 DIAGNOSIS — I471 Supraventricular tachycardia, unspecified: Secondary | ICD-10-CM | POA: Diagnosis not present

## 2023-03-22 DIAGNOSIS — M810 Age-related osteoporosis without current pathological fracture: Secondary | ICD-10-CM | POA: Diagnosis not present

## 2023-04-25 ENCOUNTER — Other Ambulatory Visit: Payer: Self-pay | Admitting: Family Medicine

## 2023-04-25 DIAGNOSIS — E559 Vitamin D deficiency, unspecified: Secondary | ICD-10-CM

## 2023-04-26 ENCOUNTER — Other Ambulatory Visit: Payer: Self-pay

## 2023-04-26 DIAGNOSIS — E1165 Type 2 diabetes mellitus with hyperglycemia: Secondary | ICD-10-CM

## 2023-04-26 MED ORDER — RYBELSUS 7 MG PO TABS: 7.0000 mg | ORAL_TABLET | Freq: Every day | ORAL | 0 refills | Status: DC

## 2023-04-26 NOTE — Telephone Encounter (Signed)
Yes please do this

## 2023-04-26 NOTE — Telephone Encounter (Signed)
Patient called and stated she had her Social Security Card and [employer] card stolen and need a letter from our office stating she is a patient here and she is who she is, so she can get another copy of her social security card and a ID made. Please advise

## 2023-04-28 ENCOUNTER — Encounter: Payer: Self-pay | Admitting: Physician Assistant

## 2023-05-09 ENCOUNTER — Other Ambulatory Visit: Payer: Self-pay | Admitting: Physician Assistant

## 2023-05-09 DIAGNOSIS — I10 Essential (primary) hypertension: Secondary | ICD-10-CM

## 2023-05-09 DIAGNOSIS — I2510 Atherosclerotic heart disease of native coronary artery without angina pectoris: Secondary | ICD-10-CM

## 2023-05-09 NOTE — Progress Notes (Addendum)
Called [facility]'s listed phone number [phone] and it went through. Called patient and provided this phone number. She will call.

## 2023-05-09 NOTE — Telephone Encounter (Addendum)
The patient called this morning stating that she has been out of the [facility] for about three weeks and can not get through to [facility] stating that the number is invalided. Please advise.

## 2023-05-12 ENCOUNTER — Ambulatory Visit (INDEPENDENT_AMBULATORY_CARE_PROVIDER_SITE_OTHER): Payer: Medicare HMO

## 2023-05-12 VITALS — Ht 59.0 in | Wt 167.0 lb

## 2023-05-12 DIAGNOSIS — Z0000 Encounter for general adult medical examination without abnormal findings: Secondary | ICD-10-CM | POA: Diagnosis not present

## 2023-05-12 NOTE — Progress Notes (Signed)
Subjective:   Tina Perkins is a 82 y.o. female who presents for Medicare Annual (Subsequent) preventive examination.  Visit Complete: Virtual  I connected with  [name] on 05/12/23 by a audio enabled telemedicine application and verified that I am speaking with the correct person using two identifiers.  Patient Location: Home  Provider Location: Home Office  I discussed the limitations of evaluation and management by telemedicine. The patient expressed understanding and agreed to proceed.  Patient Medicare AWV questionnaire was completed by the patient on  ; I have confirmed that all information answered by patient is correct and no changes since this date.  Review of Systems    Vital Signs: Unable to obtain new vitals due to this being a telehealth visit.  Cardiac Risk Factors include: advanced age (>49men, >35 women);diabetes mellitus;hypertension     Objective:    Today's Vitals   05/12/23 0939  Weight: 167 lb (75.8 kg)  Height: 4\' 11"  (1.499 m)   Body mass index is 33.73 kg/m.     05/12/2023    9:49 AM 08/20/2021   10:19 AM 07/30/2021    5:54 PM 08/12/2019   11:02 PM  Advanced Directives  Does Patient Have a Medical Advance Directive? No No No No  Would patient like information on creating a medical advance directive? No - Patient declined Yes (ED - Information included in AVS) No - Patient declined No - Patient declined    Current Medications (verified) Outpatient Encounter Medications as of 05/12/2023  Medication Sig   ondansetron (ZOFRAN) 4 MG tablet Take 1 tablet (4 mg total) by mouth every 8 (eight) hours as needed for nausea or vomiting.   alendronate (FOSAMAX) 70 MG tablet Take 1 tablet (70 mg total) by mouth every 7 (seven) days. Take with a full glass of water on an empty stomach.   amLODipine (NORVASC) 5 MG tablet Take 1 tablet (5 mg total) by mouth daily.   aspirin EC 81 MG tablet Take 81 mg by mouth daily. Swallow whole.   blood glucose meter kit  and supplies KIT Dispense based on patient and insurance preference. Use up to four times daily as directed.   Blood Glucose Monitoring Suppl (ONETOUCH VERIO FLEX SYSTEM) w/Device KIT Check Glucose fasting and 2 hours after meals   carvedilol (COREG) 12.5 MG tablet TAKE ONE TABLET BY MOUTH TWICE DAILY WITH A MEAL   glucose blood (ONETOUCH VERIO) test strip Use as instructed   Lancets (ONETOUCH ULTRASOFT) lancets Use as instructed   meclizine (ANTIVERT) 12.5 MG tablet TAKE ONE TABLET BY MOUTH three times daily AS NEEDED FOR dizziness   nitroGLYCERIN (NITROSTAT) 0.4 MG SL tablet Place 1 tablet (0.4 mg total) under the tongue every 5 (five) minutes as needed for chest pain.   pravastatin (PRAVACHOL) 80 MG tablet Take 1 tablet (80 mg total) by mouth every evening.   Semaglutide (RYBELSUS) 7 MG TABS Take 1 tablet (7 mg total) by mouth daily.   traMADol (ULTRAM) 50 MG tablet Take 50 mg by mouth 3 (three) times daily as needed.   Vitamin D, Ergocalciferol, (DRISDOL) 1.25 MG (50000 UNIT) CAPS capsule TAKE ONE CAPSULE BY MOUTH TWICE WEEKLY   No facility-administered encounter medications on file as of 05/12/2023.    Allergies (verified) Sulfa antibiotics and Metformin and related   History: Past Medical History:  Diagnosis Date   Acute respiratory disease due to COVID-19 virus 08/12/2019   BMI 35.0-35.9,adult 03/31/2020   Chest pain, precordial 04/10/2020   Chronic bilateral  low back pain with bilateral sciatica 09/15/2021   Last Assessment & Plan:  Formatting of this note might be different from the original. 82 year old female seen today in initial consultation for her chronic back pain with bilateral lower extremity radiculopathy, left greater than right.    Will request previous lumbar spine MRI for review.  At this time, I will start patient on gabapentin 100 mg q.h.s. x2 weeks then increase her to 200 mg q.h.s.    Coronary artery disease involving native coronary artery of native heart without  angina pectoris 05/10/2016   Apparently coronary intervention done in 1993 in [place] She was fine to have 75% narrowing of the mid LAD, nitroglycerin was given reduction to 30-40% it was also described as bridging in that area   CVA (cerebral vascular accident) (HCC)    2020   Daytime somnolence 09/15/2020   Elevated blood pressure 09/15/2021   Last Assessment & Plan:  Formatting of this note might be different from the original. Blood pressure elevated in clinic today.  Patient will continue to follow-up with his primary care provider for further management.   Essential hypertension 04/10/2020   History of hepatitis 12/14/2019   Hypovitaminosis D 10/29/2021   Mixed hyperlipidemia 12/14/2019   Neck pain 09/15/2021   Last Assessment & Plan:  Formatting of this note might be different from the original. Atraumatic, acute onset cervical spine pain and stiffness x 2-3 weeks which has been progressive without improvement.  No associated fevers or chills.  Associated headaches, left shoulder, and left upper extremity pain that radiates down into her hand and fingers. This pain is most likely radicular in nature.      Obesity (BMI 30-39.9) 04/10/2020   Pain medication agreement 09/15/2021   Last Assessment & Plan:  Formatting of this note might be different from the original. Agreement signed and placed in the patient's chart today..  UDS to be collected in compliance with clinic policies and procedures.   Patient did not display any signs of abuse or diversion and has been checked on the [facility] Controlled Substance website.   Patent foramen ovale 12/14/2019   Pneumonia due to COVID-19 virus 08/12/2019   Pneumonia due to COVID-19 virus 08/12/2019   Prediabetes 12/28/2019   Pruritus 12/14/2019   Second degree AV block, Mobitz type I 09/15/2020   Sequelae of cerebral infarction 12/14/2019   Shortness of breath 04/10/2020   Snoring 09/15/2020   Spinal stenosis, lumbar region with neurogenic  claudication 02/28/2019   Formatting of this note might be different from the original. Added automatically from request for surgery [id number] Formatting of this note might be different from the original. Added automatically from request for surgery [id number]   Past Surgical History:  Procedure Laterality Date   CESAREAN SECTION N/A    COLON SURGERY     TOTAL KNEE ARTHROPLASTY Bilateral    2017 and 2019   Family History  Problem Relation Age of Onset   Cancer Mother    Heart attack Mother    Heart attack Father    Cancer Father    Cancer Sister    Cancer Brother    Cancer Daughter    Social History   Socioeconomic History   Marital status: Widowed    Spouse name: Not on file   Number of children: 11   Years of education: 9   Highest education level: 9th grade  Occupational History   Occupation: Retired  Tobacco Use   Smoking status: Never  Passive exposure: Never   Smokeless tobacco: Never  Vaping Use   Vaping status: Never Used  Substance and Sexual Activity   Alcohol use: Never   Drug use: Never   Sexual activity: Not Currently  Other Topics Concern   Not on file  Social History Narrative   Not on file   Social Determinants of Health   Financial Resource Strain: Low Risk  (05/12/2023)   Overall Financial Resource Strain (CARDIA)    Difficulty of Paying Living Expenses: Not hard at all  Food Insecurity: No Food Insecurity (08/11/2022)   Hunger Vital Sign    Worried About Running Out of Food in the Last Year: Never true    Ran Out of Food in the Last Year: Never true  Transportation Needs: No Transportation Needs (05/12/2023)   PRAPARE - Administrator, Civil Service (Medical): No    Lack of Transportation (Non-Medical): No  Physical Activity: Inactive (05/12/2023)   Exercise Vital Sign    Days of Exercise per Week: 0 days    Minutes of Exercise per Session: 0 min  Stress: No Stress Concern Present (05/12/2023)   [employer] of Occupational Health -  Occupational Stress Questionnaire    Feeling of Stress : Not at all  Social Connections: Socially Isolated (05/12/2023)   Social Connection and Isolation Panel [NHANES]    Frequency of Communication with Friends and Family: More than three times a week    Frequency of Social Gatherings with Friends and Family: More than three times a week    Attends Religious Services: Never    Database administrator or Organizations: No    Attends Banker Meetings: Never    Marital Status: Widowed    Tobacco Counseling Counseling given: Not Answered   Clinical Intake:    Activities of Daily Living    05/12/2023    9:48 AM  In your present state of health, do you have any difficulty performing the following activities:  Hearing? 0  Vision? 0  Difficulty concentrating or making decisions? 0  Walking or climbing stairs? 0  Dressing or bathing? 0  Doing errands, shopping? 0  Preparing Food and eating ? N  Using the Toilet? N  In the past six months, have you accidently leaked urine? N  Do you have problems with loss of bowel control? N  Managing your Medications? N  Managing your Finances? N  Housekeeping or managing your Housekeeping? N    Patient Care Team: Marianne Sofia, Cordelia Poche as PCP - General (Physician Assistant) Zettie Pho, [facility] (Inactive) as Pharmacist (Pharmacist)  Indicate any recent Medical Services you may have received from other than [facility] providers in the past year (date may be approximate).     Assessment:   This is a routine wellness examination for Tina Perkins.  Hearing/Vision screen Hearing Screening - Comments:: Denies hearing difficulties   Vision Screening - Comments::  - Not up to date with routine eye exams Patient deferred.     Dietary issues and exercise activities discussed:     Goals Addressed               This Visit's Progress     Stay healthy (pt-stated)         Depression Screen    05/12/2023    9:47 AM 02/11/2023   10:26 AM  01/17/2023   10:23 AM 01/17/2023   10:22 AM 09/17/2022   10:39 AM 02/18/2022    1:20 PM 08/20/2021  10:18 AM  PHQ 2/9 Scores  PHQ - 2 Score 0 1 1 0 5 0 0  PHQ- 9 Score  7 7  15       Fall Risk    05/12/2023    9:49 AM 02/11/2023   10:25 AM 01/17/2023   10:24 AM 02/18/2022    1:23 PM 08/20/2021   10:17 AM  Fall Risk   Falls in the past year? 0 0 0 1 1  Number falls in past yr: 0 0 0 0 0  Injury with Fall? 0 0 0 0 1  Risk for fall due to : No Fall Risks No Fall Risks No Fall Risks Impaired mobility;Impaired vision   Follow up Falls prevention discussed Falls evaluation completed;Falls prevention discussed Falls evaluation completed      MEDICARE RISK AT HOME:  Medicare Risk at Home - 05/12/23 0955     Any stairs in or around the home? Yes    If so, are there any without handrails? No    Home free of loose throw rugs in walkways, pet beds, electrical cords, etc? Yes    Adequate lighting in your home to reduce risk of falls? Yes    Life alert? Yes    Use of a cane, walker or w/c? No    Grab bars in the bathroom? No    Shower chair or bench in shower? Yes    Elevated toilet seat or a handicapped toilet? No             TIMED UP AND GO:  Was the test performed?  No    Cognitive Function:        05/12/2023    9:49 AM 02/18/2022    1:14 PM  6CIT Screen  What Year? 0 points 0 points  What month? 0 points 0 points  What time? 0 points 0 points  Count back from 20 0 points 0 points  Months in reverse 0 points 0 points  Repeat phrase 0 points 2 points  Total Score 0 points 2 points    Immunizations  There is no immunization history on file for this patient.  TDAP status: Due, Education has been provided regarding the importance of this vaccine. Advised may receive this vaccine at local pharmacy or Health Dept. Aware to provide a copy of the vaccination record if obtained from local pharmacy or Health Dept. Verbalized acceptance and understanding.  Flu Vaccine status:  Declined, Education has been provided regarding the importance of this vaccine but patient still declined. Advised may receive this vaccine at local pharmacy or Health Dept. Aware to provide a copy of the vaccination record if obtained from local pharmacy or Health Dept. Verbalized acceptance and understanding.    Screening Tests Health Maintenance  Topic Date Due   OPHTHALMOLOGY EXAM  Never done   DTaP/Tdap/Td (1 - Tdap) Never done   FOOT EXAM  03/11/2023   INFLUENZA VACCINE  05/05/2023   HEMOGLOBIN A1C  07/19/2023   MAMMOGRAM  08/21/2023   Diabetic kidney evaluation - Urine ACR  09/18/2023   Diabetic kidney evaluation - eGFR measurement  01/17/2024   Medicare Annual Wellness (AWV)  05/11/2024   DEXA SCAN  Completed   HPV VACCINES  Aged Out   Pneumonia Vaccine 1+ Years old  Discontinued   COVID-19 Vaccine  Discontinued   Hepatitis C Screening  Discontinued   Zoster Vaccines- Shingrix  Discontinued    Health Maintenance  Health Maintenance Due  Topic Date Due  OPHTHALMOLOGY EXAM  Never done   DTaP/Tdap/Td (1 - Tdap) Never done   FOOT EXAM  03/11/2023   INFLUENZA VACCINE  05/05/2023    Colorectal cancer screening: No longer required.   Mammogram status: Completed 08/10/22. Repeat every year  Bone Density status: Completed 10/12/22. Results reflect: Bone density results: OSTEOPOROSIS. Repeat every   years.  Lung Cancer Screening: (Low Dose CT Chest recommended if Age 21-80 years, 20 pack-year currently smoking OR have quit w/in 15years.) does not qualify.   Additional Screening:    Vision Screening: Recommended annual ophthalmology exams for early detection of glaucoma and other disorders of the eye. Is the patient up to date with their annual eye exam?  No  Who is the provider or what is the name of the office in which the patient attends annual eye exams? Patient deferred If pt is not established with a provider, would they like to be referred to a provider to  establish care? No .   Dental Screening: Recommended annual dental exams for proper oral hygiene  Diabetic Foot Exam: Diabetic Foot Exam: Completed 03/10/22  Community Resource Referral / Chronic Care Management:  CRR required this visit?  No   CCM required this visit?  No     Plan:     I have personally reviewed and noted the following in the patient's chart:   Medical and social history Use of alcohol, tobacco or illicit drugs  Current medications and supplements including opioid prescriptions. Patient is currently taking opioid prescriptions. Information provided to patient regarding non-opioid alternatives. Patient advised to discuss non-opioid treatment plan with their provider. Functional ability and status Nutritional status Physical activity Advanced directives List of other physicians Hospitalizations, surgeries, and ER visits in previous 12 months Vitals Screenings to include cognitive, depression, and falls Referrals and appointments  In addition, I have reviewed and discussed with patient certain preventive protocols, quality metrics, and best practice recommendations. A written personalized care plan for preventive services as well as general preventive health recommendations were provided to patient.     Tillie Rung, LPN   10/09/1094   After Visit Summary: (MyChart) Due to this being a telephonic visit, the after visit summary with patients personalized plan was offered to patient via MyChart   Nurse Notes: None

## 2023-05-12 NOTE — Patient Instructions (Addendum)
Tina Perkins , Thank you for taking time to come for your Medicare Wellness Visit. I appreciate your ongoing commitment to your health goals. Please review the following plan we discussed and let me know if I can assist you in the future.   Referrals/Orders/Follow-Ups/Clinician Recommendations:   This is a list of the screening recommended for you and due dates:  Health Maintenance  Topic Date Due   Eye exam for diabetics  Never done   DTaP/Tdap/Td vaccine (1 - Tdap) Never done   Complete foot exam   03/11/2023   Flu Shot  05/05/2023   Hemoglobin A1C  07/19/2023   Mammogram  08/21/2023   Yearly kidney health urinalysis for diabetes  09/18/2023   Yearly kidney function blood test for diabetes  01/17/2024   Medicare Annual Wellness Visit  05/11/2024   DEXA scan (bone density measurement)  Completed   HPV Vaccine  Aged Out   Pneumonia Vaccine  Discontinued   COVID-19 Vaccine  Discontinued   Hepatitis C Screening  Discontinued   Zoster (Shingles) Vaccine  Discontinued  Opioid Pain Medicine Management Opioids are powerful medicines that are used to treat moderate to severe pain. When used for short periods of time, they can help you to: Sleep better. Do better in physical or occupational therapy. Feel better in the first few days after an injury. Recover from surgery. Opioids should be taken with the supervision of a trained health care provider. They should be taken for the shortest period of time possible. This is because opioids can be addictive, and the longer you take opioids, the greater your risk of addiction. This addiction can also be called opioid use disorder. What are the risks? Using opioid pain medicines for longer than 3 days increases your risk of side effects. Side effects include: Constipation. Nausea and vomiting. Breathing difficulties (respiratory depression). Drowsiness. Confusion. Opioid use disorder. Itching. Taking opioid pain medicine for a long period of  time can affect your ability to do daily tasks. It also puts you at risk for: Motor vehicle crashes. Depression. Suicide. Heart attack. Overdose, which can be life-threatening. What is a pain treatment plan? A pain treatment plan is an agreement between you and your health care provider. Pain is unique to each person, and treatments vary depending on your condition. To manage your pain, you and your health care provider need to work together. To help you do this: Discuss the goals of your treatment, including how much pain you might expect to have and how you will manage the pain. Review the risks and benefits of taking opioid medicines. Remember that a good treatment plan uses more than one approach and minimizes the chance of side effects. Be honest about the amount of medicines you take and about any drug or alcohol use. Get pain medicine prescriptions from only one health care provider. Pain can be managed with many types of alternative treatments. Ask your health care provider to refer you to one or more specialists who can help you manage pain through: Physical or occupational therapy. Counseling (cognitive behavioral therapy). Good nutrition. Biofeedback. Massage. Meditation. Non-opioid medicine. Following a gentle exercise program. How to use opioid pain medicine Taking medicine Take your pain medicine exactly as told by your health care provider. Take it only when you need it. If your pain gets less severe, you may take less than your prescribed dose if your health care provider approves. If you are not having pain, do nottake pain medicine unless your health care provider  tells you to take it. If your pain is severe, do nottry to treat it yourself by taking more pills than instructed on your prescription. Contact your health care provider for help. Write down the times when you take your pain medicine. It is easy to become confused while on pain medicine. Writing the time can  help you avoid overdose. Take other over-the-counter or prescription medicines only as told by your health care provider. Keeping yourself and others safe  While you are taking opioid pain medicine: Do not drive, use machinery, or power tools. Do not sign legal documents. Do not drink alcohol. Do not take sleeping pills. Do not supervise children by yourself. Do not do activities that require climbing or being in high places. Do not go to a lake, river, ocean, spa, or swimming pool. Do not share your pain medicine with anyone. Keep pain medicine in a locked cabinet or in a secure area where pets and children cannot reach it. Stopping your use of opioids If you have been taking opioid medicine for more than a few weeks, you may need to slowly decrease (taper) how much you take until you stop completely. Tapering your use of opioids can decrease your risk of symptoms of withdrawal, such as: Pain and cramping in the abdomen. Nausea. Sweating. Sleepiness. Restlessness. Uncontrollable shaking (tremors). Cravings for the medicine. Do not attempt to taper your use of opioids on your own. Talk with your health care provider about how to do this. Your health care provider may prescribe a step-down schedule based on how much medicine you are taking and how long you have been taking it. Getting rid of leftover pills Do not save any leftover pills. Get rid of leftover pills safely by: Taking the medicine to a prescription take-back program. This is usually offered by the county or law enforcement. Bringing them to a pharmacy that has a drug disposal container. Flushing them down the toilet. Check the label or package insert of your medicine to see whether this is safe to do. Throwing them out in the trash. Check the label or package insert of your medicine to see whether this is safe to do. If it is safe to throw it out, remove the medicine from the original container, put it into a sealable bag or  container, and mix it with used coffee grounds, food scraps, dirt, or cat litter before putting it in the trash. Follow these instructions at home: Activity Do exercises as told by your health care provider. Avoid activities that make your pain worse. Return to your normal activities as told by your health care provider. Ask your health care provider what activities are safe for you. General instructions You may need to take these actions to prevent or treat constipation: Drink enough fluid to keep your urine pale yellow. Take over-the-counter or prescription medicines. Eat foods that are high in fiber, such as beans, whole grains, and fresh fruits and vegetables. Limit foods that are high in fat and processed sugars, such as fried or sweet foods. Keep all follow-up visits. This is important. Where to find support If you have been taking opioids for a long time, you may benefit from receiving support for quitting from a local support group or counselor. Ask your health care provider for a referral to these resources in your area. Where to find more information Centers for Disease Control and Prevention (CDC): FootballExhibition.com.br [place] Food and Drug Administration (FDA): PumpkinSearch.com.ee Get help right away if: You may have taken  too much of an opioid (overdosed). Common symptoms of an overdose: Your breathing is slower or more shallow than normal. You have a very slow heartbeat (pulse). You have slurred speech. You have nausea and vomiting. Your pupils become very small. You have other potential symptoms: You are very confused. You faint or feel like you will faint. You have cold, clammy skin. You have blue lips or fingernails. You have thoughts of harming yourself or harming others. These symptoms may represent a serious problem that is an emergency. Do not wait to see if the symptoms will go away. Get medical help right away. Call your local emergency services (911 in the [place] Do not drive  yourself to the hospital.  If you ever feel like you may hurt yourself or others, or have thoughts about taking your own life, get help right away. Go to your nearest emergency department or: Call your local emergency services (911 in the [place] Call the [facility] [phone])010-7386 in the [place] Call a suicide crisis helpline, such as the National Suicide Prevention Lifeline at 502 112 9650 or 988 in the [place] This is open 24 hours a day in the [place] Text the Crisis Text Line at 380-766-7507 (in the [place] Summary Opioid medicines can help you manage moderate to severe pain for a short period of time. A pain treatment plan is an agreement between you and your health care provider. Discuss the goals of your treatment, including how much pain you might expect to have and how you will manage the pain. If you think that you or someone else may have taken too much of an opioid, get medical help right away. This information is not intended to replace advice given to you by your health care provider. Make sure you discuss any questions you have with your health care provider. Document Revised: 04/15/2021 Document Reviewed: 12/31/2020 Elsevier Patient Education  2024 [employer]   Advanced directives: (Declined) Advance directive discussed with you today. Even though you declined this today, please call our office should you change your mind, and we can give you the proper paperwork for you to fill out.  Next Medicare Annual Wellness Visit scheduled for next year: Yes  Preventive Care 35 Years and Older, Female Preventive care refers to lifestyle choices and visits with your health care provider that can promote health and wellness. What does preventive care include? A yearly physical exam. This is also called an annual well check. Dental exams once or twice a year. Routine eye exams. Ask your health care provider how often you should have your eyes checked. Personal lifestyle  choices, including: Daily care of your teeth and gums. Regular physical activity. Eating a healthy diet. Avoiding tobacco and drug use. Limiting alcohol use. Practicing safe sex. Taking low-dose aspirin every day. Taking vitamin and mineral supplements as recommended by your health care provider. What happens during an annual well check? The services and screenings done by your health care provider during your annual well check will depend on your age, overall health, lifestyle risk factors, and family history of disease. Counseling  Your health care provider may ask you questions about your: Alcohol use. Tobacco use. Drug use. Emotional well-being. Home and relationship well-being. Sexual activity. Eating habits. History of falls. Memory and ability to understand (cognition). Work and work Astronomer. Reproductive health. Screening  You may have the following tests or measurements: Height, weight, and BMI. Blood pressure. Lipid and cholesterol levels. These may be checked every 5 years, or more  frequently if you are over 42 years old. Skin check. Lung cancer screening. You may have this screening every year starting at age 25 if you have a 30-pack-year history of smoking and currently smoke or have quit within the past 15 years. Fecal occult blood test (FOBT) of the stool. You may have this test every year starting at age 79. Flexible sigmoidoscopy or colonoscopy. You may have a sigmoidoscopy every 5 years or a colonoscopy every 10 years starting at age 85. Hepatitis C blood test. Hepatitis B blood test. Sexually transmitted disease (STD) testing. Diabetes screening. This is done by checking your blood sugar (glucose) after you have not eaten for a while (fasting). You may have this done every 1-3 years. Bone density scan. This is done to screen for osteoporosis. You may have this done starting at age 43. Mammogram. This may be done every 1-2 years. Talk to your health care  provider about how often you should have regular mammograms. Talk with your health care provider about your test results, treatment options, and if necessary, the need for more tests. Vaccines  Your health care provider may recommend certain vaccines, such as: Influenza vaccine. This is recommended every year. Tetanus, diphtheria, and acellular pertussis (Tdap, Td) vaccine. You may need a Td booster every 10 years. Zoster vaccine. You may need this after age 28. Pneumococcal 13-valent conjugate (PCV13) vaccine. One dose is recommended after age 38. Pneumococcal polysaccharide (PPSV23) vaccine. One dose is recommended after age 39. Talk to your health care provider about which screenings and vaccines you need and how often you need them. This information is not intended to replace advice given to you by your health care provider. Make sure you discuss any questions you have with your health care provider. Document Released: 10/17/2015 Document Revised: 06/09/2016 Document Reviewed: 07/22/2015 Elsevier Interactive Patient Education  2017 [employer].  Fall Prevention in the Home Falls can cause injuries. They can happen to people of all ages. There are many things you can do to make your home safe and to help prevent falls. What can I do on the outside of my home? Regularly fix the edges of walkways and driveways and fix any cracks. Remove anything that might make you trip as you walk through a door, such as a raised step or threshold. Trim any bushes or trees on the path to your home. Use bright outdoor lighting. Clear any walking paths of anything that might make someone trip, such as rocks or tools. Regularly check to see if handrails are loose or broken. Make sure that both sides of any steps have handrails. Any raised decks and porches should have guardrails on the edges. Have any leaves, snow, or ice cleared regularly. Use sand or salt on walking paths during winter. Clean up any  spills in your garage right away. This includes oil or grease spills. What can I do in the bathroom? Use night lights. Install grab bars by the toilet and in the tub and shower. Do not use towel bars as grab bars. Use non-skid mats or decals in the tub or shower. If you need to sit down in the shower, use a plastic, non-slip stool. Keep the floor dry. Clean up any water that spills on the floor as soon as it happens. Remove soap buildup in the tub or shower regularly. Attach bath mats securely with double-sided non-slip rug tape. Do not have throw rugs and other things on the floor that can make you trip. What can  I do in the bedroom? Use night lights. Make sure that you have a light by your bed that is easy to reach. Do not use any sheets or blankets that are too big for your bed. They should not hang down onto the floor. Have a firm chair that has side arms. You can use this for support while you get dressed. Do not have throw rugs and other things on the floor that can make you trip. What can I do in the kitchen? Clean up any spills right away. Avoid walking on wet floors. Keep items that you use a lot in easy-to-reach places. If you need to reach something above you, use a strong step stool that has a grab bar. Keep electrical cords out of the way. Do not use floor polish or wax that makes floors slippery. If you must use wax, use non-skid floor wax. Do not have throw rugs and other things on the floor that can make you trip. What can I do with my stairs? Do not leave any items on the stairs. Make sure that there are handrails on both sides of the stairs and use them. Fix handrails that are broken or loose. Make sure that handrails are as long as the stairways. Check any carpeting to make sure that it is firmly attached to the stairs. Fix any carpet that is loose or worn. Avoid having throw rugs at the top or bottom of the stairs. If you do have throw rugs, attach them to the floor  with carpet tape. Make sure that you have a light switch at the top of the stairs and the bottom of the stairs. If you do not have them, ask someone to add them for you. What else can I do to help prevent falls? Wear shoes that: Do not have high heels. Have rubber bottoms. Are comfortable and fit you well. Are closed at the toe. Do not wear sandals. If you use a stepladder: Make sure that it is fully opened. Do not climb a closed stepladder. Make sure that both sides of the stepladder are locked into place. Ask someone to hold it for you, if possible. Clearly mark and make sure that you can see: Any grab bars or handrails. First and last steps. Where the edge of each step is. Use tools that help you move around (mobility aids) if they are needed. These include: Canes. Walkers. Scooters. Crutches. Turn on the lights when you go into a dark area. Replace any light bulbs as soon as they burn out. Set up your furniture so you have a clear path. Avoid moving your furniture around. If any of your floors are uneven, fix them. If there are any pets around you, be aware of where they are. Review your medicines with your doctor. Some medicines can make you feel dizzy. This can increase your chance of falling. Ask your doctor what other things that you can do to help prevent falls. This information is not intended to replace advice given to you by your health care provider. Make sure you discuss any questions you have with your health care provider. Document Released: 07/17/2009 Document Revised: 02/26/2016 Document Reviewed: 10/25/2014 Elsevier Interactive Patient Education  2017 [employer].

## 2023-05-31 ENCOUNTER — Encounter: Payer: Self-pay | Admitting: Physician Assistant

## 2023-05-31 ENCOUNTER — Ambulatory Visit (INDEPENDENT_AMBULATORY_CARE_PROVIDER_SITE_OTHER): Payer: Medicare HMO | Admitting: Physician Assistant

## 2023-05-31 VITALS — BP 98/58 | HR 55 | Temp 97.7°F | Ht 59.0 in | Wt 164.0 lb

## 2023-05-31 DIAGNOSIS — E039 Hypothyroidism, unspecified: Secondary | ICD-10-CM | POA: Diagnosis not present

## 2023-05-31 DIAGNOSIS — G63 Polyneuropathy in diseases classified elsewhere: Secondary | ICD-10-CM

## 2023-05-31 DIAGNOSIS — I700 Atherosclerosis of aorta: Secondary | ICD-10-CM

## 2023-05-31 DIAGNOSIS — I2510 Atherosclerotic heart disease of native coronary artery without angina pectoris: Secondary | ICD-10-CM

## 2023-05-31 DIAGNOSIS — I10 Essential (primary) hypertension: Secondary | ICD-10-CM | POA: Diagnosis not present

## 2023-05-31 DIAGNOSIS — E1165 Type 2 diabetes mellitus with hyperglycemia: Secondary | ICD-10-CM

## 2023-05-31 DIAGNOSIS — M5440 Lumbago with sciatica, unspecified side: Secondary | ICD-10-CM

## 2023-05-31 DIAGNOSIS — E559 Vitamin D deficiency, unspecified: Secondary | ICD-10-CM | POA: Diagnosis not present

## 2023-05-31 DIAGNOSIS — E782 Mixed hyperlipidemia: Secondary | ICD-10-CM | POA: Diagnosis not present

## 2023-05-31 DIAGNOSIS — I4719 Other supraventricular tachycardia: Secondary | ICD-10-CM

## 2023-05-31 DIAGNOSIS — M48062 Spinal stenosis, lumbar region with neurogenic claudication: Secondary | ICD-10-CM

## 2023-05-31 MED ORDER — CARVEDILOL 12.5 MG PO TABS
12.5000 mg | ORAL_TABLET | Freq: Two times a day (BID) | ORAL | 1 refills | Status: AC
Start: 2023-05-31 — End: ?

## 2023-05-31 MED ORDER — AMLODIPINE BESYLATE 5 MG PO TABS
5.0000 mg | ORAL_TABLET | Freq: Every day | ORAL | 1 refills | Status: DC
Start: 2023-05-31 — End: 2024-01-19

## 2023-05-31 MED ORDER — ALENDRONATE SODIUM 70 MG PO TABS: 70.0000 mg | ORAL_TABLET | ORAL | 1 refills | Status: DC

## 2023-05-31 MED ORDER — VITAMIN D (ERGOCALCIFEROL) 1.25 MG (50000 UNIT) PO CAPS
ORAL_CAPSULE | ORAL | 1 refills | Status: DC
Start: 2023-05-31 — End: 2024-01-19

## 2023-05-31 MED ORDER — RYBELSUS 7 MG PO TABS
7.0000 mg | ORAL_TABLET | Freq: Every day | ORAL | 1 refills | Status: DC
Start: 2023-05-31 — End: 2024-01-19

## 2023-05-31 MED ORDER — PRAVASTATIN SODIUM 80 MG PO TABS: 80.0000 mg | ORAL_TABLET | Freq: Every evening | ORAL | 1 refills | Status: DC

## 2023-05-31 NOTE — Progress Notes (Signed)
Subjective:  Patient ID: Tina Perkins, female    DOB: 21-Aug-1941  Age: 82 y.o. MRN: [id number]  Chief Complaint  Patient presents with   Medical Management of Chronic Issues    Diabetes  Hyperlipidemia  Hypertension    Pt presents for follow up of hypertension. Patient was diagnosed in 2010 The patient is tolerating the medication well without side effects. Compliance with treatment has been good; including taking medication as directed , maintains a healthy diet and  and following up as directed.currently taking norvasc 5mg  qd and coreg 12.5mg  bid Pt also with history of PAT and CAD - pt follows with cardiologist and has appt in a few months Denies chest pain, dyspnea, edema  Mixed hyperlipidemia  Pt presents with hyperlipidemia.  Compliance with treatment has been good The patient is compliant with medications, maintains a low cholesterol diet , follows up as directed ,  . The patient denies experiencing any hypercholesterolemia related symptoms. She is currently on pravachol 80mg  qd  Pt with history of vitamin D def - currently on weekly supplement - due for labwork  Pt with history of cervical spondylosis with radiculopathy, chronic low back pain with sciatica and history of chronic knee pain with knee replacement - pt states that she has not followed up with ortho in over a year She is hesitant to schedule but says she cannot handle her pain anymore (has history of getting shots, going to pain management etc) - is currently on tramadol as needed She has not had MRI of lower back since 2019 --- she does agree however to make referral back to [facility]  Pt with diabetes.  Pt states she is taking rybelsus 7mg  qd and tolerating med well.  Says glucose range usually around 100 fasting - voices no problems or concerns Is due for eye exam  Pt was made a GI referral at last visit for decreased appetite, nausea, vomiting , abdominal pain and weight loss.  States she did not go for  appt and she feels fine without symptoms now and does not want to schedule with GI  Current Outpatient Medications on File Prior to Visit  Medication Sig Dispense Refill   aspirin EC 81 MG tablet Take 81 mg by mouth daily. Swallow whole.     blood glucose meter kit and supplies KIT Dispense based on patient and insurance preference. Use up to four times daily as directed. 1 each 0   Blood Glucose Monitoring Suppl (ONETOUCH VERIO FLEX SYSTEM) w/Device KIT Check Glucose fasting and 2 hours after meals 1 kit 0   glucose blood (ONETOUCH VERIO) test strip Use as instructed 100 each 12   Lancets (ONETOUCH ULTRASOFT) lancets Use as instructed 100 each 12   meclizine (ANTIVERT) 12.5 MG tablet TAKE ONE TABLET BY MOUTH three times daily AS NEEDED FOR dizziness 30 tablet 2   ondansetron (ZOFRAN) 4 MG tablet Take 1 tablet (4 mg total) by mouth every 8 (eight) hours as needed for nausea or vomiting. 20 tablet 0   traMADol (ULTRAM) 50 MG tablet Take 50 mg by mouth 3 (three) times daily as needed.     nitroGLYCERIN (NITROSTAT) 0.4 MG SL tablet Place 1 tablet (0.4 mg total) under the tongue every 5 (five) minutes as needed for chest pain. 90 tablet 3   No current facility-administered medications on file prior to visit.   Past Medical History:  Diagnosis Date   Acute respiratory disease due to COVID-19 virus 08/12/2019   BMI 35.0-35.9,adult 03/31/2020  Chest pain, precordial 04/10/2020   Chronic bilateral low back pain with bilateral sciatica 09/15/2021   Last Assessment & Plan:  Formatting of this note might be different from the original. 82 year old female seen today in initial consultation for her chronic back pain with bilateral lower extremity radiculopathy, left greater than right.    Will request previous lumbar spine MRI for review.  At this time, I will start patient on gabapentin 100 mg q.h.s. x2 weeks then increase her to 200 mg q.h.s.    Coronary artery disease involving native coronary artery  of native heart without angina pectoris 05/10/2016   Apparently coronary intervention done in 1993 in [place] She was fine to have 75% narrowing of the mid LAD, nitroglycerin was given reduction to 30-40% it was also described as bridging in that area   CVA (cerebral vascular accident) (HCC)    2020   Daytime somnolence 09/15/2020   Elevated blood pressure 09/15/2021   Last Assessment & Plan:  Formatting of this note might be different from the original. Blood pressure elevated in clinic today.  Patient will continue to follow-up with his primary care provider for further management.   Essential hypertension 04/10/2020   History of hepatitis 12/14/2019   Hypovitaminosis D 10/29/2021   Mixed hyperlipidemia 12/14/2019   Neck pain 09/15/2021   Last Assessment & Plan:  Formatting of this note might be different from the original. Atraumatic, acute onset cervical spine pain and stiffness x 2-3 weeks which has been progressive without improvement.  No associated fevers or chills.  Associated headaches, left shoulder, and left upper extremity pain that radiates down into her hand and fingers. This pain is most likely radicular in nature.      Obesity (BMI 30-39.9) 04/10/2020   Pain medication agreement 09/15/2021   Last Assessment & Plan:  Formatting of this note might be different from the original. Agreement signed and placed in the patient's chart today..  UDS to be collected in compliance with clinic policies and procedures.   Patient did not display any signs of abuse or diversion and has been checked on the [facility] Controlled Substance website.   Patent foramen ovale 12/14/2019   Pneumonia due to COVID-19 virus 08/12/2019   Pneumonia due to COVID-19 virus 08/12/2019   Prediabetes 12/28/2019   Pruritus 12/14/2019   Second degree AV block, Mobitz type I 09/15/2020   Sequelae of cerebral infarction 12/14/2019   Shortness of breath 04/10/2020   Snoring 09/15/2020   Spinal stenosis, lumbar  region with neurogenic claudication 02/28/2019   Formatting of this note might be different from the original. Added automatically from request for surgery [id number] Formatting of this note might be different from the original. Added automatically from request for surgery [id number]   Past Surgical History:  Procedure Laterality Date   CESAREAN SECTION N/A    COLON SURGERY     TOTAL KNEE ARTHROPLASTY Bilateral    2017 and 2019    Family History  Problem Relation Age of Onset   Cancer Mother    Heart attack Mother    Heart attack Father    Cancer Father    Cancer Sister    Cancer Brother    Cancer Daughter    Social History   Socioeconomic History   Marital status: Widowed    Spouse name: Not on file   Number of children: 11   Years of education: 9   Highest education level: 9th grade  Occupational History   Occupation: Retired  Tobacco Use   Smoking status: Never    Passive exposure: Never   Smokeless tobacco: Never  Vaping Use   Vaping status: Never Used  Substance and Sexual Activity   Alcohol use: Never   Drug use: Never   Sexual activity: Not Currently  Other Topics Concern   Not on file  Social History Narrative   Not on file   Social Determinants of Health   Financial Resource Strain: Low Risk  (05/12/2023)   Overall Financial Resource Strain (CARDIA)    Difficulty of Paying Living Expenses: Not hard at all  Food Insecurity: No Food Insecurity (08/11/2022)   Hunger Vital Sign    Worried About Running Out of Food in the Last Year: Never true    Ran Out of Food in the Last Year: Never true  Transportation Needs: No Transportation Needs (05/12/2023)   PRAPARE - Administrator, Civil Service (Medical): No    Lack of Transportation (Non-Medical): No  Physical Activity: Inactive (05/12/2023)   Exercise Vital Sign    Days of Exercise per Week: 0 days    Minutes of Exercise per Session: 0 min  Stress: No Stress Concern Present (05/12/2023)   [employer]  of Occupational Health - Occupational Stress Questionnaire    Feeling of Stress : Not at all  Social Connections: Socially Isolated (05/12/2023)   Social Connection and Isolation Panel [NHANES]    Frequency of Communication with Friends and Family: More than three times a week    Frequency of Social Gatherings with Friends and Family: More than three times a week    Attends Religious Services: Never    Database administrator or Organizations: No    Attends Banker Meetings: Never    Marital Status: Widowed    CONSTITUTIONAL: Negative for chills, fatigue, fever, unintentional weight gain and unintentional weight loss.  CARDIOVASCULAR: Negative for chest pain, dizziness, palpitations and pedal edema.  RESPIRATORY: Negative for recent cough and dyspnea.  GASTROINTESTINAL: Negative for abdominal pain, acid reflux symptoms, constipation, diarrhea, nausea and vomiting.  MSK: see HPI INTEGUMENTARY: Negative for rash.  NEUROLOGICAL: Negative for dizziness and headaches.  PSYCHIATRIC: Negative for sleep disturbance and to question depression screen.  Negative for depression, negative for anhedonia.       Objective:  PHYSICAL EXAM:   VS: BP (!) 98/58 (BP Location: Left Arm, Patient Position: Sitting, Cuff Size: Normal)   Pulse (!) 55   Temp 97.7 F (36.5 C) (Temporal)   Ht 4\' 11"  (1.499 m)   Wt 164 lb (74.4 kg)   SpO2 97%   BMI 33.12 kg/m  Weight is stable GEN: Well nourished, well developed, in no acute distress  Cardiac: RRR; no murmurs, rubs, or gallops,no edema - Respiratory:  normal respiratory rate and pattern with no distress - normal breath sounds with no rales, rhonchi, wheezes or rubs  MS: no deformity or atrophy  Psych: euthymic mood, appropriate affect and demeanor Diabetic Foot Exam - Simple   Simple Foot Form Diabetic Foot exam was performed with the following findings: Yes 05/31/2023 12:02 PM  Visual Inspection No deformities, no ulcerations, no other skin  breakdown bilaterally: Yes Sensation Testing Intact to touch and monofilament testing bilaterally: Yes Pulse Check Posterior Tibialis and Dorsalis pulse intact bilaterally: Yes Comments     Lab Results  Component Value Date   WBC 5.1 01/17/2023   HGB 14.5 01/17/2023   HCT 42.9 01/17/2023   PLT 295 01/17/2023   GLUCOSE 114 (  H) 01/17/2023   CHOL 107 01/17/2023   TRIG 128 01/17/2023   HDL 34 (L) 01/17/2023   LDLCALC 50 01/17/2023   ALT 25 01/17/2023   AST 22 01/17/2023   NA 141 01/17/2023   K 4.6 01/17/2023   CL 106 01/17/2023   CREATININE 0.66 01/17/2023   BUN 15 01/17/2023   CO2 20 01/17/2023   TSH 0.068 (L) 01/17/2023   HGBA1C 5.9 (H) 01/17/2023      Assessment & Plan:   Problem List Items Addressed This Visit       Cardiovascular and Mediastinum   Coronary artery disease involving native coronary artery of native heart without angina pectoris   Relevant Orders   Follow up with cardiologist as directed Continue meds   Essential hypertension   Relevant Orders   CBC with Differential/Platelet   Comprehensive metabolic panel   TSH Continue meds           Chronic low back pain with sciatica Referral to ortho              Endocrine   Type 2 diabetes mellitus with hyperglycemia, without long-term current use of insulin - Primary   Relevant Orders   Hemoglobin A1c Continue current meds              Other   Mixed hyperlipidemia   Relevant Orders   Lipid panel Continue med   Hypovitaminosis D   Relevant Orders   VITAMIN D 25 Hydroxy (Vit-D Deficiency, Fractures)                                               .  Meds ordered this encounter  Medications   alendronate (FOSAMAX) 70 MG tablet    Sig: Take 1 tablet (70 mg total) by mouth every 7 (seven) days. Take with a full glass of water on an empty stomach.    Dispense:  12 tablet    Refill:  1    Order Specific Question:   Supervising Provider    Answer:   Corey Harold    carvedilol (COREG) 12.5 MG tablet    Sig: Take 1 tablet (12.5 mg total) by mouth 2 (two) times daily with a meal.    Dispense:  180 tablet    Refill:  1    Order Specific Question:   Supervising Provider    Answer:   [facility], Aniceto Boss   Semaglutide (RYBELSUS) 7 MG TABS    Sig: Take 1 tablet (7 mg total) by mouth daily.    Dispense:  90 tablet    Refill:  1    Order Specific Question:   Supervising Provider    Answer:   [facility], Aniceto Boss   Vitamin D, Ergocalciferol, (DRISDOL) 1.25 MG (50000 UNIT) CAPS capsule    Sig: TAKE ONE CAPSULE BY MOUTH TWICE WEEKLY    Dispense:  24 capsule    Refill:  1    This prescription was filled on 04/05/2023. Any refills authorized will be placed on file.    Order Specific Question:   Supervising Provider    Answer:   Corey Harold   amLODipine (NORVASC) 5 MG tablet    Sig: Take 1 tablet (5 mg total) by mouth daily.    Dispense:  90 tablet    Refill:  1    Order Specific Question:  Supervising Provider    Answer:   Blane Ohara [[id number]]   pravastatin (PRAVACHOL) 80 MG tablet    Sig: Take 1 tablet (80 mg total) by mouth every evening.    Dispense:  90 tablet    Refill:  1    Order Specific Question:   Supervising Provider    AnswerCorey Harold    Orders Placed This Encounter  Procedures   CBC with Differential/Platelet   Comprehensive metabolic panel   TSH   Lipid panel   VITAMIN D 25 Hydroxy (Vit-D Deficiency, Fractures)   Hemoglobin A1c   Ambulatory referral to Orthopedic Surgery     Follow-up: Return in about 4 months (around 09/30/2023) for chronic fasting follow-up.  An After Visit Summary was printed and given to the patient.  Jettie Pagan [facility] [phone]

## 2023-06-01 ENCOUNTER — Other Ambulatory Visit: Payer: Self-pay | Admitting: Physician Assistant

## 2023-06-01 DIAGNOSIS — I2510 Atherosclerotic heart disease of native coronary artery without angina pectoris: Secondary | ICD-10-CM

## 2023-06-01 DIAGNOSIS — I10 Essential (primary) hypertension: Secondary | ICD-10-CM

## 2023-06-01 LAB — CBC WITH DIFFERENTIAL/PLATELET
Basophils Absolute: 0.1 10*3/uL (ref 0.0–0.2)
Basos: 1 %
EOS (ABSOLUTE): 0.1 10*3/uL (ref 0.0–0.4)
Eos: 2 %
Hematocrit: 43.7 % (ref 34.0–46.6)
Hemoglobin: 14.6 g/dL (ref 11.1–15.9)
Immature Grans (Abs): 0 10*3/uL (ref 0.0–0.1)
Immature Granulocytes: 0 %
Lymphocytes Absolute: 1.8 10*3/uL (ref 0.7–3.1)
Lymphs: 31 %
MCH: 29.7 pg (ref 26.6–33.0)
MCHC: 33.4 g/dL (ref 31.5–35.7)
MCV: 89 fL (ref 79–97)
Monocytes Absolute: 0.5 10*3/uL (ref 0.1–0.9)
Monocytes: 8 %
Neutrophils Absolute: 3.3 10*3/uL (ref 1.4–7.0)
Neutrophils: 58 %
Platelets: 262 10*3/uL (ref 150–450)
RBC: 4.91 x10E6/uL (ref 3.77–5.28)
RDW: 11.7 % (ref 11.7–15.4)
WBC: 5.8 10*3/uL (ref 3.4–10.8)

## 2023-06-01 LAB — COMPREHENSIVE METABOLIC PANEL
ALT: 21 IU/L (ref 0–32)
AST: 27 IU/L (ref 0–40)
Albumin: 4.3 g/dL (ref 3.7–4.7)
Alkaline Phosphatase: 65 IU/L (ref 44–121)
BUN/Creatinine Ratio: 20 (ref 12–28)
BUN: 14 mg/dL (ref 8–27)
Bilirubin Total: 0.7 mg/dL (ref 0.0–1.2)
CO2: 21 mmol/L (ref 20–29)
Calcium: 9.4 mg/dL (ref 8.7–10.3)
Chloride: 105 mmol/L (ref 96–106)
Creatinine, Ser: 0.69 mg/dL (ref 0.57–1.00)
Globulin, Total: 2.4 g/dL (ref 1.5–4.5)
Glucose: 90 mg/dL (ref 70–99)
Potassium: 5 mmol/L (ref 3.5–5.2)
Sodium: 141 mmol/L (ref 134–144)
Total Protein: 6.7 g/dL (ref 6.0–8.5)
eGFR: 87 mL/min/{1.73_m2} (ref >59)

## 2023-06-01 LAB — TSH: TSH: 0.224 u[IU]/mL — ABNORMAL LOW (ref 0.450–4.500)

## 2023-06-01 LAB — LIPID PANEL
Chol/HDL Ratio: 2.6 ratio (ref 0.0–4.4)
Cholesterol, Total: 112 mg/dL (ref 100–199)
HDL: 43 mg/dL (ref >39)
LDL Chol Calc (NIH): 44 mg/dL (ref 0–99)
Triglycerides: 144 mg/dL (ref 0–149)
VLDL Cholesterol Cal: 25 mg/dL (ref 5–40)

## 2023-06-01 LAB — HEMOGLOBIN A1C
Est. average glucose Bld gHb Est-mCnc: 120 mg/dL
Hgb A1c MFr Bld: 5.8 % — ABNORMAL HIGH (ref 4.8–5.6)

## 2023-06-01 LAB — VITAMIN D 25 HYDROXY (VIT D DEFICIENCY, FRACTURES): Vit D, 25-Hydroxy: 45.7 ng/mL (ref 30.0–100.0)

## 2023-06-02 ENCOUNTER — Other Ambulatory Visit: Payer: Self-pay | Admitting: Physician Assistant

## 2023-06-02 DIAGNOSIS — R42 Dizziness and giddiness: Secondary | ICD-10-CM

## 2023-06-08 ENCOUNTER — Other Ambulatory Visit: Payer: Self-pay

## 2023-06-08 MED ORDER — ONETOUCH VERIO VI STRP: ORAL_STRIP | 12 refills | Status: DC

## 2023-06-10 ENCOUNTER — Telehealth: Payer: Self-pay

## 2023-06-10 ENCOUNTER — Other Ambulatory Visit: Payer: Self-pay | Admitting: Physician Assistant

## 2023-06-10 MED ORDER — ONETOUCH VERIO VI STRP: ORAL_STRIP | 3 refills | Status: DC

## 2023-06-10 NOTE — Telephone Encounter (Signed)
[facility] called stating that the prescription that was sent over for the patient one touch verio test strips did not have a frequency of testing and they need a new script sent over to them with the frequency of how many times a day that the patient tests so they can bill the insurance.

## 2023-06-17 ENCOUNTER — Other Ambulatory Visit: Payer: Self-pay | Admitting: Physician Assistant

## 2023-06-17 DIAGNOSIS — R946 Abnormal results of thyroid function studies: Secondary | ICD-10-CM

## 2023-06-22 ENCOUNTER — Ambulatory Visit: Payer: Medicare HMO | Admitting: Internal Medicine

## 2023-06-22 NOTE — Progress Notes (Deleted)
Name: Tina Perkins  MRN/ DOB: [id number], 30-Mar-1941   Age/ Sex: 82 y.o., female    PCP: Marianne Sofia, Cordelia Poche   Reason for Endocrinology Evaluation: Type 2 Diabetes Mellitus/MNG     Date of Initial Endocrinology Visit: 06/22/2023     PATIENT IDENTIFIER: Tina Perkins is a 82 y.o. female with a past medical history of DM, HTN, dyslipidemia, PAT, and CAD. The patient presented for initial endocrinology clinic visit on 06/22/2023 for consultative assistance with her diabetes management.    HPI: Tina Perkins was    Diagnosed with DM 2021 Prior Medications tried/Intolerance: *** Currently checking blood sugars *** x / day,  before breakfast and ***.  Hypoglycemia episodes : ***               Symptoms: ***                 Frequency: ***/  Hemoglobin A1c has ranged from 5.8% in 2024, peaking at 7.2% in 2023.   In terms of diet, the patient ***   THYROID HISTORY: Patient has been noted with low TSH intermittently since 2021, with a nadir of 0.068u IU/mL in 01/2023 Thyroid ultrasound 01/2023 revealed multinodular goiter none met criteria for FNA at the time      HOME DIABETES REGIMEN: Rybelsus 7 mg daily    Statin: Yes ACE-I/ARB: No Prior Diabetic Education: {Yes/No:11203}   METER DOWNLOAD SUMMARY: Date range evaluated: *** Fingerstick Blood Glucose Tests = *** Average Number Tests/Day = *** Overall Mean FS Glucose = *** Standard Deviation = ***  BG Ranges: Low = *** High = ***   Hypoglycemic Events/30 Days: BG < 50 = *** Episodes of symptomatic severe hypoglycemia = ***   DIABETIC COMPLICATIONS: Microvascular complications:  *** Denies: *** Last eye exam: Completed   Macrovascular complications:  CAD Denies:  PVD, CVA   PAST HISTORY: Past Medical History:  Past Medical History:  Diagnosis Date   Acute respiratory disease due to COVID-19 virus 08/12/2019   BMI 35.0-35.9,adult 03/31/2020   Chest pain, precordial 04/10/2020   Chronic bilateral low back  pain with bilateral sciatica 09/15/2021   Last Assessment & Plan:  Formatting of this note might be different from the original. 82 year old female seen today in initial consultation for her chronic back pain with bilateral lower extremity radiculopathy, left greater than right.    Will request previous lumbar spine MRI for review.  At this time, I will start patient on gabapentin 100 mg q.h.s. x2 weeks then increase her to 200 mg q.h.s.    Coronary artery disease involving native coronary artery of native heart without angina pectoris 05/10/2016   Apparently coronary intervention done in 1993 in [place] She was fine to have 75% narrowing of the mid LAD, nitroglycerin was given reduction to 30-40% it was also described as bridging in that area   CVA (cerebral vascular accident) (HCC)    2020   Daytime somnolence 09/15/2020   Elevated blood pressure 09/15/2021   Last Assessment & Plan:  Formatting of this note might be different from the original. Blood pressure elevated in clinic today.  Patient will continue to follow-up with his primary care provider for further management.   Essential hypertension 04/10/2020   History of hepatitis 12/14/2019   Hypovitaminosis D 10/29/2021   Mixed hyperlipidemia 12/14/2019   Neck pain 09/15/2021   Last Assessment & Plan:  Formatting of this note might be different from the original. Atraumatic, acute onset cervical spine pain and stiffness x 2-3  weeks which has been progressive without improvement.  No associated fevers or chills.  Associated headaches, left shoulder, and left upper extremity pain that radiates down into her hand and fingers. This pain is most likely radicular in nature.      Obesity (BMI 30-39.9) 04/10/2020   Pain medication agreement 09/15/2021   Last Assessment & Plan:  Formatting of this note might be different from the original. Agreement signed and placed in the patient's chart today..  UDS to be collected in compliance with clinic policies  and procedures.   Patient did not display any signs of abuse or diversion and has been checked on the [facility] Controlled Substance website.   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 Added automatically from request for surgery [id number] Formatting of this note might be different from the original. Added automatically from request for surgery [id number]   Past Surgical History:  Past Surgical History:  Procedure Laterality Date   CESAREAN SECTION N/A    COLON SURGERY     TOTAL KNEE ARTHROPLASTY Bilateral    2017 and 2019    Social History:  reports that she has never smoked. She has never been exposed to tobacco smoke. She has never used smokeless tobacco. She reports that she does not drink alcohol and does not use drugs. Family History:  Family History  Problem Relation Age of Onset   Cancer Mother    Heart attack Mother    Heart attack Father    Cancer Father    Cancer Sister    Cancer Brother    Cancer Daughter      HOME MEDICATIONS: Allergies as of 06/22/2023       Reactions   Sulfa Antibiotics Hives   Metformin And Related Nausea And Vomiting   Hallucinations and bring her down.        Medication List        Accurate as of June 22, 2023  7:30 AM. If you have any questions, ask your nurse or doctor.          alendronate 70 MG tablet Commonly known as: FOSAMAX Take 1 tablet (70 mg total) by mouth every 7 (seven) days. Take with a full glass of water on an empty stomach.   amLODipine 5 MG tablet Commonly known as: NORVASC Take 1 tablet (5 mg  total) by mouth daily.   aspirin EC 81 MG tablet Take 81 mg by mouth daily. Swallow whole.   blood glucose meter kit and supplies Kit Dispense based on patient and insurance preference. Use up to four times daily as directed.   carvedilol 12.5 MG tablet Commonly known as: COREG TAKE ONE (1) TABLET BY MOUTH TWICE DAILY WITH MEALS *REFILL REQUEST*   meclizine 12.5 MG tablet Commonly known as: ANTIVERT TAKE 1 TABLET BY MOUTH THREE TIMES DAILY AS NEEDED FOR DIZZINESS   nitroGLYCERIN 0.4 MG SL tablet Commonly known as: NITROSTAT Place 1 tablet (0.4 mg total) under the tongue every 5 (five) minutes as needed for chest pain.   ondansetron 4 MG tablet Commonly known as: Zofran Take 1 tablet (  4 mg total) by mouth every 8 (eight) hours as needed for nausea or vomiting.   onetouch ultrasoft lancets Use as instructed   OneTouch Verio Flex System w/Device Kit Check Glucose fasting and 2 hours after meals   OneTouch Verio test strip Generic drug: glucose blood Check glucose bid   pravastatin 80 MG tablet Commonly known as: PRAVACHOL Take 1 tablet (80 mg total) by mouth every evening.   Rybelsus 7 MG Tabs Generic drug: Semaglutide Take 1 tablet (7 mg total) by mouth daily.   traMADol 50 MG tablet Commonly known as: ULTRAM Take 50 mg by mouth 3 (three) times daily as needed.   Vitamin D (Ergocalciferol) 1.25 MG (50000 UNIT) Caps capsule Commonly known as: DRISDOL TAKE ONE CAPSULE BY MOUTH TWICE WEEKLY         ALLERGIES: Allergies  Allergen Reactions   Sulfa Antibiotics Hives   Metformin And Related Nausea And Vomiting    Hallucinations and bring her down.     REVIEW OF SYSTEMS: A comprehensive ROS was conducted with the patient and is negative except as per HPI and below:  ROS    OBJECTIVE:   VITAL SIGNS: There were no vitals taken for this visit.   PHYSICAL EXAM:  General: Pt appears well and is in NAD  Neck: General: Supple without adenopathy or carotid  bruits. Thyroid: Thyroid size normal.  No goiter or nodules appreciated.   Lungs: Clear with good BS bilat   Heart: RRR   Abdomen:  soft, nontender  Extremities:  Lower extremities - No pretibial edema.   Skin:  No rash noted.  Neuro: MS is good with appropriate affect, pt is alert and Ox3    DM foot exam:    DATA REVIEWED:  Lab Results  Component Value Date   HGBA1C 5.8 (H) 05/31/2023   HGBA1C 5.9 (H) 01/17/2023   HGBA1C 7.2 (H) 09/17/2022   Lab Results  Component Value Date   LDLCALC 44 05/31/2023   CREATININE 0.69 05/31/2023   Lab Results  Component Value Date   MICRALBCREAT 14 09/17/2022    Lab Results  Component Value Date   CHOL 112 05/31/2023   HDL 43 05/31/2023   LDLCALC 44 05/31/2023   TRIG 144 05/31/2023   CHOLHDL 2.6 05/31/2023        ASSESSMENT / PLAN / RECOMMENDATIONS:   1) Type *** Diabetes Mellitus, ***controlled, With macrovascular complications - Most recent A1c of *** %. Goal A1c < *** %.  ***  Plan: GENERAL: ***  MEDICATIONS: ***  EDUCATION / INSTRUCTIONS: BG monitoring instructions: Patient is instructed to check her blood sugars *** times a day, ***. Call [facility] if: BG persistently < 70  I reviewed the Rule of 15 for the treatment of hypoglycemia in detail with the patient. Literature supplied.   2) Diabetic complications:  Eye: Does *** have known diabetic retinopathy.  Neuro/ Feet: Does *** have known diabetic peripheral neuropathy. Renal: Patient does *** have known baseline CKD. She is *** on an ACEI/ARB at present.  3) Lipids: Patient is *** on a statin.    4) Hypertension: ***  at goal of < 140/90 mmHg.       Signed electronically by: Lyndle Herrlich, MD  [facility]  [facility] [address] [place] [place], [place] [address] Phone: [phone] FAX: [phone]   CC: Jannifer Hick [address] [place] [address] Phone: [phone]   Fax: [phone]  Return to Endocrinology clinic as below: Future Appointments  Date Time Provider Department Center  06/22/2023  2:00 PM Raiven Belizaire, Konrad Dolores, MD [facility] None  09/30/2023 11:20 AM Marianne Sofia, PA-C [facility] None  05/17/2024 10:00 AM Marianne Sofia, PA-C [facility] None

## 2023-09-30 ENCOUNTER — Ambulatory Visit: Payer: Medicare HMO | Admitting: Physician Assistant

## 2023-09-30 ENCOUNTER — Encounter: Payer: Self-pay | Admitting: Physician Assistant

## 2023-09-30 VITALS — BP 104/70 | HR 56 | Temp 96.6°F | Resp 14 | Ht 59.0 in | Wt 165.0 lb

## 2023-09-30 DIAGNOSIS — E039 Hypothyroidism, unspecified: Secondary | ICD-10-CM | POA: Insufficient documentation

## 2023-09-30 DIAGNOSIS — M5441 Lumbago with sciatica, right side: Secondary | ICD-10-CM | POA: Diagnosis not present

## 2023-09-30 DIAGNOSIS — M5442 Lumbago with sciatica, left side: Secondary | ICD-10-CM

## 2023-09-30 DIAGNOSIS — I152 Hypertension secondary to endocrine disorders: Secondary | ICD-10-CM | POA: Diagnosis not present

## 2023-09-30 DIAGNOSIS — E1159 Type 2 diabetes mellitus with other circulatory complications: Secondary | ICD-10-CM | POA: Diagnosis not present

## 2023-09-30 DIAGNOSIS — J060 Acute laryngopharyngitis: Secondary | ICD-10-CM

## 2023-09-30 DIAGNOSIS — E1169 Type 2 diabetes mellitus with other specified complication: Secondary | ICD-10-CM

## 2023-09-30 DIAGNOSIS — E1165 Type 2 diabetes mellitus with hyperglycemia: Secondary | ICD-10-CM

## 2023-09-30 DIAGNOSIS — G8929 Other chronic pain: Secondary | ICD-10-CM

## 2023-09-30 DIAGNOSIS — E559 Vitamin D deficiency, unspecified: Secondary | ICD-10-CM | POA: Diagnosis not present

## 2023-09-30 DIAGNOSIS — I4719 Other supraventricular tachycardia: Secondary | ICD-10-CM

## 2023-09-30 DIAGNOSIS — I2510 Atherosclerotic heart disease of native coronary artery without angina pectoris: Secondary | ICD-10-CM

## 2023-09-30 DIAGNOSIS — G63 Polyneuropathy in diseases classified elsewhere: Secondary | ICD-10-CM | POA: Diagnosis not present

## 2023-09-30 MED ORDER — AMOXICILLIN 875 MG PO TABS
875.0000 mg | ORAL_TABLET | Freq: Two times a day (BID) | ORAL | 0 refills | Status: AC
Start: 2023-09-30 — End: 2023-10-10

## 2023-09-30 MED ORDER — BENZONATATE 100 MG PO CAPS
100.0000 mg | ORAL_CAPSULE | Freq: Two times a day (BID) | ORAL | 0 refills | Status: DC | PRN
Start: 2023-09-30 — End: 2024-01-26

## 2023-09-30 NOTE — Progress Notes (Signed)
Subjective:  Patient ID: Tina Perkins, female    DOB: 1941-03-14  Age: 82 y.o. MRN: [id number]  Chief Complaint  Patient presents with   Medical Management of Chronic Issues    Diabetes  Hyperlipidemia  Hypertension    Pt presents for follow up of hypertension. Patient was diagnosed in 2010 The patient is tolerating the medication well without side effects. Compliance with treatment has been good; including taking medication as directed , maintains a healthy diet and  and following up as directed.currently taking norvasc 5mg  qd and coreg 12.5mg  bid Pt also with history of PAT and CAD - pt follows with cardiologist and has appt in a few months Denies chest pain, dyspnea, edema  Mixed hyperlipidemia  Pt presents with hyperlipidemia.  Compliance with treatment has been good The patient is compliant with medications, maintains a low cholesterol diet , follows up as directed ,  . The patient denies experiencing any hypercholesterolemia related symptoms. She is currently on pravachol 80mg  qd  Pt with history of vitamin D def - currently on weekly supplement - due for labwork  Pt with history of cervical spondylosis with radiculopathy, chronic low back pain with sciatica and history of chronic knee pain with knee replacement - pt states that she has not followed up with ortho in over a year She is hesitant to schedule but says she cannot handle her pain anymore (has history of getting shots, going to pain management etc) - is currently on tramadol as needed She has not had MRI of lower back since 2019 --- she does agree however to make referral back to [facility] and that was done in August but she states she did not hear back - will make another referral  Pt with diabetes.  Pt states she is taking rybelsus 7mg  qd and tolerating med well.  Says glucose range usually around 100 fasting - voices no problems or concerns Is due for eye exam  Pt complains of sore throat, chest congestion  and productive cough for the past week Denies fever, chest pain or dyspnea  Current Outpatient Medications on File Prior to Visit  Medication Sig Dispense Refill   alendronate (FOSAMAX) 70 MG tablet Take 1 tablet (70 mg total) by mouth every 7 (seven) days. Take with a full glass of water on an empty stomach. 12 tablet 1   amLODipine (NORVASC) 5 MG tablet Take 1 tablet (5 mg total) by mouth daily. 90 tablet 1   aspirin EC 81 MG tablet Take 81 mg by mouth daily. Swallow whole.     blood glucose meter kit and supplies KIT Dispense based on patient and insurance preference. Use up to four times daily as directed. 1 each 0   Blood Glucose Monitoring Suppl (ONETOUCH VERIO FLEX SYSTEM) w/Device KIT Check Glucose fasting and 2 hours after meals 1 kit 0   carvedilol (COREG) 12.5 MG tablet TAKE ONE (1) TABLET BY MOUTH TWICE DAILY WITH MEALS *REFILL REQUEST* 60 tablet 10   glucose blood (ONETOUCH VERIO) test strip Check glucose bid 300 each 3   Lancets (ONETOUCH ULTRASOFT) lancets Use as instructed 100 each 12   meclizine (ANTIVERT) 12.5 MG tablet TAKE 1 TABLET BY MOUTH THREE TIMES DAILY AS NEEDED FOR DIZZINESS 30 tablet 10   ondansetron (ZOFRAN) 4 MG tablet Take 1 tablet (4 mg total) by mouth every 8 (eight) hours as needed for nausea or vomiting. 20 tablet 0   pravastatin (PRAVACHOL) 80 MG tablet Take 1 tablet (80 mg total)  by mouth every evening. 90 tablet 1   Semaglutide (RYBELSUS) 7 MG TABS Take 1 tablet (7 mg total) by mouth daily. 90 tablet 1   traMADol (ULTRAM) 50 MG tablet Take 50 mg by mouth 3 (three) times daily as needed.     Vitamin D, Ergocalciferol, (DRISDOL) 1.25 MG (50000 UNIT) CAPS capsule TAKE ONE CAPSULE BY MOUTH TWICE WEEKLY 24 capsule 1   nitroGLYCERIN (NITROSTAT) 0.4 MG SL tablet Place 1 tablet (0.4 mg total) under the tongue every 5 (five) minutes as needed for chest pain. 90 tablet 3   No current facility-administered medications on file prior to visit.   Past Medical History:   Diagnosis Date   Acute respiratory disease due to COVID-19 virus 08/12/2019   BMI 35.0-35.9,adult 03/31/2020   Chest pain, precordial 04/10/2020   Chronic bilateral low back pain with bilateral sciatica 09/15/2021   Last Assessment & Plan:  Formatting of this note might be different from the original. 82 year old female seen today in initial consultation for her chronic back pain with bilateral lower extremity radiculopathy, left greater than right.    Will request previous lumbar spine MRI for review.  At this time, I will start patient on gabapentin 100 mg q.h.s. x2 weeks then increase her to 200 mg q.h.s.    Coronary artery disease involving native coronary artery of native heart without angina pectoris 05/10/2016   Apparently coronary intervention done in 1993 in [place] She was fine to have 75% narrowing of the mid LAD, nitroglycerin was given reduction to 30-40% it was also described as bridging in that area   CVA (cerebral vascular accident) (HCC)    2020   Daytime somnolence 09/15/2020   Elevated blood pressure 09/15/2021   Last Assessment & Plan:  Formatting of this note might be different from the original. Blood pressure elevated in clinic today.  Patient will continue to follow-up with his primary care provider for further management.   Essential hypertension 04/10/2020   History of hepatitis 12/14/2019   Hypovitaminosis D 10/29/2021   Mixed hyperlipidemia 12/14/2019   Neck pain 09/15/2021   Last Assessment & Plan:  Formatting of this note might be different from the original. Atraumatic, acute onset cervical spine pain and stiffness x 2-3 weeks which has been progressive without improvement.  No associated fevers or chills.  Associated headaches, left shoulder, and left upper extremity pain that radiates down into her hand and fingers. This pain is most likely radicular in nature.      Obesity (BMI 30-39.9) 04/10/2020   Pain medication agreement 09/15/2021   Last Assessment &  Plan:  Formatting of this note might be different from the original. Agreement signed and placed in the patient's chart today..  UDS to be collected in compliance with clinic policies and procedures.   Patient did not display any signs of abuse or diversion and has been checked on the [facility] Controlled Substance website.   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 Added automatically from request for surgery [id number] Formatting of this note might be different from the original. Added automatically from  request for surgery [phone]   Past Surgical History:  Procedure Laterality Date   CESAREAN SECTION N/A    COLON SURGERY     TOTAL KNEE ARTHROPLASTY Bilateral    2017 and 2019    Family History  Problem Relation Age of Onset   Cancer Mother    Heart attack Mother    Heart attack Father    Cancer Father    Cancer Sister    Cancer Brother    Cancer Daughter    Social History   Socioeconomic History   Marital status: Widowed    Spouse name: Not on file   Number of children: 11   Years of education: 9   Highest education level: 9th grade  Occupational History   Occupation: Retired  Tobacco Use   Smoking status: Never    Passive exposure: Never   Smokeless tobacco: Never  Vaping Use   Vaping status: Never Used  Substance and Sexual Activity   Alcohol use: Never   Drug use: Never   Sexual activity: Not Currently  Other Topics Concern   Not on file  Social History Narrative   Not on file   Social Drivers of Health   Financial Resource Strain: Low Risk   (05/12/2023)   Overall Financial Resource Strain (CARDIA)    Difficulty of Paying Living Expenses: Not hard at all  Food Insecurity: No Food Insecurity (08/11/2022)   Hunger Vital Sign    Worried About Running Out of Food in the Last Year: Never true    Ran Out of Food in the Last Year: Never true  Transportation Needs: No Transportation Needs (05/12/2023)   PRAPARE - Administrator, Civil Service (Medical): No    Lack of Transportation (Non-Medical): No  Physical Activity: Inactive (05/12/2023)   Exercise Vital Sign    Days of Exercise per Week: 0 days    Minutes of Exercise per Session: 0 min  Stress: No Stress Concern Present (05/12/2023)   [employer] of Occupational Health - Occupational Stress Questionnaire    Feeling of Stress : Not at all  Social Connections: Socially Isolated (05/12/2023)   Social Connection and Isolation Panel [NHANES]    Frequency of Communication with Friends and Family: More than three times a week    Frequency of Social Gatherings with Friends and Family: More than three times a week    Attends Religious Services: Never    Database administrator or Organizations: No    Attends Banker Meetings: Never    Marital Status: Widowed    CONSTITUTIONAL: see HPI E/N/T: see HPI CARDIOVASCULAR: Negative for chest pain, dizziness, palpitations and pedal edema.  RESPIRATORY: see HPI GASTROINTESTINAL: Negative for abdominal pain, acid reflux symptoms, constipation, diarrhea, nausea and vomiting.   INTEGUMENTARY: Negative for rash.   PSYCHIATRIC: Negative for sleep disturbance and to question depression screen.  Negative for depression, negative for anhedonia.       Objective:  PHYSICAL EXAM:   VS: BP 104/70   Pulse (!) 56   Temp (!) 96.6 F (35.9 C)   Resp 14   Ht 4\' 11"  (1.499 m)   Wt 165 lb (74.8 kg)   SpO2 97%   BMI 33.33 kg/m   GEN: Well nourished, well developed, in no acute distress  HEENT: normal external ears and nose  - normal external auditory canals and TMS  - Lips, Teeth and Gums - normal  Oropharynx - erythema/pnd Cardiac: RRR; no murmurs, Respiratory:  faint  rhonchi/clears with cough  Psych: euthymic mood, appropriate affect and demeanor  Diabetic Foot Exam - Simple   No data filed     Lab Results  Component Value Date   WBC 5.8 05/31/2023   HGB 14.6 05/31/2023   HCT 43.7 05/31/2023   PLT 262 05/31/2023   GLUCOSE 90 05/31/2023   CHOL 112 05/31/2023   TRIG 144 05/31/2023   HDL 43 05/31/2023   LDLCALC 44 05/31/2023   ALT 21 05/31/2023   AST 27 05/31/2023   NA 141 05/31/2023   K 5.0 05/31/2023   CL 105 05/31/2023   CREATININE 0.69 05/31/2023   BUN 14 05/31/2023   CO2 21 05/31/2023   TSH 0.224 (L) 05/31/2023   HGBA1C 5.8 (H) 05/31/2023      Assessment & Plan:   Problem List Items Addressed This Visit       Cardiovascular and Mediastinum   Coronary artery disease involving native coronary artery of native heart without angina pectoris   Relevant Orders   Follow up with cardiologist as directed Continue meds   Hypertension associated with diabetes (HCC)   Relevant Orders   CBC with Differential/Platelet   Comprehensive metabolic panel   TSH Continue meds           Chronic low back pain with sciatica Referral to ortho              Endocrine   Type 2 diabetes mellitus with hyperglycemia, without long-term current use of insulin - Primary   Relevant Orders   Hemoglobin A1c Continue current meds              Other   Hyperlipidemia associated with diabetes (HCC)   Relevant Orders   Lipid panel Continue med   Hypovitaminosis D   Relevant Orders   VITAMIN D 25 Hydroxy (Vit-D Deficiency, Fractures)      [facility] Rx for amoxil 875mg  bid Rx for tessalon perles                                         .  Meds ordered this encounter  Medications   amoxicillin (AMOXIL) 875 MG tablet    Sig: Take 1 tablet (875 mg total) by mouth 2 (two) times daily  for 10 days.    Dispense:  20 tablet    Refill:  0    Supervising Provider:   COX, KIRSTEN [[id number]]   benzonatate (TESSALON) 100 MG capsule    Sig: Take 1 capsule (100 mg total) by mouth 2 (two) times daily as needed for cough.    Dispense:  20 capsule    Refill:  0    Supervising Provider:   Blane Ohara [phone]    Orders Placed This Encounter  Procedures   CBC with Differential/Platelet   Comprehensive metabolic panel   TSH   Lipid panel   Hemoglobin A1c   VITAMIN D 25 Hydroxy (Vit-D Deficiency, Fractures)   Ambulatory referral to Orthopedic Surgery     Follow-up: Return in about 4 months (around 01/29/2024) for chronic fasting follow-up.  An After Visit Summary was printed and given to the patient.  Jettie Pagan [facility] [phone]

## 2023-10-01 LAB — COMPREHENSIVE METABOLIC PANEL
ALT: 22 [IU]/L (ref 0–32)
AST: 21 [IU]/L (ref 0–40)
Albumin: 4.1 g/dL (ref 3.7–4.7)
Alkaline Phosphatase: 68 [IU]/L (ref 44–121)
BUN/Creatinine Ratio: 22 (ref 12–28)
BUN: 14 mg/dL (ref 8–27)
Bilirubin Total: 0.4 mg/dL (ref 0.0–1.2)
CO2: 22 mmol/L (ref 20–29)
Calcium: 9.1 mg/dL (ref 8.7–10.3)
Chloride: 106 mmol/L (ref 96–106)
Creatinine, Ser: 0.65 mg/dL (ref 0.57–1.00)
Globulin, Total: 2.6 g/dL (ref 1.5–4.5)
Glucose: 104 mg/dL — ABNORMAL HIGH (ref 70–99)
Potassium: 4.5 mmol/L (ref 3.5–5.2)
Sodium: 142 mmol/L (ref 134–144)
Total Protein: 6.7 g/dL (ref 6.0–8.5)
eGFR: 88 mL/min/{1.73_m2} (ref >59)

## 2023-10-01 LAB — HEMOGLOBIN A1C
Est. average glucose Bld gHb Est-mCnc: 114 mg/dL
Hgb A1c MFr Bld: 5.6 % (ref 4.8–5.6)

## 2023-10-01 LAB — CBC WITH DIFFERENTIAL/PLATELET
Basophils Absolute: 0 10*3/uL (ref 0.0–0.2)
Basos: 0 %
EOS (ABSOLUTE): 0.2 10*3/uL (ref 0.0–0.4)
Eos: 2 %
Hematocrit: 40 % (ref 34.0–46.6)
Hemoglobin: 13.1 g/dL (ref 11.1–15.9)
Immature Grans (Abs): 0 10*3/uL (ref 0.0–0.1)
Immature Granulocytes: 0 %
Lymphocytes Absolute: 2.1 10*3/uL (ref 0.7–3.1)
Lymphs: 29 %
MCH: 29.4 pg (ref 26.6–33.0)
MCHC: 32.8 g/dL (ref 31.5–35.7)
MCV: 90 fL (ref 79–97)
Monocytes Absolute: 0.6 10*3/uL (ref 0.1–0.9)
Monocytes: 9 %
Neutrophils Absolute: 4.1 10*3/uL (ref 1.4–7.0)
Neutrophils: 60 %
Platelets: 211 10*3/uL (ref 150–450)
RBC: 4.45 x10E6/uL (ref 3.77–5.28)
RDW: 11.5 % — ABNORMAL LOW (ref 11.7–15.4)
WBC: 7 10*3/uL (ref 3.4–10.8)

## 2023-10-01 LAB — LIPID PANEL
Chol/HDL Ratio: 2.6 {ratio} (ref 0.0–4.4)
Cholesterol, Total: 115 mg/dL (ref 100–199)
HDL: 45 mg/dL (ref >39)
LDL Chol Calc (NIH): 46 mg/dL (ref 0–99)
Triglycerides: 136 mg/dL (ref 0–149)
VLDL Cholesterol Cal: 24 mg/dL (ref 5–40)

## 2023-10-01 LAB — TSH: TSH: 0.43 u[IU]/mL — ABNORMAL LOW (ref 0.450–4.500)

## 2023-10-01 LAB — VITAMIN D 25 HYDROXY (VIT D DEFICIENCY, FRACTURES): Vit D, 25-Hydroxy: 59.5 ng/mL (ref 30.0–100.0)

## 2023-10-03 ENCOUNTER — Other Ambulatory Visit: Payer: Self-pay | Admitting: Physician Assistant

## 2023-10-03 DIAGNOSIS — E039 Hypothyroidism, unspecified: Secondary | ICD-10-CM

## 2023-10-11 DIAGNOSIS — Z008 Encounter for other general examination: Secondary | ICD-10-CM | POA: Diagnosis not present

## 2023-10-24 ENCOUNTER — Other Ambulatory Visit: Payer: Self-pay

## 2023-10-24 MED ORDER — ALENDRONATE SODIUM 70 MG PO TABS: 70.0000 mg | ORAL_TABLET | ORAL | 1 refills | Status: DC

## 2023-10-26 ENCOUNTER — Other Ambulatory Visit: Payer: Self-pay | Admitting: Physician Assistant

## 2023-11-15 ENCOUNTER — Other Ambulatory Visit: Payer: Self-pay | Admitting: Physician Assistant

## 2023-12-01 ENCOUNTER — Ambulatory Visit: Payer: Medicare HMO

## 2023-12-01 DIAGNOSIS — E039 Hypothyroidism, unspecified: Secondary | ICD-10-CM | POA: Diagnosis not present

## 2023-12-02 LAB — TSH: TSH: 0.528 u[IU]/mL (ref 0.450–4.500)

## 2024-01-18 ENCOUNTER — Other Ambulatory Visit: Payer: Self-pay | Admitting: Cardiology

## 2024-01-18 ENCOUNTER — Other Ambulatory Visit: Payer: Self-pay | Admitting: Physician Assistant

## 2024-01-18 DIAGNOSIS — E782 Mixed hyperlipidemia: Secondary | ICD-10-CM

## 2024-01-18 DIAGNOSIS — I10 Essential (primary) hypertension: Secondary | ICD-10-CM

## 2024-01-18 DIAGNOSIS — E1165 Type 2 diabetes mellitus with hyperglycemia: Secondary | ICD-10-CM

## 2024-01-18 DIAGNOSIS — E559 Vitamin D deficiency, unspecified: Secondary | ICD-10-CM

## 2024-01-19 ENCOUNTER — Other Ambulatory Visit: Payer: Self-pay

## 2024-01-26 ENCOUNTER — Ambulatory Visit: Admitting: Family Medicine

## 2024-01-26 ENCOUNTER — Encounter: Payer: Self-pay | Admitting: Family Medicine

## 2024-01-26 VITALS — BP 118/70 | HR 63 | Resp 16 | Ht 59.0 in | Wt 164.4 lb

## 2024-01-26 DIAGNOSIS — L270 Generalized skin eruption due to drugs and medicaments taken internally: Secondary | ICD-10-CM

## 2024-01-26 MED ORDER — TRIAMCINOLONE ACETONIDE 0.1 % EX CREA: 1.0000 | TOPICAL_CREAM | Freq: Two times a day (BID) | CUTANEOUS | 0 refills | Status: DC

## 2024-01-26 NOTE — Progress Notes (Signed)
 Acute Office Visit  Subjective:    Patient ID: Tina Perkins, female    DOB: May 10, 1941, 83 y.o.   MRN: [id number]  Chief Complaint  Patient presents with   Rash    Right upper arm    Discussed the use of AI scribe software for clinical note transcription with the patient, who gave verbal consent to proceed.  History of Present Illness   Tina Perkins is an 83 year old female who presents with a recurrent rash and itching.  She has been experiencing a recurrent rash that appears spontaneously and unpredictably on her fingers, hands, arms, back of the neck, spine, and waist, but not on her legs. The rash is characterized by swelling, particularly in the fingers, and the formation of large bubbles. It can last from a few hours to a couple of days before disappearing and reappearing in different locations. This issue has persisted for a couple of years.  She suspects the rash may be a reaction to medication, as she has a known allergy to sulfa drugs. Her current medications include Fosamax , amlodipine , aspirin, carvedilol , pravastatin , Rybelsus , tramadol, and vitamin D . She has been on amlodipine  for a couple of years. She uses Benadryl when the itching becomes unbearable.  She has not consulted a dermatologist for this issue but was previously given a fungal cream by another doctor, which she felt was ineffective. The rash and itching are described as 'driving me insane', indicating a significant impact on her quality of life.     Past Medical History:  Diagnosis Date   Acute respiratory disease due to COVID-19 virus 08/12/2019   BMI 35.0-35.9,adult 03/31/2020   Chest pain, precordial 04/10/2020   Chronic bilateral low back pain with bilateral sciatica 09/15/2021   Last Assessment & Plan:  Formatting of this note might be different from the original. 83 year old female seen today in initial consultation for her chronic back pain with bilateral lower extremity radiculopathy, left greater  than right.    Will request previous lumbar spine MRI for review.  At this time, I will start patient on gabapentin 100 mg q.h.s. x2 weeks then increase her to 200 mg q.h.s.    Coronary artery disease involving native coronary artery of native heart without angina pectoris 05/10/2016   Apparently coronary intervention done in 1993 in [place]  She was fine to have 75% narrowing of the mid LAD, nitroglycerin  was given reduction to 30-40% it was also described as bridging in that area   CVA (cerebral vascular accident) (HCC)    2020   Daytime somnolence 09/15/2020   Elevated blood pressure 09/15/2021   Last Assessment & Plan:  Formatting of this note might be different from the original. Blood pressure elevated in clinic today.  Patient will continue to follow-up with his primary care provider for further management.   Essential hypertension 04/10/2020   History of hepatitis 12/14/2019   Hypovitaminosis D 10/29/2021   Mixed hyperlipidemia 12/14/2019   Neck pain 09/15/2021   Last Assessment & Plan:  Formatting of this note might be different from the original. Atraumatic, acute onset cervical spine pain and stiffness x 2-3 weeks which has been progressive without improvement.  No associated fevers or chills.  Associated headaches, left shoulder, and left upper extremity pain that radiates down into her hand and fingers. This pain is most likely radicular in nature.      Obesity (BMI 30-39.9) 04/10/2020   Pain medication agreement 09/15/2021   Last Assessment & Plan:  Formatting of this  note might be different from the original. Agreement signed and placed in the patient's chart today..  UDS to be collected in compliance with clinic policies and procedures.   Patient did not display any signs of abuse or diversion and has been checked on the [place]  Controlled Substance website.   Patent foramen ovale 12/14/2019   Pneumonia due to COVID-19 virus 08/12/2019   Pneumonia due to COVID-19 virus  08/12/2019   Prediabetes 12/28/2019   Pruritus 12/14/2019   Second degree AV block, Mobitz type I 09/15/2020   Sequelae of cerebral infarction 12/14/2019   Shortness of breath 04/10/2020   Snoring 09/15/2020   Spinal stenosis, lumbar region with neurogenic claudication 02/28/2019   Formatting of this note might be different from the original. Added automatically from request for surgery [id number] Formatting of this note might be different from the original. Added automatically from request for surgery [id number]    Past Surgical History:  Procedure Laterality Date   CESAREAN SECTION N/A    COLON SURGERY     TOTAL KNEE ARTHROPLASTY Bilateral    2017 and 2019    Family History  Problem Relation Age of Onset   Cancer Mother    Heart attack Mother    Heart attack Father    Cancer Father    Cancer Sister    Cancer Brother    Cancer Daughter     Social History   Socioeconomic History   Marital status: Widowed    Spouse name: Not on file   Number of children: 11   Years of education: 9   Highest education level: 9th grade  Occupational History   Occupation: Retired  Tobacco Use   Smoking status: Never    Passive exposure: Never   Smokeless tobacco: Never  Vaping Use   Vaping status: Never Used  Substance and Sexual Activity   Alcohol use: Never   Drug use: Never   Sexual activity: Not Currently  Other Topics Concern   Not on file  Social History Narrative   Not on file   Social Drivers of Health   Financial Resource Strain: Low Risk  (05/12/2023)   Overall Financial Resource Strain (CARDIA)    Difficulty of Paying Living Expenses: Not hard at all  Food Insecurity: No Food Insecurity (08/11/2022)   Hunger Vital Sign    Worried About Running Out of Food in the Last Year: Never true    Ran Out of Food in the Last Year: Never true  Transportation Needs: No Transportation Needs (05/12/2023)   PRAPARE - Administrator, Civil Service (Medical): No    Lack of  Transportation (Non-Medical): No  Physical Activity: Inactive (05/12/2023)   Exercise Vital Sign    Days of Exercise per Week: 0 days    Minutes of Exercise per Session: 0 min  Stress: No Stress Concern Present (05/12/2023)   [employer] of Occupational Health - Occupational Stress Questionnaire    Feeling of Stress : Not at all  Social Connections: Socially Isolated (05/12/2023)   Social Connection and Isolation Panel [NHANES]    Frequency of Communication with Friends and Family: More than three times a week    Frequency of Social Gatherings with Friends and Family: More than three times a week    Attends Religious Services: Never    Database administrator or Organizations: No    Attends Banker Meetings: Never    Marital Status: Widowed  Intimate Partner Violence: Not At Risk (05/12/2023)  Humiliation, Afraid, Rape, and Kick questionnaire    Fear of Current or Ex-Partner: No    Emotionally Abused: No    Physically Abused: No    Sexually Abused: No    Outpatient Medications Prior to Visit  Medication Sig Dispense Refill   alendronate  (FOSAMAX ) 70 MG tablet Take 1 tablet (70 mg total) by mouth every 7 (seven) days. Take with a full glass of water on an empty stomach. 12 tablet 1   amLODipine  (NORVASC ) 5 MG tablet TAKE 1 TABLET BY MOUTH ONCE DAILY 30 tablet 0   aspirin EC 81 MG tablet Take 81 mg by mouth daily. Swallow whole.     blood glucose meter kit and supplies KIT Dispense based on patient and insurance preference. Use up to four times daily as directed. 1 each 0   Blood Glucose Monitoring Suppl (ONETOUCH VERIO FLEX SYSTEM) w/Device KIT Check Glucose fasting and 2 hours after meals 1 kit 0   carvedilol  (COREG ) 12.5 MG tablet TAKE ONE (1) TABLET BY MOUTH TWICE DAILY WITH MEALS *REFILL REQUEST* 60 tablet 10   glucose blood (ONETOUCH VERIO) test strip Check glucose bid 300 each 3   Lancets (ONETOUCH DELICA PLUS LANCET33G) MISC USE TO TEST BLOOD SUGAR 4 TIMES DAILY AS  DIRECTED 100 each 10   meclizine  (ANTIVERT ) 12.5 MG tablet TAKE 1 TABLET BY MOUTH THREE TIMES DAILY AS NEEDED FOR DIZZINESS 30 tablet 10   ondansetron  (ZOFRAN ) 4 MG tablet Take 1 tablet (4 mg total) by mouth every 8 (eight) hours as needed for nausea or vomiting. 20 tablet 0   pravastatin  (PRAVACHOL ) 80 MG tablet Take 1 tablet (80 mg total) by mouth every evening. 30 tablet 0   Semaglutide  (RYBELSUS ) 7 MG TABS TAKE 1 TABLET BY MOUTH ONCE DAILY 90 tablet 0   traMADol (ULTRAM) 50 MG tablet Take 50 mg by mouth 3 (three) times daily as needed.     Vitamin D , Ergocalciferol , (DRISDOL ) 1.25 MG (50000 UNIT) CAPS capsule TAKE 1 CAPSULE BY MOUTH TWICE WEEKLY 8 capsule 0   benzonatate  (TESSALON ) 100 MG capsule Take 1 capsule (100 mg total) by mouth 2 (two) times daily as needed for cough. 20 capsule 0   nitroGLYCERIN  (NITROSTAT ) 0.4 MG SL tablet Place 1 tablet (0.4 mg total) under the tongue every 5 (five) minutes as needed for chest pain. 90 tablet 3   No facility-administered medications prior to visit.    Allergies  Allergen Reactions   Sulfa Antibiotics Hives   Metformin  And Related Nausea And Vomiting    Hallucinations and bring her down.    Review of Systems  Constitutional:  Negative for chills, diaphoresis, fatigue and fever.  HENT:  Negative for congestion, ear pain and sinus pain.   Respiratory:  Negative for cough and shortness of breath.   Cardiovascular:  Negative for chest pain.  Gastrointestinal:  Negative for abdominal pain, constipation, nausea and vomiting.  Genitourinary:  Negative for dysuria.  Musculoskeletal:  Negative for arthralgias.  Skin:  Positive for rash (appears in different areas - years).  Neurological:  Negative for weakness and headaches.  Psychiatric/Behavioral:  Negative for dysphoric mood. The patient is not nervous/anxious.        Objective:        01/26/2024   10:27 AM 09/30/2023   11:03 AM 05/31/2023   11:16 AM  Vitals with BMI  Height 4\' 11"  4'  11" 4\' 11"   Weight 164 lbs 6 oz 165 lbs 164 lbs  BMI 33.19 33.31 33.11  Systolic 118 104 98  Diastolic 70 70 58  Pulse 63 56 55    No data found.   Physical Exam Constitutional:      General: She is not in acute distress.    Appearance: Normal appearance. She is not ill-appearing.  Eyes:     Conjunctiva/sclera: Conjunctivae normal.  Cardiovascular:     Rate and Rhythm: Normal rate and regular rhythm.     Heart sounds: Normal heart sounds. No murmur heard. Pulmonary:     Effort: Pulmonary effort is normal.     Breath sounds: Normal breath sounds. No wheezing.  Musculoskeletal:        General: Normal range of motion.  Skin:    Findings: Rash present.  Neurological:     Mental Status: She is alert. Mental status is at baseline.  Psychiatric:        Mood and Affect: Mood normal.        Behavior: Behavior normal.     Health Maintenance Due  Topic Date Due   OPHTHALMOLOGY EXAM  Never done   Diabetic kidney evaluation - Urine ACR  09/18/2023    There are no preventive care reminders to display for this patient.   Lab Results  Component Value Date   TSH 0.528 12/01/2023   Lab Results  Component Value Date   WBC 7.0 09/30/2023   HGB 13.1 09/30/2023   HCT 40.0 09/30/2023   MCV 90 09/30/2023   PLT 211 09/30/2023   Lab Results  Component Value Date   NA 142 09/30/2023   K 4.5 09/30/2023   CO2 22 09/30/2023   GLUCOSE 104 (H) 09/30/2023   BUN 14 09/30/2023   CREATININE 0.65 09/30/2023   BILITOT 0.4 09/30/2023   ALKPHOS 68 09/30/2023   AST 21 09/30/2023   ALT 22 09/30/2023   PROT 6.7 09/30/2023   ALBUMIN 4.1 09/30/2023   CALCIUM 9.1 09/30/2023   ANIONGAP 8 08/16/2019   EGFR 88 09/30/2023   Lab Results  Component Value Date   CHOL 115 09/30/2023   Lab Results  Component Value Date   HDL 45 09/30/2023   Lab Results  Component Value Date   LDLCALC 46 09/30/2023   Lab Results  Component Value Date   TRIG 136 09/30/2023   Lab Results  Component  Value Date   CHOLHDL 2.6 09/30/2023   Lab Results  Component Value Date   HGBA1C 5.6 09/30/2023       Assessment & Plan:  Dermatitis due to drug reaction Assessment & Plan: Suspected allergic reaction to amlodipine  with eczematous eruptions. Discontinuation considered due to well-managed hypertension with carvedilol . - Discontinue amlodipine . - Prescribe triamcinolone  topical for inflammation and itching. - Advise Benadryl for severe itching. - Follow up in one month to reassess need for amlodipine . - Consider specialist referral if symptoms persist.   Orders: -     Triamcinolone  Acetonide; Apply 1 Application topically 2 (two) times daily.  Dispense: 30 g; Refill: 0   Meds ordered this encounter  Medications   triamcinolone  cream (KENALOG ) 0.1 %    Sig: Apply 1 Application topically 2 (two) times daily.    Dispense:  30 g    Refill:  0    No orders of the defined types were placed in this encounter.    Follow-up: Return in about 4 weeks (around 02/23/2024).  An After Visit Summary was printed and given to the patient.  Delford Felling, FNP [facility] [phone]

## 2024-01-26 NOTE — Assessment & Plan Note (Signed)
 Suspected allergic reaction to amlodipine  with eczematous eruptions. Discontinuation considered due to well-managed hypertension with carvedilol . - Discontinue amlodipine . - Prescribe triamcinolone  topical for inflammation and itching. - Advise Benadryl for severe itching. - Follow up in one month to reassess need for amlodipine . - Consider specialist referral if symptoms persist.

## 2024-01-26 NOTE — Patient Instructions (Addendum)
 Discontinue Amlodipine  5 mg and follow-up in 1 month Use Triamcinolone  cream - apply TWICE A DAY as needed for itching

## 2024-01-30 ENCOUNTER — Ambulatory Visit: Payer: Medicare HMO | Admitting: Physician Assistant

## 2024-01-30 ENCOUNTER — Encounter: Payer: Self-pay | Admitting: Physician Assistant

## 2024-01-30 VITALS — BP 110/60 | HR 54 | Temp 97.6°F | Resp 18 | Ht 59.0 in | Wt 165.2 lb

## 2024-01-30 DIAGNOSIS — I4719 Other supraventricular tachycardia: Secondary | ICD-10-CM | POA: Diagnosis not present

## 2024-01-30 DIAGNOSIS — E1169 Type 2 diabetes mellitus with other specified complication: Secondary | ICD-10-CM | POA: Diagnosis not present

## 2024-01-30 DIAGNOSIS — I152 Hypertension secondary to endocrine disorders: Secondary | ICD-10-CM

## 2024-01-30 DIAGNOSIS — E785 Hyperlipidemia, unspecified: Secondary | ICD-10-CM

## 2024-01-30 DIAGNOSIS — E1159 Type 2 diabetes mellitus with other circulatory complications: Secondary | ICD-10-CM

## 2024-01-30 DIAGNOSIS — E1165 Type 2 diabetes mellitus with hyperglycemia: Secondary | ICD-10-CM | POA: Diagnosis not present

## 2024-01-30 DIAGNOSIS — Z1231 Encounter for screening mammogram for malignant neoplasm of breast: Secondary | ICD-10-CM

## 2024-01-30 DIAGNOSIS — E559 Vitamin D deficiency, unspecified: Secondary | ICD-10-CM | POA: Diagnosis not present

## 2024-01-30 DIAGNOSIS — L509 Urticaria, unspecified: Secondary | ICD-10-CM

## 2024-01-30 DIAGNOSIS — E039 Hypothyroidism, unspecified: Secondary | ICD-10-CM

## 2024-01-30 NOTE — Progress Notes (Unsigned)
 Subjective:  Patient ID: Tina Perkins, female    DOB: 02-Dec-1940  Age: 83 y.o. MRN: [id number]  Chief Complaint  Patient presents with   Medical Management of Chronic Issues    Diabetes  Hyperlipidemia  Hypertension    Pt presents for follow up of hypertension. Patient was diagnosed in 2010 The patient is tolerating the medication well without side effects. Compliance with treatment has been good; including taking medication as directed , maintains a healthy diet and  and following up as directed.currently taking norvasc  5mg  qd and coreg  12.5mg  bid Pt also with history of PAT and CAD - pt follows with cardiologist and has appt in a few months Denies chest pain, dyspnea, edema  Mixed hyperlipidemia  Pt presents with hyperlipidemia.  Compliance with treatment has been good The patient is compliant with medications, maintains a low cholesterol diet , follows up as directed ,  . The patient denies experiencing any hypercholesterolemia related symptoms. She is currently on pravachol  80mg  qd  Pt with history of vitamin D  def - currently on weekly supplement - due for labwork  Pt with history of cervical spondylosis with radiculopathy, chronic low back pain with sciatica and history of chronic knee pain with knee replacement - pt states that she has not followed up with ortho in over a year She is hesitant to schedule but says she cannot handle her pain anymore (has history of getting shots, going to pain management etc) - is currently on tramadol as needed She has not had MRI of lower back since 2019 --- she does agree however to make referral back to [facility] and that was done in August but she states she did not hear back - will make another referral  Pt with diabetes.  Pt states she is taking rybelsus  7mg  qd and tolerating med well.  Says glucose range usually around 100 fasting - voices no problems or concerns Is due for eye exam  Pt complains of sore throat, chest congestion  and productive cough for the past week Denies fever, chest pain or dyspnea  Current Outpatient Medications on File Prior to Visit  Medication Sig Dispense Refill   alendronate  (FOSAMAX ) 70 MG tablet Take 1 tablet (70 mg total) by mouth every 7 (seven) days. Take with a full glass of water on an empty stomach. 12 tablet 1   aspirin EC 81 MG tablet Take 81 mg by mouth daily. Swallow whole.     blood glucose meter kit and supplies KIT Dispense based on patient and insurance preference. Use up to four times daily as directed. 1 each 0   Blood Glucose Monitoring Suppl (ONETOUCH VERIO FLEX SYSTEM) w/Device KIT Check Glucose fasting and 2 hours after meals 1 kit 0   carvedilol  (COREG ) 12.5 MG tablet TAKE ONE (1) TABLET BY MOUTH TWICE DAILY WITH MEALS *REFILL REQUEST* 60 tablet 10   glucose blood (ONETOUCH VERIO) test strip Check glucose bid 300 each 3   Lancets (ONETOUCH DELICA PLUS LANCET33G) MISC USE TO TEST BLOOD SUGAR 4 TIMES DAILY AS DIRECTED 100 each 10   meclizine  (ANTIVERT ) 12.5 MG tablet TAKE 1 TABLET BY MOUTH THREE TIMES DAILY AS NEEDED FOR DIZZINESS 30 tablet 10   ondansetron  (ZOFRAN ) 4 MG tablet Take 1 tablet (4 mg total) by mouth every 8 (eight) hours as needed for nausea or vomiting. 20 tablet 0   pravastatin  (PRAVACHOL ) 80 MG tablet Take 1 tablet (80 mg total) by mouth every evening. 30 tablet 0   Semaglutide  (  RYBELSUS ) 7 MG TABS TAKE 1 TABLET BY MOUTH ONCE DAILY 90 tablet 0   traMADol (ULTRAM) 50 MG tablet Take 50 mg by mouth 3 (three) times daily as needed.     triamcinolone  cream (KENALOG ) 0.1 % Apply 1 Application topically 2 (two) times daily. 30 g 0   Vitamin D , Ergocalciferol , (DRISDOL ) 1.25 MG (50000 UNIT) CAPS capsule TAKE 1 CAPSULE BY MOUTH TWICE WEEKLY 8 capsule 0   nitroGLYCERIN  (NITROSTAT ) 0.4 MG SL tablet Place 1 tablet (0.4 mg total) under the tongue every 5 (five) minutes as needed for chest pain. 90 tablet 3   No current facility-administered medications on file prior to  visit.   Past Medical History:  Diagnosis Date   Acute respiratory disease due to COVID-19 virus 08/12/2019   BMI 35.0-35.9,adult 03/31/2020   Chest pain, precordial 04/10/2020   Chronic bilateral low back pain with bilateral sciatica 09/15/2021   Last Assessment & Plan:  Formatting of this note might be different from the original. 83 year old female seen today in initial consultation for her chronic back pain with bilateral lower extremity radiculopathy, left greater than right.    Will request previous lumbar spine MRI for review.  At this time, I will start patient on gabapentin 100 mg q.h.s. x2 weeks then increase her to 200 mg q.h.s.    Coronary artery disease involving native coronary artery of native heart without angina pectoris 05/10/2016   Apparently coronary intervention done in 1993 in [place]  She was fine to have 75% narrowing of the mid LAD, nitroglycerin  was given reduction to 30-40% it was also described as bridging in that area   CVA (cerebral vascular accident) (HCC)    2020   Daytime somnolence 09/15/2020   Elevated blood pressure 09/15/2021   Last Assessment & Plan:  Formatting of this note might be different from the original. Blood pressure elevated in clinic today.  Patient will continue to follow-up with his primary care provider for further management.   Essential hypertension 04/10/2020   History of hepatitis 12/14/2019   Hypovitaminosis D 10/29/2021   Mixed hyperlipidemia 12/14/2019   Neck pain 09/15/2021   Last Assessment & Plan:  Formatting of this note might be different from the original. Atraumatic, acute onset cervical spine pain and stiffness x 2-3 weeks which has been progressive without improvement.  No associated fevers or chills.  Associated headaches, left shoulder, and left upper extremity pain that radiates down into her hand and fingers. This pain is most likely radicular in nature.      Obesity (BMI 30-39.9) 04/10/2020   Pain medication agreement  09/15/2021   Last Assessment & Plan:  Formatting of this note might be different from the original. Agreement signed and placed in the patient's chart today..  UDS to be collected in compliance with clinic policies and procedures.   Patient did not display any signs of abuse or diversion and has been checked on the [place]  Controlled Substance website.   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 Added automatically from request for surgery [id number] Formatting of this note might be different  from the original. Added automatically from request for surgery [phone]   Past Surgical History:  Procedure Laterality Date   CESAREAN SECTION N/A    COLON SURGERY     TOTAL KNEE ARTHROPLASTY Bilateral    2017 and 2019    Family History  Problem Relation Age of Onset   Cancer Mother    Heart attack Mother    Heart attack Father    Cancer Father    Cancer Sister    Cancer Brother    Cancer Daughter    Social History   Socioeconomic History   Marital status: Widowed    Spouse name: Not on file   Number of children: 11   Years of education: 9   Highest education level: 9th grade  Occupational History   Occupation: Retired  Tobacco Use   Smoking status: Never    Passive exposure: Never   Smokeless tobacco: Never  Vaping Use   Vaping status: Never Used  Substance and Sexual Activity   Alcohol use: Never   Drug use: Never   Sexual activity: Not Currently  Other Topics Concern   Not on file  Social History Narrative   Not on file   Social Drivers of Health   Financial  Resource Strain: Low Risk  (05/12/2023)   Overall Financial Resource Strain (CARDIA)    Difficulty of Paying Living Expenses: Not hard at all  Food Insecurity: No Food Insecurity (08/11/2022)   Hunger Vital Sign    Worried About Running Out of Food in the Last Year: Never true    Ran Out of Food in the Last Year: Never true  Transportation Needs: No Transportation Needs (05/12/2023)   PRAPARE - Administrator, Civil Service (Medical): No    Lack of Transportation (Non-Medical): No  Physical Activity: Inactive (05/12/2023)   Exercise Vital Sign    Days of Exercise per Week: 0 days    Minutes of Exercise per Session: 0 min  Stress: No Stress Concern Present (05/12/2023)   [employer] of Occupational Health - Occupational Stress Questionnaire    Feeling of Stress : Not at all  Social Connections: Socially Isolated (05/12/2023)   Social Connection and Isolation Panel [NHANES]    Frequency of Communication with Friends and Family: More than three times a week    Frequency of Social Gatherings with Friends and Family: More than three times a week    Attends Religious Services: Never    Database administrator or Organizations: No    Attends Banker Meetings: Never    Marital Status: Widowed    CONSTITUTIONAL: see HPI E/N/T: see HPI CARDIOVASCULAR: Negative for chest pain, dizziness, palpitations and pedal edema.  RESPIRATORY: see HPI GASTROINTESTINAL: Negative for abdominal pain, acid reflux symptoms, constipation, diarrhea, nausea and vomiting.   INTEGUMENTARY: Negative for rash.   PSYCHIATRIC: Negative for sleep disturbance and to question depression screen.  Negative for depression, negative for anhedonia.       Objective:  PHYSICAL EXAM:   VS: BP 110/60   Pulse (!) 54   Temp 97.6 F (36.4 C) (Temporal)   Resp 18   Ht 4\' 11"  (1.499 m)   Wt 165 lb 3.2 oz (74.9 kg)   SpO2 97%   BMI 33.37 kg/m   GEN: Well nourished, well developed, in no acute  distress  HEENT: normal external ears and nose - normal external auditory canals and TMS  - Lips, Teeth and Gums - normal  Oropharynx -  erythema/pnd Cardiac: RRR; no murmurs, Respiratory:  faint rhonchi/clears with cough  Psych: euthymic mood, appropriate affect and demeanor  Diabetic Foot Exam - Simple   No data filed     Lab Results  Component Value Date   WBC 7.0 09/30/2023   HGB 13.1 09/30/2023   HCT 40.0 09/30/2023   PLT 211 09/30/2023   GLUCOSE 104 (H) 09/30/2023   CHOL 115 09/30/2023   TRIG 136 09/30/2023   HDL 45 09/30/2023   LDLCALC 46 09/30/2023   ALT 22 09/30/2023   AST 21 09/30/2023   NA 142 09/30/2023   K 4.5 09/30/2023   CL 106 09/30/2023   CREATININE 0.65 09/30/2023   BUN 14 09/30/2023   CO2 22 09/30/2023   TSH 0.528 12/01/2023   HGBA1C 5.6 09/30/2023      Assessment & Plan:   Problem List Items Addressed This Visit       Cardiovascular and Mediastinum   Coronary artery disease involving native coronary artery of native heart without angina pectoris   Relevant Orders   Follow up with cardiologist as directed Continue meds   Hypertension associated with diabetes (HCC)   Relevant Orders   CBC with Differential/Platelet   Comprehensive metabolic panel   TSH Continue meds           Chronic low back pain with sciatica Referral to ortho              Endocrine   Type 2 diabetes mellitus with hyperglycemia, without long-term current use of insulin  - Primary   Relevant Orders   Hemoglobin A1c Continue current meds              Other   Hyperlipidemia associated with diabetes (HCC)   Relevant Orders   Lipid panel Continue med   Hypovitaminosis D   Relevant Orders   VITAMIN D  25 Hydroxy (Vit-D Deficiency, Fractures)      URI Rx for amoxil  875mg  bid Rx for tessalon  perles                                         .  No orders of the defined types were placed in this encounter.   No orders of the defined types were  placed in this encounter.    Follow-up: Return in about 4 months (around 05/31/2024) for chronic fasting follow-up.  An After Visit Summary was printed and given to the patient.  Anthonette Bastos [facility] [phone]

## 2024-02-01 LAB — COMPREHENSIVE METABOLIC PANEL WITH GFR
ALT: 26 IU/L (ref 0–32)
AST: 30 IU/L (ref 0–40)
Albumin: 4.6 g/dL (ref 3.7–4.7)
Alkaline Phosphatase: 52 IU/L (ref 44–121)
BUN/Creatinine Ratio: 16 (ref 12–28)
BUN: 12 mg/dL (ref 8–27)
Bilirubin Total: 0.4 mg/dL (ref 0.0–1.2)
CO2: 17 mmol/L — ABNORMAL LOW (ref 20–29)
Calcium: 9.5 mg/dL (ref 8.7–10.3)
Chloride: 107 mmol/L — ABNORMAL HIGH (ref 96–106)
Creatinine, Ser: 0.76 mg/dL (ref 0.57–1.00)
Globulin, Total: 2.2 g/dL (ref 1.5–4.5)
Glucose: 96 mg/dL (ref 70–99)
Potassium: 4.7 mmol/L (ref 3.5–5.2)
Sodium: 141 mmol/L (ref 134–144)
Total Protein: 6.8 g/dL (ref 6.0–8.5)
eGFR: 78 mL/min/{1.73_m2} (ref >59)

## 2024-02-01 LAB — CBC WITH DIFFERENTIAL/PLATELET
Basophils Absolute: 0.1 10*3/uL (ref 0.0–0.2)
Basos: 1 %
EOS (ABSOLUTE): 0.3 10*3/uL (ref 0.0–0.4)
Eos: 5 %
Hematocrit: 42.1 % (ref 34.0–46.6)
Hemoglobin: 14.1 g/dL (ref 11.1–15.9)
Immature Grans (Abs): 0 10*3/uL (ref 0.0–0.1)
Immature Granulocytes: 0 %
Lymphocytes Absolute: 1.7 10*3/uL (ref 0.7–3.1)
Lymphs: 28 %
MCH: 29.6 pg (ref 26.6–33.0)
MCHC: 33.5 g/dL (ref 31.5–35.7)
MCV: 88 fL (ref 79–97)
Monocytes Absolute: 0.5 10*3/uL (ref 0.1–0.9)
Monocytes: 8 %
Neutrophils Absolute: 3.5 10*3/uL (ref 1.4–7.0)
Neutrophils: 58 %
Platelets: 250 10*3/uL (ref 150–450)
RBC: 4.77 x10E6/uL (ref 3.77–5.28)
RDW: 11.8 % (ref 11.7–15.4)
WBC: 6 10*3/uL (ref 3.4–10.8)

## 2024-02-01 LAB — LIPID PANEL
Chol/HDL Ratio: 2.7 ratio (ref 0.0–4.4)
Cholesterol, Total: 116 mg/dL (ref 100–199)
HDL: 43 mg/dL (ref >39)
LDL Chol Calc (NIH): 50 mg/dL (ref 0–99)
Triglycerides: 133 mg/dL (ref 0–149)
VLDL Cholesterol Cal: 23 mg/dL (ref 5–40)

## 2024-02-01 LAB — HEMOGLOBIN A1C
Est. average glucose Bld gHb Est-mCnc: 114 mg/dL
Hgb A1c MFr Bld: 5.6 % (ref 4.8–5.6)

## 2024-02-01 LAB — VITAMIN D 25 HYDROXY (VIT D DEFICIENCY, FRACTURES): Vit D, 25-Hydroxy: 72.8 ng/mL (ref 30.0–100.0)

## 2024-02-16 ENCOUNTER — Other Ambulatory Visit: Payer: Self-pay | Admitting: Cardiology

## 2024-02-16 DIAGNOSIS — E782 Mixed hyperlipidemia: Secondary | ICD-10-CM

## 2024-02-21 ENCOUNTER — Other Ambulatory Visit: Payer: Self-pay | Admitting: Cardiology

## 2024-02-21 DIAGNOSIS — E782 Mixed hyperlipidemia: Secondary | ICD-10-CM

## 2024-02-23 ENCOUNTER — Ambulatory Visit: Admitting: Family Medicine

## 2024-02-24 ENCOUNTER — Other Ambulatory Visit: Payer: Self-pay | Admitting: Family Medicine

## 2024-03-16 ENCOUNTER — Encounter: Payer: Self-pay | Admitting: Internal Medicine

## 2024-03-16 ENCOUNTER — Ambulatory Visit: Admitting: Internal Medicine

## 2024-03-16 VITALS — BP 112/70 | HR 68 | Resp 18 | Ht 59.0 in | Wt 165.2 lb

## 2024-03-16 DIAGNOSIS — I10 Essential (primary) hypertension: Secondary | ICD-10-CM | POA: Diagnosis not present

## 2024-03-16 DIAGNOSIS — L501 Idiopathic urticaria: Secondary | ICD-10-CM

## 2024-03-16 MED ORDER — FEXOFENADINE HCL 180 MG PO TABS: 180.0000 mg | ORAL_TABLET | Freq: Every day | ORAL | 5 refills | Status: AC

## 2024-03-16 NOTE — Patient Instructions (Addendum)
 Chronic Idiopathic Urticaria: - this is defined as hives lasting more than 6 weeks without an identifiable trigger - hives can be from a number of different sources including infections, allergies, vibration, temperature, pressure among many others other possible causes - often an identifiable cause is not determined - some potential triggers include: stress, illness, NSAIDs, aspirin, hormonal changes - you do not have any red flag symptoms to make us  concerned about secondary causes of hives, but we will screen for these for reassurance with: CBC w diff, tryptase, TSH, hive panel, alpha-gal panel, inflammatory markers, IgE - approximately 50% of patients with chronic hives can have some associated swelling of the face/lips/eyelids (this is not a cause for alarm and does not typically progress onto systemic allergic reactions)  Therapy Plan:  - start allegra (fexofenadine) 180mg  daily  - if hives are uncontrolled, increase fexofenadine twice daily - if hives are still uncontrolled with the above regimen, please arrange an appointment for discussion of Xolair (omalizumab)- an injectable medication for hives   Can purchase fexofenadine cheaply from [employer], [employer], [employer] or [employer].  Generic is fine.   Recommend avoiding any sedating antihistamines as this can be associated with dementia

## 2024-03-16 NOTE — Progress Notes (Unsigned)
 NEW PATIENT Date of Service/Encounter:  03/16/24 Referring provider: Cyndi Drain, PA-C Primary care provider: Cyndi Drain, PA-C  Subjective:  Tina Perkins is a 83 y.o. female  presenting today for evaluation of hives and swelling  History obtained from: chart review and patient.   Discussed the use of AI scribe software for clinical note transcription with the patient, who gave verbal consent to proceed.  Tina Perkins is an 83 year old female with chronic spontaneous urticaria and angioedema who presents with persistent itching and hives.  She has been experiencing intermittent hives and itching for the past two years. The hives appear randomly, lasting from two to three days up to a week, and then resolve before reappearing after a few days to a week. The itching often starts in her fingers and spreads, becoming red and sometimes forming welts, particularly on her arms. Scratching exacerbates the redness and welts.  She has been using Benadryl and a lotion for relief, but finds that Benadryl only provides temporary relief for a few minutes. She has tried ice, heat, and various lotions without significant improvement. Hot showers can worsen the irritation if her skin is already red and itchy.  She recalls having COVID-19 in 2022, which was around the time her hives began. She denies any specific triggers for the hives, stating they appear randomly. She does not consume much red meat and has not identified any foods that worsen her symptoms.  Her current medications include daily aspirin and blood pressure medications, although her blood pressure is typically low and one medication was discontinued. She had a heart attack in 1993 and a stroke in 2015 or 2016.  During the review of symptoms, she confirms that scratching leads to redness and welts, and hot water can irritate her skin further. She does not take any pain medications regularly and seldom experiences headaches.   Other allergy  screening: Asthma: no Rhino conjunctivitis: no Food allergy: no Medication allergy: no Hymenoptera allergy: no Urticaria: yes Eczema:no History of recurrent infections suggestive of immunodeficency: no Vaccinations are up to date.   Past Medical History: Past Medical History:  Diagnosis Date   Acute respiratory disease due to COVID-19 virus 08/12/2019   BMI 35.0-35.9,adult 03/31/2020   Chest pain, precordial 04/10/2020   Chronic bilateral low back pain with bilateral sciatica 09/15/2021   Last Assessment & Plan:  Formatting of this note might be different from the original. 83 year old female seen today in initial consultation for her chronic back pain with bilateral lower extremity radiculopathy, left greater than right.    Will request previous lumbar spine MRI for review.  At this time, I will start patient on gabapentin 100 mg q.h.s. x2 weeks then increase her to 200 mg q.h.s.    Coronary artery disease involving native coronary artery of native heart without angina pectoris 05/10/2016   Apparently coronary intervention done in 1993 in [place]  She was fine to have 75% narrowing of the mid LAD, nitroglycerin  was given reduction to 30-40% it was also described as bridging in that area   CVA (cerebral vascular accident) (HCC)    2020   Daytime somnolence 09/15/2020   Elevated blood pressure 09/15/2021   Last Assessment & Plan:  Formatting of this note might be different from the original. Blood pressure elevated in clinic today.  Patient will continue to follow-up with his primary care provider for further management.   Essential hypertension 04/10/2020   History of hepatitis 12/14/2019   Hypovitaminosis D 10/29/2021   Mixed  hyperlipidemia 12/14/2019   Neck pain 09/15/2021   Last Assessment & Plan:  Formatting of this note might be different from the original. Atraumatic, acute onset cervical spine pain and stiffness x 2-3 weeks which has been progressive without improvement.  No  associated fevers or chills.  Associated headaches, left shoulder, and left upper extremity pain that radiates down into her hand and fingers. This pain is most likely radicular in nature.      Obesity (BMI 30-39.9) 04/10/2020   Pain medication agreement 09/15/2021   Last Assessment & Plan:  Formatting of this note might be different from the original. Agreement signed and placed in the patient's chart today..  UDS to be collected in compliance with clinic policies and procedures.   Patient did not display any signs of abuse or diversion and has been checked on the   Controlled Substance website.   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 Added automatically from request for surgery [id number] Formatting of this note might be different from the original. Added automatically from request for surgery [id number]   Medication List:  Current Outpatient Medications  Medication Sig Dispense Refill   alendronate  (FOSAMAX ) 70 MG tablet TAKE 1 TABLET BY MOUTH EVERY 7 DAYS. TAKE WITH A FULL GLASS OF WATER ON AN EMPTY STOMACH. 12 tablet 3   aspirin EC 81 MG tablet Take 81 mg by mouth daily. Swallow whole.     blood glucose meter kit and supplies KIT Dispense based on patient and insurance preference. Use up to four times daily as directed. 1 each 0   Blood Glucose Monitoring Suppl (ONETOUCH VERIO FLEX SYSTEM) w/Device KIT Check Glucose fasting and 2 hours after meals 1 kit 0   carvedilol  (COREG ) 12.5 MG tablet TAKE ONE (1) TABLET BY MOUTH TWICE DAILY WITH MEALS *REFILL REQUEST* 60 tablet 10    glucose blood (ONETOUCH VERIO) test strip Check glucose bid 300 each 3   Lancets (ONETOUCH DELICA PLUS LANCET33G) MISC USE TO TEST BLOOD SUGAR 4 TIMES DAILY AS DIRECTED 100 each 10   meclizine  (ANTIVERT ) 12.5 MG tablet TAKE 1 TABLET BY MOUTH THREE TIMES DAILY AS NEEDED FOR DIZZINESS 30 tablet 10   nitroGLYCERIN  (NITROSTAT ) 0.4 MG SL tablet Place 1 tablet (0.4 mg total) under the tongue every 5 (five) minutes as needed for chest pain. 90 tablet 3   ondansetron  (ZOFRAN ) 4 MG tablet Take 1 tablet (4 mg total) by mouth every 8 (eight) hours as needed for nausea or vomiting. 20 tablet 0   pravastatin  (PRAVACHOL ) 80 MG tablet Take 0.5 tablets (40 mg total) by mouth daily. Patient needs an appointment for further refills. 2 nd attempt 15 tablet 0   Semaglutide  (RYBELSUS ) 7 MG TABS TAKE 1 TABLET BY MOUTH ONCE DAILY 90 tablet 0   traMADol (ULTRAM) 50 MG tablet Take 50 mg by mouth 3 (three) times daily as needed.     triamcinolone  cream (KENALOG ) 0.1 % Apply 1 Application topically 2 (two) times daily. 30 g 0   Vitamin D , Ergocalciferol , (DRISDOL ) 1.25 MG (50000 UNIT) CAPS capsule TAKE 1 CAPSULE BY MOUTH  TWICE WEEKLY 8 capsule 0   No current facility-administered medications for this visit.   Known Allergies:  Allergies  Allergen Reactions   Sulfa Antibiotics Hives   Metformin  And Related Nausea And Vomiting    Hallucinations and bring her down.   Past Surgical History: Past Surgical History:  Procedure Laterality Date   CESAREAN SECTION N/A    COLON SURGERY     TOTAL KNEE ARTHROPLASTY Bilateral    2017 and 2019   Family History: Family History  Problem Relation Age of Onset   Cancer Mother    Heart attack Mother    Heart attack Father    Cancer Father    Cancer Sister    Cancer Brother    Cancer Daughter    Social History: [place] lives ***.   ROS:  All other systems negative except as noted per HPI.  Objective:  There were no vitals taken for this visit. There is no height or  weight on file to calculate BMI. Physical Exam:  General Appearance:  Alert, cooperative, no distress, appears stated age  Head:  Normocephalic, without obvious abnormality, atraumatic  Eyes:  Conjunctiva clear, EOM's intact  Ears {Blank multiple:19196:a:***,EACs normal bilaterally,normal TMs bilaterally,ear tubes present bilaterally without exudate}  Nose: Nares normal, {Blank multiple:19196:a:***,hypertrophic turbinates,normal mucosa,no visible anterior polyps,septum midline}  Throat: Lips, tongue normal; teeth and gums normal, {Blank multiple:19196:a:***,normal posterior oropharynx,tonsils 2+,tonsils 3+,no tonsillar exudate,+ cobblestoning,surgically absent tonsils,mildly erythematous posterior oropharynx}  Neck: Supple, symmetrical  Lungs:   {Blank multiple:19196:a:***,clear to auscultation bilaterally,end-expiratory wheezing,wheezing throughout}, Respirations unlabored, {Blank multiple:19196:a:***,no coughing,intermittent dry coughing,intermittent productive-sounding cough}  Heart:  {Blank multiple:19196:a:***,regular rate and rhythm,no murmur}, Appears well perfused  Extremities: No edema  Skin: {Blank multiple:19196:a:***,erythematous, dry patches scattered on ***,lichenification on ***,Skin color, texture, turgor normal,no rashes or lesions on visualized portions of skin}  Neurologic: No gross deficits   Diagnostics: None done   Labs:  Lab Orders  No laboratory test(s) ordered today     Assessment and Plan  Assessment and Plan Assessment & Plan  Chronic Idiopathic Urticaria: - this is defined as hives lasting more than 6 weeks without an identifiable trigger - hives can be from a number of different sources including infections, allergies, vibration, temperature, pressure among many others other possible causes - often an identifiable cause is not determined - some potential triggers include: stress, illness,  NSAIDs, aspirin, hormonal changes - you do not have any red flag symptoms to make us  concerned about secondary causes of hives, but we will screen for these for reassurance with: CBC w diff, tryptase, TSH, hive panel, alpha-gal panel, inflammatory markers, IgE - approximately 50% of patients with chronic hives can have some associated swelling of the face/lips/eyelids (this is not a cause for alarm and does not typically progress onto systemic allergic reactions)  Therapy Plan:  - start allegra (fexofenadine) 180mg  daily  - if hives are uncontrolled, increase fexofenadine twice daily - if hives are still uncontrolled with the above regimen, please arrange an appointment for discussion of Xolair (omalizumab)- an injectable medication for hives   Can purchase fexofenadine cheaply from [employer], [employer], [employer] or [employer].  Generic is fine.   Recommend avoiding any sedating antihistamines as this can be associated with dementia        This note in its entirety was forwarded to the Provider who requested this consultation.  Other:    Thank you for your kind referral. I appreciate the opportunity to take part in Jaala's care. Please do not hesitate  to contact me with questions.  Sincerely,  Thank you so much for letting me partake in your care today.  Don't hesitate to reach out if you have any additional concerns!  Orelia Binet, MD  [facility]- [place], [place]

## 2024-03-25 LAB — CHRONIC URTICARIA (CU) EVAL
ALT: 25 IU/L (ref 0–32)
Basophils Absolute: 0 10*3/uL (ref 0.0–0.2)
Basos: 1 %
CRP: 1 mg/L (ref 0–10)
EOS (ABSOLUTE): 0.2 10*3/uL (ref 0.0–0.4)
Eos: 4 %
Hematocrit: 44 % (ref 34.0–46.6)
Hemoglobin: 14.5 g/dL (ref 11.1–15.9)
Immature Grans (Abs): 0 10*3/uL (ref 0.0–0.1)
Immature Granulocytes: 0 %
Lymphocytes Absolute: 1.6 10*3/uL (ref 0.7–3.1)
Lymphs: 32 %
MCH: 30.1 pg (ref 26.6–33.0)
MCHC: 33 g/dL (ref 31.5–35.7)
MCV: 92 fL (ref 79–97)
Monocytes Absolute: 0.3 10*3/uL (ref 0.1–0.9)
Monocytes: 6 %
Neutrophils Absolute: 2.8 10*3/uL (ref 1.4–7.0)
Neutrophils: 57 %
Platelets: 235 10*3/uL (ref 150–450)
Pooled Donor- BAT CU: 2.7 % (ref 0.00–10.60)
RBC: 4.81 x10E6/uL (ref 3.77–5.28)
RDW: 12.1 % (ref 11.7–15.4)
Sed Rate: 8 mm/h (ref 0–40)
TSH: 0.302 u[IU]/mL — ABNORMAL LOW (ref 0.450–4.500)
Thyroperoxidase Ab SerPl-aCnc: 17 [IU]/mL (ref 0–34)
WBC: 4.9 10*3/uL (ref 3.4–10.8)

## 2024-03-25 LAB — ALPHA-GAL PANEL
Allergen Lamb IgE: <0.1 kU/L
Beef IgE: <0.1 kU/L
IgE (Immunoglobulin E), Serum: 11 [IU]/mL (ref 6–495)
O215-IgE Alpha-Gal: <0.1 kU/L
Pork IgE: <0.1 kU/L

## 2024-03-25 LAB — TRYPTASE: Tryptase: 19.8 ug/L — ABNORMAL HIGH (ref 2.2–13.2)

## 2024-03-28 ENCOUNTER — Ambulatory Visit: Payer: Self-pay | Admitting: Internal Medicine

## 2024-03-28 NOTE — Progress Notes (Signed)
 I reviewed the bloodwork. Blood count, kidney function, liver function, electrolytes, autoimmune screener, inflammation markers, chronic urticaria index (checks for autoantibodies that trigger mast cells),  were all normal which is great.   Thyroid  level is low, this needs to rechecked by PCP.    tryptase (checks for mast cell issues)  was elevated as well. We need to repeat this.  You can walk in to our [place] or [place] office for lab draw.

## 2024-03-29 ENCOUNTER — Other Ambulatory Visit: Payer: Self-pay | Admitting: *Deleted

## 2024-03-29 ENCOUNTER — Telehealth: Payer: Self-pay | Admitting: Internal Medicine

## 2024-03-29 DIAGNOSIS — L501 Idiopathic urticaria: Secondary | ICD-10-CM

## 2024-03-29 DIAGNOSIS — R748 Abnormal levels of other serum enzymes: Secondary | ICD-10-CM

## 2024-03-29 NOTE — Telephone Encounter (Signed)
 Call was regarding lab results. Spoke with patient.

## 2024-03-29 NOTE — Telephone Encounter (Signed)
 Marcha called and stated she was returning a phone call, and would like a call back. Best contact is [phone].

## 2024-04-02 DIAGNOSIS — R748 Abnormal levels of other serum enzymes: Secondary | ICD-10-CM | POA: Diagnosis not present

## 2024-04-02 DIAGNOSIS — L501 Idiopathic urticaria: Secondary | ICD-10-CM | POA: Diagnosis not present

## 2024-04-05 LAB — TRYPTASE: Tryptase: 20.8 ug/L — ABNORMAL HIGH (ref 2.2–13.2)

## 2024-04-18 ENCOUNTER — Other Ambulatory Visit: Payer: Self-pay | Admitting: Physician Assistant

## 2024-04-18 DIAGNOSIS — R42 Dizziness and giddiness: Secondary | ICD-10-CM

## 2024-05-09 NOTE — Progress Notes (Signed)
   05/09/2024  Patient ID: Tina Perkins, female   DOB: May 03, 1941, 83 y.o.   MRN: [id number]  Pharmacy Quality Measure Review  This patient is appearing on a report for being at risk of failing the adherence measure for diabetes medications this calendar year.   Medication: rybelsus  04/18/2024 30 DS, [facility] at [facility]   Insurance report was not up to date. No action needed at this time.   Lang Sieve, PharmD, BCGP Clinical Pharmacist  [phone]

## 2024-05-11 ENCOUNTER — Ambulatory Visit: Payer: Self-pay

## 2024-05-11 ENCOUNTER — Encounter (HOSPITAL_BASED_OUTPATIENT_CLINIC_OR_DEPARTMENT_OTHER): Payer: Self-pay

## 2024-05-11 ENCOUNTER — Ambulatory Visit (HOSPITAL_BASED_OUTPATIENT_CLINIC_OR_DEPARTMENT_OTHER)
Admission: RE | Admit: 2024-05-11 | Discharge: 2024-05-11 | Disposition: A | Discharge status: Home / Self Care | Source: Ambulatory Visit | Attending: Physician Assistant | Admitting: Physician Assistant

## 2024-05-11 VITALS — BP 123/77 | HR 51 | Temp 97.9°F | Resp 18

## 2024-05-11 DIAGNOSIS — M79652 Pain in left thigh: Secondary | ICD-10-CM | POA: Diagnosis not present

## 2024-05-11 DIAGNOSIS — L539 Erythematous condition, unspecified: Secondary | ICD-10-CM | POA: Diagnosis not present

## 2024-05-11 DIAGNOSIS — M25562 Pain in left knee: Secondary | ICD-10-CM | POA: Diagnosis not present

## 2024-05-11 DIAGNOSIS — M79662 Pain in left lower leg: Secondary | ICD-10-CM | POA: Diagnosis not present

## 2024-05-11 DIAGNOSIS — Z96652 Presence of left artificial knee joint: Secondary | ICD-10-CM | POA: Diagnosis not present

## 2024-05-11 DIAGNOSIS — M7989 Other specified soft tissue disorders: Secondary | ICD-10-CM | POA: Diagnosis not present

## 2024-05-11 DIAGNOSIS — L03116 Cellulitis of left lower limb: Secondary | ICD-10-CM | POA: Diagnosis not present

## 2024-05-11 NOTE — ED Provider Notes (Signed)
 [facility]    CSN: [id number] Arrival date & time: 05/11/24  1356      History   Chief Complaint Chief Complaint  Patient presents with   Leg Pain    HPI Tina Perkins is a 83 y.o. female.   83 year old female who has had left leg pain and swelling that started on approximately 04/17/2024.  States has some itching and tingling.  She has a definite area of erythema and its circumferential below the knee and extends above the knee to the mid thigh.  This area is warm to touch and almost twice the size of the right leg.   Leg Pain Associated symptoms: no back pain and no fever     Past Medical History:  Diagnosis Date   Acute respiratory disease due to COVID-19 virus 08/12/2019   BMI 35.0-35.9,adult 03/31/2020   Chest pain, precordial 04/10/2020   Chronic bilateral low back pain with bilateral sciatica 09/15/2021   Last Assessment & Plan:  Formatting of this note might be different from the original. 83 year old female seen today in initial consultation for her chronic back pain with bilateral lower extremity radiculopathy, left greater than right.    Will request previous lumbar spine MRI for review.  At this time, I will start patient on gabapentin 100 mg q.h.s. x2 weeks then increase her to 200 mg q.h.s.    Coronary artery disease involving native coronary artery of native heart without angina pectoris 05/10/2016   Apparently coronary intervention done in 1993 in [place]  She was fine to have 75% narrowing of the mid LAD, nitroglycerin  was given reduction to 30-40% it was also described as bridging in that area   CVA (cerebral vascular accident) (HCC)    2020   Daytime somnolence 09/15/2020   Elevated blood pressure 09/15/2021   Last Assessment & Plan:  Formatting of this note might be different from the original. Blood pressure elevated in clinic today.  Patient will continue to follow-up with his primary care provider for further management.   Essential hypertension  04/10/2020   History of hepatitis 12/14/2019   Hypovitaminosis D 10/29/2021   Mixed hyperlipidemia 12/14/2019   Neck pain 09/15/2021   Last Assessment & Plan:  Formatting of this note might be different from the original. Atraumatic, acute onset cervical spine pain and stiffness x 2-3 weeks which has been progressive without improvement.  No associated fevers or chills.  Associated headaches, left shoulder, and left upper extremity pain that radiates down into her hand and fingers. This pain is most likely radicular in nature.      Obesity (BMI 30-39.9) 04/10/2020   Pain medication agreement 09/15/2021   Last Assessment & Plan:  Formatting of this note might be different from the original. Agreement signed and placed in the patient's chart today..  UDS to be collected in compliance with clinic policies and procedures.   Patient did not display any signs of abuse or diversion and has been checked on the [place]  Controlled Substance website.   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 Added automatically from request for surgery  [id number] Formatting of this note might be different from the original. Added automatically from request for surgery [id number]    Patient Active Problem List   Diagnosis Date Noted   Dermatitis due to drug reaction 01/26/2024   Acquired hypothyroidism 09/30/2023   Nausea and vomiting 01/17/2023   Diarrhea 01/17/2023   Weight loss 01/17/2023   Atypical chest pain 01/17/2023   Lower abdominal pain 01/17/2023   Multiple thyroid  nodules 01/17/2023   Type 2  diabetes mellitus with hyperglycemia, without long-term current use of insulin  (HCC) 09/17/2022   Abnormal laboratory test 09/17/2022   Right hip pain 06/14/2022   Acute pain of left shoulder 06/14/2022   Peripheral neuropathy 03/10/2022   BMI 36.0-36.9,adult 03/10/2022   Class 2 obesity due to excess calories with body mass index (BMI) of 36.0 to 36.9 in adult 12/15/2021   Cervical spondylosis with radiculopathy 12/15/2021   DDD (degenerative disc disease), cervical 12/15/2021   Lumbar spondylolysis 12/15/2021   Hypovitaminosis D 10/29/2021   Chronic bilateral low back pain with bilateral sciatica 09/15/2021   Neck pain 09/15/2021   Pain medication agreement 09/15/2021   PAT (paroxysmal atrial tachycardia) (HCC) 09/15/2020   Second degree AV block, Mobitz type I 09/15/2020   Essential hypertension 04/10/2020   Prediabetes 12/28/2019   Mixed hyperlipidemia 12/14/2019   Patent foramen ovale 12/14/2019   Sequelae of cerebral infarction 12/14/2019   History of hepatitis 12/14/2019   Spinal stenosis, lumbar region with neurogenic claudication 02/28/2019   Preop cardiovascular exam 06/14/2018   Coronary artery disease involving native coronary artery of native heart without angina pectoris 05/10/2016    Past Surgical History:  Procedure Laterality Date   CESAREAN SECTION N/A    COLON SURGERY     TOTAL KNEE ARTHROPLASTY Bilateral    2017 and 2019    OB History   No obstetric history on file.      Home Medications    Prior to Admission medications   Medication Sig Start Date End Date Taking? Authorizing Provider  alendronate  (FOSAMAX ) 70 MG tablet TAKE 1 TABLET BY MOUTH EVERY 7 DAYS. TAKE WITH A FULL GLASS OF WATER ON AN EMPTY STOMACH. 02/27/24  Yes Cox, Kirsten, MD  aspirin EC 81 MG tablet Take 81 mg by mouth daily. Swallow whole.   Yes [provider]  carvedilol  (COREG ) 12.5 MG tablet TAKE ONE (1) TABLET BY MOUTH TWICE DAILY WITH MEALS *REFILL REQUEST* 06/01/23  Yes  Nicholaus Credit, PA-C  pravastatin  (PRAVACHOL ) 80 MG tablet Take 0.5 tablets (40 mg total) by mouth daily. Patient needs an appointment for further refills. 2 nd attempt 02/17/24  Yes Monetta Redell PARAS, MD  Semaglutide  (RYBELSUS ) 7 MG TABS TAKE 1 TABLET BY MOUTH ONCE DAILY 01/19/24  Yes Nicholaus Credit, PA-C  Vitamin D , Ergocalciferol , (DRISDOL ) 1.25 MG (50000 UNIT) CAPS capsule TAKE 1 CAPSULE BY MOUTH TWICE WEEKLY 01/19/24  Yes Nicholaus Credit, PA-C  blood glucose meter kit and supplies KIT Dispense based on patient and insurance preference. Use up to four times daily as directed. 10/11/22   Nicholaus Credit, PA-C  Blood Glucose Monitoring Suppl (ONETOUCH VERIO FLEX SYSTEM) w/Device KIT Check Glucose fasting and 2 hours after meals 10/15/22   Nicholaus Credit, PA-C  fexofenadine  (ALLEGRA ) 180 MG tablet Take 1 tablet (180 mg total) by mouth daily. 03/16/24   Lorin Norris, MD  glucose blood [facility] VERIO) test strip Check glucose bid 06/10/23   Nicholaus Credit, PA-C  Lancets [facility] DELICA PLUS LANCET33G) MISC USE TO TEST BLOOD SUGAR 4  TIMES DAILY AS DIRECTED 11/15/23   Nicholaus Credit, PA-C  meclizine  (ANTIVERT ) 12.5 MG tablet TAKE 1 TABLET BY MOUTH THREE TIMES DAILY AS NEEDED FOR DIZZINESS 04/19/24   Nicholaus Credit, PA-C  nitroGLYCERIN  (NITROSTAT ) 0.4 MG SL tablet Place 1 tablet (0.4 mg total) under the tongue every 5 (five) minutes as needed for chest pain. 04/10/20 03/16/24  Tobb, Kardie, DO  ondansetron  (ZOFRAN ) 4 MG tablet Take 1 tablet (4 mg total) by mouth every 8 (eight) hours as needed for nausea or vomiting. 02/11/23   Nicholaus Credit, PA-C  traMADol (ULTRAM) 50 MG tablet Take 50 mg by mouth 3 (three) times daily as needed. 08/24/22   [provider]  triamcinolone  cream (KENALOG ) 0.1 % Apply 1 Application topically 2 (two) times daily. 01/26/24   Teressa Harrie HERO, FNP    Family History Family History  Problem Relation Age of Onset   Cancer Mother    Heart attack Mother    Heart attack Father    Cancer Father    Cancer  Sister    Cancer Brother    Cancer Daughter     Social History Social History   Tobacco Use   Smoking status: Never    Passive exposure: Never   Smokeless tobacco: Never  Vaping Use   Vaping status: Never Used  Substance Use Topics   Alcohol use: Never   Drug use: Never     Allergies   Sulfa antibiotics and Metformin  and related   Review of Systems Review of Systems  Constitutional:  Negative for fever.  Respiratory:  Negative for cough.   Cardiovascular:  Positive for leg swelling. Negative for chest pain.  Gastrointestinal:  Negative for abdominal pain, constipation, diarrhea, nausea and vomiting.  Musculoskeletal:  Positive for arthralgias (Left leg pain and swelling with erythema and warmth). Negative for back pain.  Skin:  Negative for color change and rash.  Neurological:  Negative for syncope.  All other systems reviewed and are negative.    Physical Exam Triage Vital Signs ED Triage Vitals  Encounter Vitals Group     BP 05/11/24 1433 123/77     Girls Systolic BP Percentile --      Girls Diastolic BP Percentile --      Boys Systolic BP Percentile --      Boys Diastolic BP Percentile --      Pulse Rate 05/11/24 1433 (!) 51     Resp 05/11/24 1433 18     Temp 05/11/24 1433 97.9 F (36.6 C)     Temp Source 05/11/24 1433 Oral     SpO2 05/11/24 1433 94 %     Weight --      Height --      Head Circumference --      Peak Flow --      Pain Score 05/11/24 1431 10     Pain Loc --      Pain Education --      Exclude from Growth Chart --    No data found.  Updated Vital Signs BP 123/77 (BP Location: Right Arm)   Pulse (!) 51   Temp 97.9 F (36.6 C) (Oral)   Resp 18   SpO2 94%   Visual Acuity Right Eye Distance:   Left Eye Distance:   Bilateral Distance:    Right Eye Near:   Left Eye Near:    Bilateral Near:     Physical Exam Vitals and nursing note reviewed.  Constitutional:  General: She is not in acute distress.    Appearance: She is  well-developed. She is not ill-appearing or toxic-appearing.  HENT:     Head: Normocephalic and atraumatic.     Right Ear: Hearing, tympanic membrane, ear canal and external ear normal.     Left Ear: Hearing, tympanic membrane, ear canal and external ear normal.     Nose: No congestion or rhinorrhea.     Right Sinus: No maxillary sinus tenderness or frontal sinus tenderness.     Left Sinus: No maxillary sinus tenderness or frontal sinus tenderness.     Mouth/Throat:     Lips: Pink.     Mouth: Mucous membranes are moist.     Pharynx: Uvula midline. No oropharyngeal exudate or posterior oropharyngeal erythema.     Tonsils: No tonsillar exudate.  Eyes:     Conjunctiva/sclera: Conjunctivae normal.     Pupils: Pupils are equal, round, and reactive to light.  Cardiovascular:     Rate and Rhythm: Normal rate and regular rhythm.     Heart sounds: S1 normal and S2 normal. No murmur heard. Pulmonary:     Effort: Pulmonary effort is normal. No respiratory distress.     Breath sounds: Normal breath sounds. No decreased breath sounds, wheezing, rhonchi or rales.  Abdominal:     General: Bowel sounds are normal.     Palpations: Abdomen is soft.     Tenderness: There is no abdominal tenderness.  Musculoskeletal:        General: No swelling.     Cervical back: Neck supple.     Right upper leg: Normal.     Left upper leg: Swelling, edema and tenderness (With erythema of the lower thigh near the knee) present.     Right knee: Normal.     Left knee: Swelling and erythema present. Tenderness present.     Right lower leg: Normal.     Left lower leg: Swelling and tenderness (With erythema below the knee to the upper calf) present. Edema present.  Lymphadenopathy:     Head:     Right side of head: No submental, submandibular, tonsillar, preauricular or posterior auricular adenopathy.     Left side of head: No submental, submandibular, tonsillar, preauricular or posterior auricular adenopathy.      Cervical: No cervical adenopathy.     Right cervical: No superficial cervical adenopathy.    Left cervical: No superficial cervical adenopathy.  Skin:    General: Skin is warm and dry.     Capillary Refill: Capillary refill takes less than 2 seconds.     Findings: No rash.  Neurological:     Mental Status: She is alert and oriented to person, place, and time.  Psychiatric:        Mood and Affect: Mood normal.      UC Treatments / Results  Labs (all labs ordered are listed, but only abnormal results are displayed) Labs Reviewed - No data to display  EKG   Radiology No results found.  Procedures Procedures (including critical care time)  Medications Ordered in UC Medications - No data to display  Initial Impression / Assessment and Plan / UC Course  I have reviewed the triage vital signs and the nursing notes.  Pertinent labs & imaging results that were available during my care of the patient were reviewed by me and considered in my medical decision making (see chart for details).  Plan of Care: Left thigh and lower leg pain and swelling with erythema  and warmth: Encouraged to go to nearest emergency room for further workup of possible DVT versus cellulitis that might need IV antibiotics.  We do not have ultrasound available at our location today.  We also do not have onsite labs except for point-of-care testing.  She may need additional lab work.  She is going to the emergency room with a family member driving her in the family vehicle.  I reviewed the plan of care with the patient and/or the patient's guardian.  The patient and/or guardian had time to ask questions and acknowledged that the questions were answered.  I provided instruction on symptoms or reasons to return here or to go to an ER, if symptoms/condition did not improve, worsened or if new symptoms occurred.  Final Clinical Impressions(s) / UC Diagnoses   Final diagnoses:  Pain and swelling of left lower leg   Acute pain of left thigh     Discharge Instructions      Patient has swelling and pain with warmth of her left lower leg and left thigh: Encouraged to go to an emergency room for ultrasound workup of her left leg for possible DVT versus infection or sepsis.  She will go to [facility] emergency room with family driving the car.     ED Prescriptions   None    PDMP not reviewed this encounter.   Ival Domino, FNP 05/11/24 [phone]

## 2024-05-11 NOTE — ED Triage Notes (Signed)
 Pt c/o left leg pain started 3 weeks ago with itching and tingling, pt raised her pants leg up and she has redness and leg is warm to touch. Pt has a red line below her knee pt states the line keeps going down further.

## 2024-05-11 NOTE — Telephone Encounter (Addendum)
 FYI Only or Action Required?: FYI only for provider.  Patient was last seen in primary care on 01/30/2024 by Nicholaus Credit, PA-C.  Called Nurse Triage reporting Leg Pain, Leg Swelling, and Pruritis.  Symptoms began several weeks ago.  Interventions attempted: OTC medications: Aspirin and Ice/heat application.  Symptoms are: gradually worsening.  Triage Disposition: See HCP Within 4 Hours (Or PCP Triage)  Patient/caregiver understands and will follow disposition?: Yes                             Copied from CRM [id number]. Topic: Clinical - Red Word Triage >> May 11, 2024 12:00 PM Tiffany S wrote: [facility] that prompted transfer to Nurse Triage: Left leg hip and tow pain. Leg is swollen red itching Reason for Disposition  [1] SEVERE pain (e.g., excruciating, unable to do any normal activities) AND [2] not improved after 2 hours of pain medicine  Answer Assessment - Initial Assessment Questions 1. ONSET: When did the pain start?      3 weeks ago, worsening  2. LOCATION: Where is the pain located?      Left leg from hip to toe 3. PAIN: How bad is the pain?    (Scale 1-10; or mild, moderate, severe)     Rates pain a 10, states pain prevents her from walking around normally  4. WORK OR EXERCISE: Has there been any recent work or exercise that involved this part of the body?      Denies 5. CAUSE: What do you think is causing the leg pain?     Originally thought to be a skin reaction 6. OTHER SYMPTOMS: Do you have any other symptoms? (e.g., chest pain, back pain, breathing difficulty, swelling, rash, fever, numbness, weakness)     Numbness/tingling, itching, swelling, redness, denies chest pain, denies difficulty breathing, denies fever    PCP office closes at 12pm on Fridays. Advised UC. Patient verbalized understanding and agreed to go.  Protocols used: Leg Pain-A-AH

## 2024-05-11 NOTE — Discharge Instructions (Addendum)
 Patient has swelling and pain with warmth of her left lower leg and left thigh: Encouraged to go to an emergency room for ultrasound workup of her left leg for possible DVT versus infection or sepsis.  She will go to [facility] emergency room with family driving the car.

## 2024-05-17 ENCOUNTER — Other Ambulatory Visit: Payer: Self-pay | Admitting: Physician Assistant

## 2024-05-17 ENCOUNTER — Encounter: Payer: Self-pay | Admitting: Physician Assistant

## 2024-05-17 ENCOUNTER — Ambulatory Visit (INDEPENDENT_AMBULATORY_CARE_PROVIDER_SITE_OTHER): Admitting: Physician Assistant

## 2024-05-17 ENCOUNTER — Ambulatory Visit (INDEPENDENT_AMBULATORY_CARE_PROVIDER_SITE_OTHER): Payer: Medicare HMO

## 2024-05-17 VITALS — BP 128/72 | HR 80 | Temp 97.9°F | Resp 16 | Ht 59.0 in | Wt 171.2 lb

## 2024-05-17 DIAGNOSIS — I152 Hypertension secondary to endocrine disorders: Secondary | ICD-10-CM

## 2024-05-17 DIAGNOSIS — I2510 Atherosclerotic heart disease of native coronary artery without angina pectoris: Secondary | ICD-10-CM

## 2024-05-17 DIAGNOSIS — R899 Unspecified abnormal finding in specimens from other organs, systems and tissues: Secondary | ICD-10-CM

## 2024-05-17 DIAGNOSIS — L509 Urticaria, unspecified: Secondary | ICD-10-CM | POA: Diagnosis not present

## 2024-05-17 DIAGNOSIS — E1159 Type 2 diabetes mellitus with other circulatory complications: Secondary | ICD-10-CM | POA: Diagnosis not present

## 2024-05-17 DIAGNOSIS — E1169 Type 2 diabetes mellitus with other specified complication: Secondary | ICD-10-CM | POA: Insufficient documentation

## 2024-05-17 DIAGNOSIS — E042 Nontoxic multinodular goiter: Secondary | ICD-10-CM

## 2024-05-17 DIAGNOSIS — E785 Hyperlipidemia, unspecified: Secondary | ICD-10-CM | POA: Diagnosis not present

## 2024-05-17 DIAGNOSIS — E559 Vitamin D deficiency, unspecified: Secondary | ICD-10-CM | POA: Diagnosis not present

## 2024-05-17 DIAGNOSIS — Z0000 Encounter for general adult medical examination without abnormal findings: Secondary | ICD-10-CM | POA: Diagnosis not present

## 2024-05-17 DIAGNOSIS — L501 Idiopathic urticaria: Secondary | ICD-10-CM | POA: Insufficient documentation

## 2024-05-17 DIAGNOSIS — E1165 Type 2 diabetes mellitus with hyperglycemia: Secondary | ICD-10-CM | POA: Diagnosis not present

## 2024-05-17 MED ORDER — TRIAMCINOLONE ACETONIDE 40 MG/ML IJ SUSP
60.0000 mg | Freq: Once | INTRAMUSCULAR | Status: AC
Start: 2024-05-17 — End: 2024-05-17
  Administered 2024-05-17: 60 mg via INTRAMUSCULAR

## 2024-05-17 MED ORDER — PREDNISONE 20 MG PO TABS: ORAL_TABLET | ORAL | 0 refills | Status: DC

## 2024-05-17 NOTE — Progress Notes (Signed)
 Subjective:  Patient ID: Tina Perkins, female    DOB: 07-May-1941  Age: 83 y.o. MRN: [id number]  Chief Complaint  Patient presents with   Medical Management of Chronic Issues    53M  BEFORE GOING IN TO SEE PT SHE AND HER DAUGHTER TELL NURSE THEY WANT TO START SEEING A MD VERSUS PA/NP - I DID SEE HER FOR TODAY'S VISIT BUT FROM HERE SHE WOULD LIKE TO ESTABLISH WITH DR IVIN  Diabetes  Hyperlipidemia  Hypertension    Pt presents for follow up of hypertension. Patient was diagnosed in 2010 The patient is tolerating the medication well without side effects. Compliance with treatment has been good; including taking medication as directed , maintains a healthy diet and  and following up as directed.currently taking coreg  12.5mg  bid Pt also with history of PAT and CAD - pt follows with cardiologist but is overdue for appt and has not scheduled a follow up Denies chest pain, dyspnea, edema  Mixed hyperlipidemia  Pt presents with hyperlipidemia.  Compliance with treatment has been good The patient is compliant with medications, maintains a low cholesterol diet , follows up as directed ,  . The patient denies experiencing any hypercholesterolemia related symptoms. She is currently on pravachol  80mg  qd She is due for labwork  Pt with history of vitamin D  def - currently on weekly supplement - due for labwork  Pt with diabetes.  Pt states she is taking rybelsus  7mg  qd and tolerating med well however she states she has not taken this medication in over a month and only recently filled medication.  She is not taking her glucose at home at this time.   Is due for eye exam  Pt with osteoporosis - takes fosamax  70mg  weekly - last dexa scan 10/12/22  Pt has been scheduled for a mammogram but she did not call back to make appt and defers at this time  Pt very agitated and irritated today discussing the fact she has continued to have a 'migrating rash' and hives and she feels no one has addressed this  and that is when she told the nurse she would prefer to see an MD rather than an APP so that someone would do something about her rash.  At her last visit with me it was addressed (which was the first time she ever mentioned issue to me) and she was referred to an allergist.  She did go see the allergist but she states that visit was 'a joke and the MD she saw did not do a thing for her and was only there 7 minutes' - upon reviewing chart she did have labwork done which was all normal except for slightly low TSH (which will be rechecked today) and an elevated tryptase.  She did repeat the tryptase level but do not see any correspondence about that result.  She was advised to take Allegra  qd-bid which pt is doing and to call their office back to consider Xolair  (which pt/daughter has not done) - pt will have follow up with their office on 05/25/24   Pt has history of thyroid  nodules with last ultrasound 1/24 which was consistent with benign thyroid  nodules and no further testing recommended at that time.  She is currently on no thyroid  treatment - as stated above TSH done recently was slightly low - will repeat labs and do thyroid  panel with TSH  Pt was recently seen at Urgent Care then sent to ED for left knee redness/rash and had normal xray  and ultrasound. She was treated for presumed cellulitis but this could have been more of urticarial hive reaction.  Today she states her knee is fine but complains of pruritic red rash on her left forearm that came up yesterday.    Current Outpatient Medications on File Prior to Visit  Medication Sig Dispense Refill   alendronate  (FOSAMAX ) 70 MG tablet TAKE 1 TABLET BY MOUTH EVERY 7 DAYS. TAKE WITH A FULL GLASS OF WATER ON AN EMPTY STOMACH. 12 tablet 3   aspirin EC 81 MG tablet Take 81 mg by mouth daily. Swallow whole.     blood glucose meter kit and supplies KIT Dispense based on patient and insurance preference. Use up to four times daily as directed. 1 each 0    Blood Glucose Monitoring Suppl (ONETOUCH VERIO FLEX SYSTEM) w/Device KIT Check Glucose fasting and 2 hours after meals 1 kit 0   carvedilol  (COREG ) 12.5 MG tablet TAKE ONE (1) TABLET BY MOUTH TWICE DAILY WITH MEALS *REFILL REQUEST* 60 tablet 10   cephALEXin (KEFLEX) 500 MG capsule Take 500 mg by mouth every 6 (six) hours.     fexofenadine  (ALLEGRA ) 180 MG tablet Take 1 tablet (180 mg total) by mouth daily. 90 tablet 5   glucose blood (ONETOUCH VERIO) test strip Check glucose bid 300 each 3   Lancets (ONETOUCH DELICA PLUS LANCET33G) MISC USE TO TEST BLOOD SUGAR 4 TIMES DAILY AS DIRECTED 100 each 10   meclizine  (ANTIVERT ) 12.5 MG tablet TAKE 1 TABLET BY MOUTH THREE TIMES DAILY AS NEEDED FOR DIZZINESS 30 tablet 5   nitroGLYCERIN  (NITROSTAT ) 0.4 MG SL tablet Place 1 tablet (0.4 mg total) under the tongue every 5 (five) minutes as needed for chest pain. 90 tablet 3   ondansetron  (ZOFRAN ) 4 MG tablet Take 1 tablet (4 mg total) by mouth every 8 (eight) hours as needed for nausea or vomiting. 20 tablet 0   pravastatin  (PRAVACHOL ) 80 MG tablet Take 0.5 tablets (40 mg total) by mouth daily. Patient needs an appointment for further refills. 2 nd attempt 15 tablet 0   Semaglutide  (RYBELSUS ) 7 MG TABS TAKE 1 TABLET BY MOUTH ONCE DAILY 90 tablet 0   traMADol (ULTRAM) 50 MG tablet Take 50 mg by mouth 3 (three) times daily as needed.     Vitamin D , Ergocalciferol , (DRISDOL ) 1.25 MG (50000 UNIT) CAPS capsule TAKE 1 CAPSULE BY MOUTH TWICE WEEKLY 8 capsule 0   triamcinolone  cream (KENALOG ) 0.1 % Apply 1 Application topically 2 (two) times daily. (Patient not taking: Reported on 05/17/2024) 30 g 0   No current facility-administered medications on file prior to visit.   Past Medical History:  Diagnosis Date   Acute respiratory disease due to COVID-19 virus 08/12/2019   BMI 35.0-35.9,adult 03/31/2020   Chest pain, precordial 04/10/2020   Chronic bilateral low back pain with bilateral sciatica 09/15/2021   Last  Assessment & Plan:  Formatting of this note might be different from the original. 83 year old female seen today in initial consultation for her chronic back pain with bilateral lower extremity radiculopathy, left greater than right.    Will request previous lumbar spine MRI for review.  At this time, I will start patient on gabapentin 100 mg q.h.s. x2 weeks then increase her to 200 mg q.h.s.    Coronary artery disease involving native coronary artery of native heart without angina pectoris 05/10/2016   Apparently coronary intervention done in 1993 in [place]  She was fine to have 75% narrowing of the mid LAD, nitroglycerin   was given reduction to 30-40% it was also described as bridging in that area   CVA (cerebral vascular accident) (HCC)    2020   Daytime somnolence 09/15/2020   Elevated blood pressure 09/15/2021   Last Assessment & Plan:  Formatting of this note might be different from the original. Blood pressure elevated in clinic today.  Patient will continue to follow-up with his primary care provider for further management.   Essential hypertension 04/10/2020   History of hepatitis 12/14/2019   Hypovitaminosis D 10/29/2021   Mixed hyperlipidemia 12/14/2019   Neck pain 09/15/2021   Last Assessment & Plan:  Formatting of this note might be different from the original. Atraumatic, acute onset cervical spine pain and stiffness x 2-3 weeks which has been progressive without improvement.  No associated fevers or chills.  Associated headaches, left shoulder, and left upper extremity pain that radiates down into her hand and fingers. This pain is most likely radicular in nature.      Obesity (BMI 30-39.9) 04/10/2020   Pain medication agreement 09/15/2021   Last Assessment & Plan:  Formatting of this note might be different from the original. Agreement signed and placed in the patient's chart today..  UDS to be collected in compliance with clinic policies and procedures.   Patient did not display any  signs of abuse or diversion and has been checked on the [place]  Controlled Substance website.   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 Added automatically from request for surgery [id number] Formatting of this note might be different from the original. Added automatically from request for surgery [id number]   Past Surgical History:  Procedure Laterality Date   CESAREAN SECTION N/A    COLON SURGERY     TOTAL KNEE ARTHROPLASTY Bilateral    2017 and 2019    Family History  Problem Relation Age of Onset   Cancer Mother    Heart attack Mother    Heart attack Father    Cancer Father    Cancer Sister    Cancer Brother    Cancer Daughter    Social History   Socioeconomic History   Marital status: Single    Spouse name: Not on file   Number of children: 11   Years of education: 9   Highest education level: 9th grade  Occupational History   Occupation: Retired  Tobacco Use   Smoking status: Never    Passive exposure: Never   Smokeless tobacco: Never  Vaping Use   Vaping status: Never Used  Substance and Sexual Activity   Alcohol use: Never   Drug use: Never   Sexual activity: Not Currently  Other Topics Concern   Not on file  Social History Narrative   Not on file   Social Drivers of Health   Financial Resource Strain: Low Risk  (05/17/2024)   Overall Financial Resource Strain (CARDIA)    Difficulty of Paying Living Expenses: Not hard at all  Food Insecurity: No Food Insecurity (05/17/2024)   Hunger Vital Sign    Worried About Running Out of Food in the Last  Year:  Never true    Ran Out of Food in the Last Year: Never true  Transportation Needs: No Transportation Needs (05/17/2024)   PRAPARE - Administrator, Civil Service (Medical): No    Lack of Transportation (Non-Medical): No  Physical Activity: Inactive (05/17/2024)   Exercise Vital Sign    Days of Exercise per Week: 0 days    Minutes of Exercise per Session: 0 min  Stress: No Stress Concern Present (05/17/2024)   [employer] of Occupational Health - Occupational Stress Questionnaire    Feeling of Stress: Not at all  Social Connections: Socially Isolated (05/17/2024)   Social Connection and Isolation Panel    Frequency of Communication with Friends and Family: More than three times a week    Frequency of Social Gatherings with Friends and Family: More than three times a week    Attends Religious Services: Never    Database administrator or Organizations: No    Attends Banker Meetings: Never    Marital Status: Widowed   CONSTITUTIONAL: Negative for chills, fatigue, fever, unintentional weight gain and unintentional weight loss.  E/N/T: Negative for ear pain, nasal congestion and sore throat.  CARDIOVASCULAR: Negative for chest pain, dizziness, palpitations and pedal edema.  RESPIRATORY: Negative for recent cough and dyspnea.  GASTROINTESTINAL: Negative for abdominal pain, acid reflux symptoms, constipation, diarrhea, nausea and vomiting.  MSK:see HPI INTEGUMENTARY: see HPI PSYCHIATRIC: Negative for sleep disturbance and to question depression screen.  Negative for depression, negative for anhedonia.       Objective:  PHYSICAL EXAM:   VS: BP 128/72   Pulse 80   Temp 97.9 F (36.6 C)   Resp 16   Ht 4' 11 (1.499 m)   Wt 171 lb 3.2 oz (77.7 kg)   BMI 34.58 kg/m   GEN: Well nourished, well developed, in no acute distress  Cardiac: RRR; no murmurs, rubs, or gallops,no edema - n Respiratory:  normal respiratory rate and pattern with no distress -  normal breath sounds with no rales, rhonchi, wheezes or rubs  MS: no deformity or atrophy  Skin: see pic below Neuro:  Alert and Oriented x 3,- CN II-Xii grossly intact Psych: euthymic mood, - agitated     Diabetic Foot Exam - Simple   No data filed     Lab Results  Component Value Date   WBC 4.9 03/16/2024   HGB 14.5 03/16/2024   HCT 44.0 03/16/2024   PLT 235 03/16/2024   GLUCOSE 96 01/30/2024   CHOL 116 01/30/2024   TRIG 133 01/30/2024   HDL 43 01/30/2024   LDLCALC 50 01/30/2024   ALT 25 03/16/2024   AST 30 01/30/2024   NA 141 01/30/2024   K 4.7 01/30/2024   CL 107 (H) 01/30/2024   CREATININE 0.76 01/30/2024   BUN 12 01/30/2024   CO2 17 (L) 01/30/2024   TSH 0.302 (L) 03/16/2024   HGBA1C 5.6 01/30/2024      Assessment & Plan:   Problem List Items Addressed This Visit       Cardiovascular and Mediastinum   Coronary artery disease involving native coronary artery of native heart without angina pectoris   Relevant Orders   Follow up with cardiologist as directed- pt to call for follow up appt Continue meds   Hypertension associated with diabetes (HCC)   Relevant Orders   CBC with Differential/Platelet   Comprehensive metabolic panel   TSH Continue med        Thyroid  nodules  Benign - no follow up recommended with last ultrasound      Abnormal labwork Thyroid  panel with TSH pending  Hives of unknown origin Kenalog  60mg  given IM Rx prednisone  taper as directed Follow up with allergist as scheduled        Endocrine   Type 2 diabetes mellitus with hyperglycemia, without long-term current use of insulin  - Primary   Relevant Orders   Hemoglobin A1c Continue current meds   Monitor glucose Schedule optometry exam           Other   Hyperlipidemia associated with diabetes (HCC)   Relevant Orders   Lipid panel Continue med Labwork pending   Hypovitaminosis D   Relevant Orders   VITAMIN D  25 Hydroxy (Vit-D Deficiency, Fractures)                                                .  Meds ordered this encounter  Medications   triamcinolone  acetonide (KENALOG -40) injection 60 mg   predniSONE  (DELTASONE ) 20 MG tablet    Sig: 1 po tid for 3 days then 1 po bid for 3 days then 1 po qd for 3 days    Dispense:  18 tablet    Refill:  0    Supervising Provider:   SHERRE ABIGAIL BALO    Orders Placed This Encounter  Procedures   CBC with Differential/Platelet   Comprehensive metabolic panel with GFR   Thyroid  Panel With TSH   Lipid panel   Hemoglobin A1c   VITAMIN D  25 Hydroxy (Vit-D Deficiency, Fractures)     Follow-up: Return in about 3 months (around 08/17/2024) for with Dr Sirivol at pt request --- tomorrow for [facility].  An After Visit Summary was printed and given to the patient.  CAMIE JONELLE NICHOLAUS DEVONNA [facility] [phone]

## 2024-05-17 NOTE — Progress Notes (Signed)
 Subjective:   Germaine Ripp is a 83 y.o. female who presents for Medicare Annual (Subsequent) preventive examination.  Visit Complete: Virtual I connected with  Glendale Ano on 05/17/24 by a audio enabled telemedicine application and verified that I am speaking with the correct person using two identifiers.  Patient Location: Home  Provider Location: Home Office  I discussed the limitations of evaluation and management by telemedicine. The patient expressed understanding and agreed to proceed.  Vital Signs: Because this visit was a virtual/telehealth visit, some criteria may be missing or patient reported. Any vitals not documented were not able to be obtained and vitals that have been documented are patient reported.  Patient Medicare AWV questionnaire was completed by the patient on 05/17/24; I have confirmed that all information answered by patient is correct and no changes since this date.  Cardiac Risk Factors include: advanced age (>75men, >42 women);diabetes mellitus     Objective:    Today's Vitals   05/17/24 1028  PainSc: 10-Worst pain ever   There is no height or weight on file to calculate BMI.     05/17/2024   10:33 AM 05/12/2023    9:49 AM 08/20/2021   10:19 AM 07/30/2021    5:54 PM 08/12/2019   11:02 PM  Advanced Directives  Does Patient Have a Medical Advance Directive? No No No No No  Would patient like information on creating a medical advance directive? No - Patient declined No - Patient declined Yes (ED - Information included in AVS) No - Patient declined No - Patient declined    Current Medications (verified) Outpatient Encounter Medications as of 05/17/2024  Medication Sig   alendronate  (FOSAMAX ) 70 MG tablet TAKE 1 TABLET BY MOUTH EVERY 7 DAYS. TAKE WITH A FULL GLASS OF WATER ON AN EMPTY STOMACH.   aspirin EC 81 MG tablet Take 81 mg by mouth daily. Swallow whole.   blood glucose meter kit and supplies KIT Dispense based on patient and insurance  preference. Use up to four times daily as directed.   Blood Glucose Monitoring Suppl (ONETOUCH VERIO FLEX SYSTEM) w/Device KIT Check Glucose fasting and 2 hours after meals   carvedilol  (COREG ) 12.5 MG tablet TAKE ONE (1) TABLET BY MOUTH TWICE DAILY WITH MEALS *REFILL REQUEST*   cephALEXin (KEFLEX) 500 MG capsule Take 500 mg by mouth every 6 (six) hours.   fexofenadine  (ALLEGRA ) 180 MG tablet Take 1 tablet (180 mg total) by mouth daily.   glucose blood (ONETOUCH VERIO) test strip Check glucose bid   Lancets (ONETOUCH DELICA PLUS LANCET33G) MISC USE TO TEST BLOOD SUGAR 4 TIMES DAILY AS DIRECTED   meclizine  (ANTIVERT ) 12.5 MG tablet TAKE 1 TABLET BY MOUTH THREE TIMES DAILY AS NEEDED FOR DIZZINESS   meloxicam (MOBIC) 7.5 MG tablet Take 7.5 mg by mouth 2 (two) times daily as needed.   nitroGLYCERIN  (NITROSTAT ) 0.4 MG SL tablet Place 1 tablet (0.4 mg total) under the tongue every 5 (five) minutes as needed for chest pain.   ondansetron  (ZOFRAN ) 4 MG tablet Take 1 tablet (4 mg total) by mouth every 8 (eight) hours as needed for nausea or vomiting.   pravastatin  (PRAVACHOL ) 80 MG tablet Take 0.5 tablets (40 mg total) by mouth daily. Patient needs an appointment for further refills. 2 nd attempt   Semaglutide  (RYBELSUS ) 7 MG TABS TAKE 1 TABLET BY MOUTH ONCE DAILY   traMADol (ULTRAM) 50 MG tablet Take 50 mg by mouth 3 (three) times daily as needed.   triamcinolone  cream (KENALOG )  0.1 % Apply 1 Application topically 2 (two) times daily.   Vitamin D , Ergocalciferol , (DRISDOL ) 1.25 MG (50000 UNIT) CAPS capsule TAKE 1 CAPSULE BY MOUTH TWICE WEEKLY   No facility-administered encounter medications on file as of 05/17/2024.    Allergies (verified) Sulfa antibiotics and Metformin  and related   History: Past Medical History:  Diagnosis Date   Acute respiratory disease due to COVID-19 virus 08/12/2019   BMI 35.0-35.9,adult 03/31/2020   Chest pain, precordial 04/10/2020   Chronic bilateral low back pain with  bilateral sciatica 09/15/2021   Last Assessment & Plan:  Formatting of this note might be different from the original. 83 year old female seen today in initial consultation for her chronic back pain with bilateral lower extremity radiculopathy, left greater than right.    Will request previous lumbar spine MRI for review.  At this time, I will start patient on gabapentin 100 mg q.h.s. x2 weeks then increase her to 200 mg q.h.s.    Coronary artery disease involving native coronary artery of native heart without angina pectoris 05/10/2016   Apparently coronary intervention done in 1993 in [place]  She was fine to have 75% narrowing of the mid LAD, nitroglycerin  was given reduction to 30-40% it was also described as bridging in that area   CVA (cerebral vascular accident) (HCC)    2020   Daytime somnolence 09/15/2020   Elevated blood pressure 09/15/2021   Last Assessment & Plan:  Formatting of this note might be different from the original. Blood pressure elevated in clinic today.  Patient will continue to follow-up with his primary care provider for further management.   Essential hypertension 04/10/2020   History of hepatitis 12/14/2019   Hypovitaminosis D 10/29/2021   Mixed hyperlipidemia 12/14/2019   Neck pain 09/15/2021   Last Assessment & Plan:  Formatting of this note might be different from the original. Atraumatic, acute onset cervical spine pain and stiffness x 2-3 weeks which has been progressive without improvement.  No associated fevers or chills.  Associated headaches, left shoulder, and left upper extremity pain that radiates down into her hand and fingers. This pain is most likely radicular in nature.      Obesity (BMI 30-39.9) 04/10/2020   Pain medication agreement 09/15/2021   Last Assessment & Plan:  Formatting of this note might be different from the original. Agreement signed and placed in the patient's chart today..  UDS to be collected in compliance with clinic policies and  procedures.   Patient did not display any signs of abuse or diversion and has been checked on the [place]  Controlled Substance website.   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 Added automatically from request for surgery [id number] Formatting of this note might be different from the original. Added automatically from request for surgery [id number]   Past Surgical History:  Procedure Laterality Date   CESAREAN SECTION N/A    COLON SURGERY     TOTAL KNEE ARTHROPLASTY Bilateral    2017 and 2019   Family History  Problem Relation Age of Onset   Cancer Mother    Heart attack  Mother    Heart attack Father    Cancer Father    Cancer Sister    Cancer Brother    Cancer Daughter    Social History   Socioeconomic History   Marital status: Single    Spouse name: Not on file   Number of children: 11   Years of education: 9   Highest education level: 9th grade  Occupational History   Occupation: Retired  Tobacco Use   Smoking status: Never    Passive exposure: Never   Smokeless tobacco: Never  Vaping Use   Vaping status: Never Used  Substance and Sexual Activity   Alcohol use: Never   Drug use: Never   Sexual activity: Not Currently  Other Topics Concern   Not on file  Social History Narrative   Not on file   Social Drivers of Health   Financial Resource Strain: Low Risk  (05/12/2023)   Overall Financial Resource Strain (CARDIA)    Difficulty of Paying Living Expenses: Not hard at all  Food Insecurity: No Food Insecurity (08/11/2022)   Hunger Vital Sign     Worried About Running Out of Food in the Last Year: Never true    Ran Out of Food in the Last Year: Never true  Transportation Needs: No Transportation Needs (05/12/2023)   PRAPARE - Administrator, Civil Service (Medical): No    Lack of Transportation (Non-Medical): No  Physical Activity: Inactive (05/12/2023)   Exercise Vital Sign    Days of Exercise per Week: 0 days    Minutes of Exercise per Session: 0 min  Stress: No Stress Concern Present (05/12/2023)   [employer] of Occupational Health - Occupational Stress Questionnaire    Feeling of Stress : Not at all  Social Connections: Socially Isolated (05/12/2023)   Social Connection and Isolation Panel    Frequency of Communication with Friends and Family: More than three times a week    Frequency of Social Gatherings with Friends and Family: More than three times a week    Attends Religious Services: Never    Database administrator or Organizations: No    Attends Banker Meetings: Never    Marital Status: Widowed    Tobacco Counseling Counseling given: Not Answered   Clinical Intake:     Pain : 0-10 Pain Score: 10-Worst pain ever Pain Type: Neuropathic pain, Chronic pain Pain Location: Leg Pain Orientation: Left, Lower Pain Descriptors / Indicators: Aching, Constant, Burning, Numbness, Sharp, Tightness Pain Onset: In the past 7 days Pain Frequency: Constant     BMI - recorded: 33.5 Nutritional Status: BMI > 30  Obese Nutritional Risks: None Diabetes: Yes CBG done?: No Did pt. bring in CBG monitor from home?: No  How often do you need to have someone help you when you read instructions, pamphlets, or other written materials from your doctor or pharmacy?: 1 - Never  Interpreter Needed?: No      Activities of Daily Living    05/17/2024   10:32 AM  In your present state of health, do you have any difficulty performing the following activities:  Hearing? 0  Vision? 0  Difficulty  concentrating or making decisions? 1  Walking or climbing stairs? 0  Dressing or bathing? 0  Doing errands, shopping? 0  Preparing Food and eating ? N  Using the Toilet? N  In the past six months, have you accidently leaked urine? N  Do you have problems with loss of bowel control?  Y  Managing your Medications? N  Managing your Finances? N  Housekeeping or managing your Housekeeping? N    Patient Care Team: Nicholaus Credit, PA-C as PCP - General (Physician Assistant) Nyle Rankin POUR, [facility] (Inactive) as Pharmacist (Pharmacist)  Indicate any recent Medical Services you may have received from other than [facility] providers in the past year (date may be approximate).     Assessment:   This is a routine wellness examination for Aysha.  Hearing/Vision screen No results found.   Goals Addressed   None    Depression Screen    05/17/2024   10:32 AM 01/30/2024   10:13 AM 05/31/2023   11:21 AM 05/12/2023    9:47 AM 02/11/2023   10:26 AM 01/17/2023   10:23 AM 01/17/2023   10:22 AM  PHQ 2/9 Scores  PHQ - 2 Score 0 5 3 0 1 1 0  PHQ- 9 Score  14 11  7 7      Fall Risk    05/17/2024   10:32 AM 01/30/2024   10:13 AM 05/12/2023    9:49 AM 02/11/2023   10:25 AM 01/17/2023   10:24 AM  Fall Risk   Falls in the past year? 0 1 0 0 0  Number falls in past yr: 0 0 0 0 0  Injury with Fall? 0 0 0 0 0  Risk for fall due to : No Fall Risks History of fall(s);No Fall Risks No Fall Risks No Fall Risks No Fall Risks  Follow up Falls prevention discussed Falls evaluation completed Falls prevention discussed Falls evaluation completed;Falls prevention discussed Falls evaluation completed    MEDICARE RISK AT HOME: Medicare Risk at Home Any stairs in or around the home?: Yes If so, are there any without handrails?: Yes Home free of loose throw rugs in walkways, pet beds, electrical cords, etc?: Yes Adequate lighting in your home to reduce risk of falls?: Yes Life alert?: Yes Use of a cane, walker or w/c?:  No Grab bars in the bathroom?: No Shower chair or bench in shower?: No Elevated toilet seat or a handicapped toilet?: No  TIMED UP AND GO:  Was the test performed?  No    Cognitive Function:        05/17/2024   10:39 AM 05/12/2023    9:49 AM 02/18/2022    1:14 PM  6CIT Screen  What Year? 0 points 0 points 0 points  What month? 0 points 0 points 0 points  What time? 0 points 0 points 0 points  Count back from 20 0 points 0 points 0 points  Months in reverse 0 points 0 points 0 points  Repeat phrase 0 points 0 points 2 points  Total Score 0 points 0 points 2 points    Immunizations  There is no immunization history on file for this patient.  TDAP status: Due, Education has been provided regarding the importance of this vaccine. Advised may receive this vaccine at local pharmacy or Health Dept. Aware to provide a copy of the vaccination record if obtained from local pharmacy or Health Dept. Verbalized acceptance and understanding.  Flu Vaccine status: Due, Education has been provided regarding the importance of this vaccine. Advised may receive this vaccine at local pharmacy or Health Dept. Aware to provide a copy of the vaccination record if obtained from local pharmacy or Health Dept. Verbalized acceptance and understanding.  Pneumococcal vaccine status: Declined,  Education has been provided regarding the importance of this vaccine but patient still declined. Advised  may receive this vaccine at local pharmacy or Health Dept. Aware to provide a copy of the vaccination record if obtained from local pharmacy or Health Dept. Verbalized acceptance and understanding.   Covid-19 vaccine status: Declined, Education has been provided regarding the importance of this vaccine but patient still declined. Advised may receive this vaccine at local pharmacy or Health Dept.or vaccine clinic. Aware to provide a copy of the vaccination record if obtained from local pharmacy or Health Dept. Verbalized  acceptance and understanding.  Qualifies for Shingles Vaccine? Yes   Zostavax completed No   Shingrix  Completed?: No.    Education has been provided regarding the importance of this vaccine. Patient has been advised to call insurance company to determine out of pocket expense if they have not yet received this vaccine. Advised may also receive vaccine at local pharmacy or Health Dept. Verbalized acceptance and understanding.  Screening Tests Health Maintenance  Topic Date Due   OPHTHALMOLOGY EXAM  Never done   Diabetic kidney evaluation - Urine ACR  09/18/2023   INFLUENZA VACCINE  05/04/2024   DTaP/Tdap/Td (1 - Tdap) 09/29/2024 (Originally 05/29/1960)   MAMMOGRAM  09/29/2024 (Originally 08/21/2023)   FOOT EXAM  05/30/2024   HEMOGLOBIN A1C  07/31/2024   Diabetic kidney evaluation - eGFR measurement  01/29/2025   Medicare Annual Wellness (AWV)  05/17/2025   DEXA SCAN  Completed   HPV VACCINES  Aged Out   Meningococcal B Vaccine  Aged Out   Pneumococcal Vaccine: 50+ Years  Discontinued   COVID-19 Vaccine  Discontinued   Hepatitis C Screening  Discontinued   Zoster Vaccines- Shingrix   Discontinued    Health Maintenance  Health Maintenance Due  Topic Date Due   OPHTHALMOLOGY EXAM  Never done   Diabetic kidney evaluation - Urine ACR  09/18/2023   INFLUENZA VACCINE  05/04/2024    Colorectal cancer screening: No longer required.   Mammogram status: Ordered 01/2024. Pt provided with contact info and advised to call to schedule appt.   Bone Density status: Completed 10/12/2022. Results reflect: Bone density results: OSTEOPENIA. Repeat every 2 years.  Lung Cancer Screening: (Low Dose CT Chest recommended if Age 63-80 years, 20 pack-year currently smoking OR have quit w/in 15years.) does not qualify.    Additional Screening:  Hepatitis C Screening: does qualify; Completed 12/14/2019  Vision Screening: Recommended annual ophthalmology exams for early detection of glaucoma and other  disorders of the eye. Is the patient up to date with their annual eye exam?  No  Who is the provider or what is the name of the office in which the patient attends annual eye exams? N/A If pt is not established with a provider, would they like to be referred to a provider to establish care? Yes .   Dental Screening: Recommended annual dental exams for proper oral hygiene  Diabetic Foot Exam: Diabetic Foot Exam: Completed 05/30/24  Community Resource Referral / Chronic Care Management: CRR required this visit?  No   CCM required this visit?  No     Plan:     I have personally reviewed and noted the following in the patient's chart:   Medical and social history Use of alcohol, tobacco or illicit drugs  Current medications and supplements including opioid prescriptions. Patient is currently taking opioid prescriptions. Information provided to patient regarding non-opioid alternatives. Patient advised to discuss non-opioid treatment plan with their provider. Functional ability and status Nutritional status Physical activity Advanced directives List of other physicians Hospitalizations, surgeries, and ER visits  in previous 12 months Vitals Screenings to include cognitive, depression, and falls Referrals and appointments  In addition, I have reviewed and discussed with patient certain preventive protocols, quality metrics, and best practice recommendations. A written personalized care plan for preventive services as well as general preventive health recommendations were provided to patient.     Laney LITTIE Ferries, CMA   05/17/2024   After Visit Summary: (Declined) Due to this being a telephonic visit, with patients personalized plan was offered to patient but patient Declined AVS at this time   Nurse Notes: Patient stated she has a order at [facility] for her mammogram, she has to call and reschedule it. Referral was ordered for eye doctor, as patient request.

## 2024-05-18 ENCOUNTER — Other Ambulatory Visit

## 2024-05-18 DIAGNOSIS — E1165 Type 2 diabetes mellitus with hyperglycemia: Secondary | ICD-10-CM

## 2024-05-18 DIAGNOSIS — R899 Unspecified abnormal finding in specimens from other organs, systems and tissues: Secondary | ICD-10-CM | POA: Diagnosis not present

## 2024-05-18 DIAGNOSIS — E1159 Type 2 diabetes mellitus with other circulatory complications: Secondary | ICD-10-CM

## 2024-05-18 DIAGNOSIS — E1169 Type 2 diabetes mellitus with other specified complication: Secondary | ICD-10-CM

## 2024-05-18 DIAGNOSIS — E785 Hyperlipidemia, unspecified: Secondary | ICD-10-CM | POA: Diagnosis not present

## 2024-05-18 DIAGNOSIS — E559 Vitamin D deficiency, unspecified: Secondary | ICD-10-CM | POA: Diagnosis not present

## 2024-05-18 DIAGNOSIS — I152 Hypertension secondary to endocrine disorders: Secondary | ICD-10-CM | POA: Diagnosis not present

## 2024-05-18 DIAGNOSIS — L509 Urticaria, unspecified: Secondary | ICD-10-CM | POA: Diagnosis not present

## 2024-05-19 LAB — CBC WITH DIFFERENTIAL/PLATELET
Basophils Absolute: 0 x10E3/uL (ref 0.0–0.2)
Basos: 0 %
EOS (ABSOLUTE): 0 x10E3/uL (ref 0.0–0.4)
Eos: 0 %
Hematocrit: 44.3 % (ref 34.0–46.6)
Hemoglobin: 14.2 g/dL (ref 11.1–15.9)
Immature Grans (Abs): 0 x10E3/uL (ref 0.0–0.1)
Immature Granulocytes: 0 %
Lymphocytes Absolute: 1.6 x10E3/uL (ref 0.7–3.1)
Lymphs: 17 %
MCH: 29.2 pg (ref 26.6–33.0)
MCHC: 32.1 g/dL (ref 31.5–35.7)
MCV: 91 fL (ref 79–97)
Monocytes Absolute: 0.6 x10E3/uL (ref 0.1–0.9)
Monocytes: 6 %
Neutrophils Absolute: 7.3 x10E3/uL — ABNORMAL HIGH (ref 1.4–7.0)
Neutrophils: 77 %
Platelets: 226 x10E3/uL (ref 150–450)
RBC: 4.86 x10E6/uL (ref 3.77–5.28)
RDW: 11.6 % — ABNORMAL LOW (ref 11.7–15.4)
WBC: 9.6 x10E3/uL (ref 3.4–10.8)

## 2024-05-19 LAB — HEMOGLOBIN A1C
Est. average glucose Bld gHb Est-mCnc: 111 mg/dL
Hgb A1c MFr Bld: 5.5 % (ref 4.8–5.6)

## 2024-05-19 LAB — VITAMIN D 25 HYDROXY (VIT D DEFICIENCY, FRACTURES): Vit D, 25-Hydroxy: 44.4 ng/mL (ref 30.0–100.0)

## 2024-05-19 LAB — LIPID PANEL
Chol/HDL Ratio: 3.3 ratio (ref 0.0–4.4)
Cholesterol, Total: 165 mg/dL (ref 100–199)
HDL: 50 mg/dL (ref >39)
LDL Chol Calc (NIH): 93 mg/dL (ref 0–99)
Triglycerides: 125 mg/dL (ref 0–149)
VLDL Cholesterol Cal: 22 mg/dL (ref 5–40)

## 2024-05-19 LAB — COMPREHENSIVE METABOLIC PANEL WITH GFR
ALT: 22 IU/L (ref 0–32)
AST: 22 IU/L (ref 0–40)
Albumin: 4.3 g/dL (ref 3.7–4.7)
Alkaline Phosphatase: 49 IU/L (ref 44–121)
BUN/Creatinine Ratio: 25 (ref 12–28)
BUN: 15 mg/dL (ref 8–27)
Bilirubin Total: 0.5 mg/dL (ref 0.0–1.2)
CO2: 16 mmol/L — ABNORMAL LOW (ref 20–29)
Calcium: 9.4 mg/dL (ref 8.7–10.3)
Chloride: 107 mmol/L — ABNORMAL HIGH (ref 96–106)
Creatinine, Ser: 0.6 mg/dL (ref 0.57–1.00)
Globulin, Total: 2.2 g/dL (ref 1.5–4.5)
Glucose: 134 mg/dL — ABNORMAL HIGH (ref 70–99)
Potassium: 4.7 mmol/L (ref 3.5–5.2)
Sodium: 140 mmol/L (ref 134–144)
Total Protein: 6.5 g/dL (ref 6.0–8.5)
eGFR: 90 mL/min/1.73 (ref >59)

## 2024-05-19 LAB — THYROID PANEL WITH TSH
Free Thyroxine Index: 1.8 (ref 1.2–4.9)
T3 Uptake Ratio: 22 % — ABNORMAL LOW (ref 24–39)
T4, Total: 8.2 ug/dL (ref 4.5–12.0)
TSH: 0.262 u[IU]/mL — ABNORMAL LOW (ref 0.450–4.500)

## 2024-05-21 ENCOUNTER — Other Ambulatory Visit: Payer: Self-pay | Admitting: Physician Assistant

## 2024-05-21 DIAGNOSIS — I10 Essential (primary) hypertension: Secondary | ICD-10-CM

## 2024-05-21 DIAGNOSIS — I2510 Atherosclerotic heart disease of native coronary artery without angina pectoris: Secondary | ICD-10-CM

## 2024-05-22 ENCOUNTER — Ambulatory Visit: Payer: Self-pay | Admitting: Physician Assistant

## 2024-05-23 ENCOUNTER — Other Ambulatory Visit: Payer: Self-pay | Admitting: Physician Assistant

## 2024-05-23 DIAGNOSIS — E0590 Thyrotoxicosis, unspecified without thyrotoxic crisis or storm: Secondary | ICD-10-CM | POA: Insufficient documentation

## 2024-05-23 DIAGNOSIS — E042 Nontoxic multinodular goiter: Secondary | ICD-10-CM

## 2024-05-25 ENCOUNTER — Encounter: Payer: Self-pay | Admitting: Internal Medicine

## 2024-05-25 ENCOUNTER — Ambulatory Visit: Admitting: Internal Medicine

## 2024-05-25 VITALS — BP 126/72 | HR 61 | Resp 16

## 2024-05-25 DIAGNOSIS — R748 Abnormal levels of other serum enzymes: Secondary | ICD-10-CM | POA: Diagnosis not present

## 2024-05-25 DIAGNOSIS — L501 Idiopathic urticaria: Secondary | ICD-10-CM | POA: Diagnosis not present

## 2024-05-25 MED ORDER — MONTELUKAST SODIUM 10 MG PO TABS: 10.0000 mg | ORAL_TABLET | Freq: Every day | ORAL | 1 refills | Status: AC

## 2024-05-25 MED ORDER — EPINEPHRINE 0.3 MG/0.3ML IJ SOAJ: 0.3000 mg | INTRAMUSCULAR | 1 refills | Status: AC | PRN

## 2024-05-25 MED ORDER — FAMOTIDINE 20 MG PO TABS: 20.0000 mg | ORAL_TABLET | Freq: Two times a day (BID) | ORAL | 5 refills | Status: AC

## 2024-05-25 MED ORDER — OMALIZUMAB 300 MG/2  ML ~~LOC~~ SOSY
600.0000 mg | PREFILLED_SYRINGE | SUBCUTANEOUS | Status: AC
  Administered 2024-05-25 – 2024-11-08 (×7): 600 mg via SUBCUTANEOUS

## 2024-05-25 NOTE — Progress Notes (Signed)
 Started Xolair  300mg  sample today for urticaria.  Will continue 300mg  every 4 weeks. No issues after observed wait time. Consent signed. Epipen  available.

## 2024-05-25 NOTE — Patient Instructions (Addendum)
 Chronic Idiopathic Urticaria: not controlled with elevated tryptase level   Therapy Plan:  - Continue allegra  (fexofenadine ) 180mg  twice daily  - Start pepcid  20mg  twice dialy  - Start singulair  10mg  daily  - Start xolair  300mg  every 4 weeks, first injection given today via sample in clinic   - informed consent, epipen  and AAP provided today   - Our Biologic coordinator Tammy will be reaching out to you in regards to approval process   Will get peripheral C-KIT mutation analysis   Can purchase fexofenadine  cheaply from [facility], [employer], [employer] or amazon.  Generic is fine.   Recommend avoiding any sedating antihistamines as this can be associated with dementia   Follow up: in 3 months

## 2024-05-25 NOTE — Progress Notes (Signed)
 FOLLOW UP Date of Service/Encounter:  05/25/24  Subjective:  Tina Perkins (DOB: 12/23/1940) is a 83 y.o. female who [facility] in re-evaluation of the following: CSU/A, elevated tryptase History obtained from: chart review and patient and daughter.  For Review, LV was on 03/16/24  with Dr. Lorin seen for intial visit for urticaria and itch . See below for summary of history and diagnostics.   Today presents for follow-up. Discussed the use of AI scribe software for clinical note transcription with the patient, who gave verbal consent to proceed.  History of Present Illness Tina Perkins is an 83 year old female who presents with chronic itching and swelling.  Pruritus and edema - Persistent pruritus and swelling for approximately five years, with recent exacerbations - Edema involving the leg, with swelling to twice the size of the contralateral limb - Edema of the arm with skin splitting and associated black and blue discoloration - Pruritus is constant and affects the entire body from toes to head - Current symptoms are severe and unrelieved by current therapies - Significant impact on quality of life  Prior interventions and diagnostic evaluation - Sought care at urgent care and [facility] due to symptom severity - Underwent sonogram, x-ray, and blood tests at the hospital - Received a prednisone  injection and discharged on prednisone  taper  - Takes Allegra  up to three times daily without relief  Laboratory findings - Elevated tryptase levels (19.2, 20)   Relevant medical history - History of blood clots, which is a concern with certain treatments  Open to starting xolair       All medications reviewed by clinical staff and updated in chart. No new pertinent medical or surgical history except as noted in HPI.  ROS: All others negative except as noted per HPI.   Objective:  BP 126/72   Pulse 61   Resp 16    SpO2 97%  There is no height or weight on file to calculate BMI. Physical Exam: General Appearance:  Alert, cooperative, no distress, appears stated age  Head:  Normocephalic, without obvious abnormality, atraumatic  Eyes:  Conjunctiva clear, EOM's intact  Ears EACs normal bilaterally  Nose: Nares normal, no rhinnorrhea   Throat: Lips, tongue normal; teeth and gums normal, MMM  Neck: Supple, symmetrical  Lungs:    , Respirations unlabored, no coughing  Heart:   , Appears well perfused  Extremities: No edema  Skin: Excoriation marks on arms  and Skin color, texture, turgor normal  Neurologic: No gross deficits   Labs:  Lab Orders         KIT (D816V) Digital PCR        Assessment/Plan   Patient Instructions  Chronic Idiopathic Urticaria: not controlled with elevated tryptase level   Therapy Plan:  - Continue allegra  (fexofenadine ) 180mg  twice daily  - Start pepcid  20mg  twice dialy  - Start singulair  10mg  daily  - Start xolair  300mg  every 4 weeks, first injection given today via sample in clinic   - informed consent, epipen  and AAP provided today   - Our Biologic coordinator Tammy will be reaching out to you in regards to approval process   Will get peripheral C-KIT mutation analysis   Can purchase fexofenadine  cheaply from [facility], [employer]co, [employer] or amazon.  Generic is fine.   Recommend avoiding any sedating antihistamines as this can be associated with dementia   Follow up: in 3 months   Other: samples provided of: xolair   and biologic given in clinic today  Thank you so much for letting me partake in your care today.  Don't hesitate to reach out if you have any additional concerns!  Hargis Springer, MD  [facility] [place], [place]

## 2024-05-28 ENCOUNTER — Telehealth: Payer: Self-pay | Admitting: *Deleted

## 2024-05-28 MED ORDER — XOLAIR 300 MG/2ML ~~LOC~~ SOSY
300.0000 mg | PREFILLED_SYRINGE | SUBCUTANEOUS | 11 refills | Status: DC
  Filled 2024-06-15 (×2): qty 2, 28d supply, fill #0
  Filled 2024-07-12: qty 2, 28d supply, fill #1

## 2024-05-28 NOTE — Telephone Encounter (Signed)
 error

## 2024-05-28 NOTE — Telephone Encounter (Signed)
 Called patient and advised approvl for XOlair  and submit to [facility], will reach out once delivery set to make appt for next injection

## 2024-05-28 NOTE — Telephone Encounter (Signed)
-----   Message from Hargis Springer sent at 05/25/2024 12:52 PM EDT ----- Can we start this patient on xolair  300mg  every 4 weeks

## 2024-05-29 ENCOUNTER — Other Ambulatory Visit: Payer: Self-pay

## 2024-05-30 ENCOUNTER — Other Ambulatory Visit: Payer: Self-pay

## 2024-06-01 ENCOUNTER — Other Ambulatory Visit (HOSPITAL_COMMUNITY): Payer: Self-pay

## 2024-06-01 ENCOUNTER — Ambulatory Visit: Admitting: Physician Assistant

## 2024-06-04 LAB — KIT (D816V) DIGITAL PCR: CKIT Result: NEGATIVE

## 2024-06-12 ENCOUNTER — Ambulatory Visit: Payer: Self-pay | Admitting: Internal Medicine

## 2024-06-12 NOTE — Progress Notes (Signed)
 Genetic testing for systemic mastocytosis was negative which is good news.  At this point I recommend we continue to monitor and treat symptoms with xolair  and go from there.  Id like to see her back in clinic in 3 months

## 2024-06-15 ENCOUNTER — Other Ambulatory Visit: Payer: Self-pay

## 2024-06-15 ENCOUNTER — Other Ambulatory Visit (HOSPITAL_COMMUNITY): Payer: Self-pay

## 2024-06-15 NOTE — Progress Notes (Signed)
 Initial fill has been completed in [place].

## 2024-06-15 NOTE — Progress Notes (Signed)
 Specialty Pharmacy Initial Fill Coordination Note  Tina Perkins is a 83 y.o. female contacted today regarding initial fill of specialty medication(s) Omalizumab  (Xolair )   Patient requested Courier to Provider Office   Delivery date: 06/19/24   Verified address: [address]. [place] [address]   Medication will be filled on 06/18/2024.   Patient is aware of $0.00 copayment.

## 2024-06-15 NOTE — Progress Notes (Signed)
 Specialty Pharmacy Initiation Note   Tina Perkins is a 83 y.o. female who will be followed by the specialty pharmacy service for RxSp Allergy    Review of administration, indication, effectiveness, safety, potential side effects, storage/disposable, and missed dose instructions occurred today for patient's specialty medication(s) Omalizumab  (Xolair )     Patient/Caregiver did not have any additional questions or concerns.   Patient's therapy is appropriate to: Continue (Patient had initial dose in office 8/22)    Goals Addressed             This Visit's Progress    Reduce signs and symptoms       Patient is initiating therapy. Patient will maintain adherence         Delon CHRISTELLA Brow Specialty Pharmacist

## 2024-06-18 ENCOUNTER — Other Ambulatory Visit (HOSPITAL_COMMUNITY): Payer: Self-pay

## 2024-06-21 ENCOUNTER — Ambulatory Visit (INDEPENDENT_AMBULATORY_CARE_PROVIDER_SITE_OTHER): Admitting: *Deleted

## 2024-06-21 DIAGNOSIS — L501 Idiopathic urticaria: Secondary | ICD-10-CM | POA: Diagnosis not present

## 2024-07-05 ENCOUNTER — Other Ambulatory Visit: Payer: Self-pay

## 2024-07-10 ENCOUNTER — Other Ambulatory Visit: Payer: Self-pay

## 2024-07-12 ENCOUNTER — Other Ambulatory Visit: Payer: Self-pay

## 2024-07-12 ENCOUNTER — Other Ambulatory Visit: Payer: Self-pay | Admitting: Pharmacy Technician

## 2024-07-12 NOTE — Progress Notes (Signed)
 Specialty Pharmacy Refill Coordination Note  Tina Perkins is a 83 y.o. female assessed today regarding refills of clinic administered specialty medication(s) Omalizumab  (Xolair )   Clinic requested Courier to Provider Office   Delivery date: 07/16/24   Verified address: [facility]   Medication will be filled on 07/13/24.

## 2024-07-13 ENCOUNTER — Other Ambulatory Visit: Payer: Self-pay

## 2024-07-13 ENCOUNTER — Other Ambulatory Visit (HOSPITAL_COMMUNITY): Payer: Self-pay

## 2024-07-19 ENCOUNTER — Ambulatory Visit (INDEPENDENT_AMBULATORY_CARE_PROVIDER_SITE_OTHER): Admitting: *Deleted

## 2024-07-19 DIAGNOSIS — L501 Idiopathic urticaria: Secondary | ICD-10-CM | POA: Diagnosis not present

## 2024-07-24 DIAGNOSIS — I639 Cerebral infarction, unspecified: Secondary | ICD-10-CM | POA: Insufficient documentation

## 2024-07-25 NOTE — Progress Notes (Unsigned)
 Cardiology Office Note:    Date:  07/26/2024   ID:  Tina Perkins, DOB July 03, 1941, MRN [id number]  PCP:  Nicholaus Credit, PA-C  Cardiologist:  Redell Leiter, MD    Referring MD: Sirivol, Mamatha, MD    ASSESSMENT:    1. Mild CAD   2. Mixed hyperlipidemia   3. Essential hypertension   4. PAT (paroxysmal atrial tachycardia)    PLAN:    In order of problems listed above:  Doing well no anginal discomfort continue medical therapy including aspirin and currently on no lipid-lowering treatment BP at target continue current therapy I offered her prescription for as needed beta-blocker she declines   Next appointment: 1 year   Medication Adjustments/Labs and Tests Ordered: Current medicines are reviewed at length with the patient today.  Concerns regarding medicines are outlined above.  Orders Placed This Encounter  Procedures   EKG 12-Lead   No orders of the defined types were placed in this encounter.    History of Present Illness:    Tina Perkins is a 83 y.o. female with a hx of type 2 diabetes hypertension hyperlipidemia paroxysmal atrial tachycardia very mild nonobstructive CAD with remote myocardial infarction in 1992 and a calcium score of 12 last seen 10/12/2022. Compliance with diet, lifestyle and medications: Yes  Doing well from a cardiology perspective no anginal discomfort sustained or severe palpitation tolerates her statin without muscle pain or weakness and she has had no edema orthopnea chest pain or syncope Past Medical History:  Diagnosis Date   Acute respiratory disease due to COVID-19 virus 08/12/2019   BMI 35.0-35.9,adult 03/31/2020   Chest pain, precordial 04/10/2020   Chronic bilateral low back pain with bilateral sciatica 09/15/2021   Last Assessment & Plan:  Formatting of this note might be different from the original. 83 year old female seen today in initial consultation for her chronic back pain with bilateral lower extremity radiculopathy, left  greater than right.    Will request previous lumbar spine MRI for review.  At this time, I will start patient on gabapentin 100 mg q.h.s. x2 weeks then increase her to 200 mg q.h.s.    Coronary artery disease involving native coronary artery of native heart without angina pectoris 05/10/2016   Apparently coronary intervention done in 1993 in [place]  She was fine to have 75% narrowing of the mid LAD, nitroglycerin  was given reduction to 30-40% it was also described as bridging in that area   CVA (cerebral vascular accident) (HCC)    2020   Daytime somnolence 09/15/2020   Essential hypertension 04/10/2020   History of hepatitis 12/14/2019   Hypovitaminosis D 10/29/2021   Mixed hyperlipidemia 12/14/2019   Neck pain 09/15/2021   Last Assessment & Plan:  Formatting of this note might be different from the original. Atraumatic, acute onset cervical spine pain and stiffness x 2-3 weeks which has been progressive without improvement.  No associated fevers or chills.  Associated headaches, left shoulder, and left upper extremity pain that radiates down into her hand and fingers. This pain is most likely radicular in nature.      Obesity (BMI 30-39.9) 04/10/2020   Pain medication agreement 09/15/2021   Last Assessment & Plan:  Formatting of this note might be different from the original. Agreement signed and placed in the patient's chart today..  UDS to be collected in compliance with clinic policies and procedures.   Patient did not display any signs of abuse or diversion and has been checked on the [place]  Controlled Substance  website.   Patent foramen ovale 12/14/2019   Pneumonia due to COVID-19 virus 08/12/2019   Pneumonia due to COVID-19 virus 08/12/2019   Prediabetes 12/28/2019   Pruritus 12/14/2019   Second degree AV block, Mobitz type I 09/15/2020   Sequelae of cerebral infarction 12/14/2019   Shortness of breath 04/10/2020   Snoring 09/15/2020   Spinal stenosis, lumbar region with  neurogenic claudication 02/28/2019   Formatting of this note might be different from the original. Added automatically from request for surgery [id number] Formatting of this note might be different from the original. Added automatically from request for surgery [id number]    Current Medications: Current Meds  Medication Sig   aspirin EC 81 MG tablet Take 81 mg by mouth daily. Swallow whole.   blood glucose meter kit and supplies KIT Dispense based on patient and insurance preference. Use up to four times daily as directed.   Blood Glucose Monitoring Suppl (ONETOUCH VERIO FLEX SYSTEM) w/Device KIT Check Glucose fasting and 2 hours after meals   EPINEPHrine  0.3 mg/0.3 mL IJ SOAJ injection Inject 0.3 mg into the muscle as needed for anaphylaxis.   famotidine  (PEPCID ) 20 MG tablet Take 1 tablet (20 mg total) by mouth 2 (two) times daily.   fexofenadine  (ALLEGRA ) 180 MG tablet Take 1 tablet (180 mg total) by mouth daily.   glucose blood (ONETOUCH VERIO) test strip Check glucose bid   Lancets (ONETOUCH DELICA PLUS LANCET33G) MISC USE TO TEST BLOOD SUGAR 4 TIMES DAILY AS DIRECTED   meclizine  (ANTIVERT ) 12.5 MG tablet TAKE 1 TABLET BY MOUTH THREE TIMES DAILY AS NEEDED FOR DIZZINESS   montelukast  (SINGULAIR ) 10 MG tablet Take 1 tablet (10 mg total) by mouth at bedtime.   nitroGLYCERIN  (NITROSTAT ) 0.4 MG SL tablet Place 1 tablet (0.4 mg total) under the tongue every 5 (five) minutes as needed for chest pain.   ondansetron  (ZOFRAN ) 4 MG tablet Take 1 tablet (4 mg total) by mouth every 8 (eight) hours as needed for nausea or vomiting.   Vitamin D , Ergocalciferol , (DRISDOL ) 1.25 MG (50000 UNIT) CAPS capsule Take 50,000 Units by mouth 2 (two) times a week.   Current Facility-Administered Medications for the 07/26/24 encounter (Office Visit) with Monetta Redell PARAS, MD  Medication   omalizumab  (XOLAIR ) prefilled syringe 600 mg      EKGs/Labs/Other Studies Reviewed:    The following studies were reviewed  today:  Cardiac Studies & Procedures   ______________________________________________________________________________________________     ECHOCARDIOGRAM  ECHOCARDIOGRAM COMPLETE 11/04/2021  Narrative ECHOCARDIOGRAM REPORT    Patient Name:   AYRIANA WIX Date of Exam: 11/04/2021 Medical Rec #:  [id number]     Height:       59.0 in Accession #:    [id number]    Weight:       181.4 lb Date of Birth:  12-18-1940     BSA:          1.769 m Patient Age:    80 years      BP:           136/80 mmHg Patient Gender: F             HR:           79 bpm. Exam Location:  [place]  Procedure: 2D Echo, Cardiac Doppler, Color Doppler and Strain Analysis  Indications:    Coronary artery disease involving native coronary artery of native heart without angina pectoris [I25.10 (ICD-10-CM)]; Essential hypertension [I10 (ICD-10-CM)]; Patent foramen ovale [Q21.12 (ICD-10-CM)]; Mixed hyperlipidemia [  E78.2 (ICD-10-CM)]  History:        Patient has prior history of Echocardiogram examinations, most recent 04/30/2020. CAD; Risk Factors:Dyslipidemia and Hypertension.  Sonographer:    Charlie Jointer RDCS Referring Phys: [id number] ROBERT J KRASOWSKI  IMPRESSIONS   1. Focal hypertrophy of the basilar septum 18 mm. Left ventricular ejection fraction, by estimation, is 60 to 65%. The left ventricle has normal function. The left ventricle has no regional wall motion abnormalities. Left ventricular diastolic parameters are consistent with Grade I diastolic dysfunction (impaired relaxation). The average left ventricular global longitudinal strain is -18.6 %. The global longitudinal strain is normal. 2. Right ventricular systolic function is normal. The right ventricular size is normal. Tricuspid regurgitation signal is inadequate for assessing PA pressure. 3. The mitral valve is normal in structure. No evidence of mitral valve regurgitation. No evidence of mitral stenosis. 4. The aortic valve is tricuspid. Aortic  valve regurgitation is not visualized. No aortic stenosis is present. 5. The inferior vena cava is normal in size with greater than 50% respiratory variability, suggesting right atrial pressure of 3 mmHg.  FINDINGS Left Ventricle: Focal hypertrophy of the basilar septum 18 mm. Left ventricular ejection fraction, by estimation, is 60 to 65%. The left ventricle has normal function. The left ventricle has no regional wall motion abnormalities. The average left ventricular global longitudinal strain is -18.6 %. The global longitudinal strain is normal. The left ventricular internal cavity size was normal in size. There is no left ventricular hypertrophy. Left ventricular diastolic parameters are consistent with Grade I diastolic dysfunction (impaired relaxation). Indeterminate filling pressures.  Right Ventricle: The right ventricular size is normal. No increase in right ventricular wall thickness. Right ventricular systolic function is normal. Tricuspid regurgitation signal is inadequate for assessing PA pressure. The tricuspid regurgitant velocity is 2.58 m/s, and with an assumed right atrial pressure of 3 mmHg, the estimated right ventricular systolic pressure is 29.6 mmHg.  Left Atrium: Left atrial size was normal in size.  Right Atrium: Right atrial size was normal in size.  Pericardium: There is no evidence of pericardial effusion.  Mitral Valve: The mitral valve is normal in structure. No evidence of mitral valve regurgitation. No evidence of mitral valve stenosis.  Tricuspid Valve: The tricuspid valve is normal in structure. Tricuspid valve regurgitation is trivial. No evidence of tricuspid stenosis.  Aortic Valve: The aortic valve is tricuspid. Aortic valve regurgitation is not visualized. No aortic stenosis is present.  Pulmonic Valve: The pulmonic valve was normal in structure. Pulmonic valve regurgitation is not visualized. No evidence of pulmonic stenosis.  Aorta: The aortic root  and ascending aorta are structurally normal, with no evidence of dilitation and the aortic arch was not well visualized.  Venous: The pulmonary veins were not well visualized. The inferior vena cava is normal in size with greater than 50% respiratory variability, suggesting right atrial pressure of 3 mmHg.  IAS/Shunts: No atrial level shunt detected by color flow Doppler.   LEFT VENTRICLE PLAX 2D LVIDd:         3.90 cm Diastology LVIDs:         2.20 cm LV e' medial:  5.55 cm/s LV PW:         1.10 cm LV e' lateral: 6.96 cm/s LV IVS:        0.90 cm 2D Longitudinal Strain 2D Strain GLS Avg:     -18.6 %  RIGHT VENTRICLE             IVC RV  Basal diam:  1.80 cm     IVC diam: 1.50 cm RV S prime:     10.90 cm/s TAPSE (M-mode): 2.0 cm  LEFT ATRIUM             Index        RIGHT ATRIUM           Index LA diam:        3.30 cm 1.87 cm/m   RA Area:     12.00 cm LA Vol (A2C):   25.2 ml 14.24 ml/m  RA Volume:   22.70 ml  12.83 ml/m LA Vol (A4C):   24.2 ml 13.68 ml/m LA Biplane Vol: 26.9 ml 15.20 ml/m AORTIC VALVE LVOT Vmax:   132.00 cm/s LVOT Vmean:  81.900 cm/s LVOT VTI:    0.239 m  AORTA Ao Root diam: 3.30 cm Ao Asc diam:  3.40 cm  TRICUSPID VALVE TR Peak grad:   26.6 mmHg TR Vmax:        258.00 cm/s  SHUNTS Systemic VTI: 0.24 m  Redell Leiter MD Electronically signed by Redell Leiter MD Signature Date/Time: 11/04/2021/5:30:18 PM    Final    MONITORS  LONG TERM MONITOR (3-14 DAYS) 09/04/2020  Narrative The patient wore the monitor for 14 days starting August 07, 2020. Indication: Palpitations  The minimum heart rate was 39 bpm, maximum heart rate was 167 bpm, and average heart rate was 77 bpm. Predominant underlying rhythm was Sinus Rhythm. Second Degree AV Block-Mobitz I (Wenckebach) was present ( August 20, 2020 at 11:23 PM).  39 Supraventricular Tachycardia runs occurred, the run with the fastest interval lasting 8 beats with a maximum rate of 167 bpm, the  longest lasting 12 beats with an average rate of 132 bpm.  Premature atrial complexes were rare. Premature Ventricular complexes were rare.  No ventricular tachycardia, no pauses, No AV block and no atrial fibrillation present.  25 patient triggered events 10 associated with premature ventricular complex, 6 associated with premature atrial complex remaining associated with sinus rhythm. 10 diary events noted associated with premature atrial complex in sinus rhythm.   Conclusion: This study is remarkable for paroxysmal atrial tachycardia with variable block, and second degree AV block Mobitz type I which occur during the overnight hours.   CT SCANS  CT CORONARY MORPH W/CTA COR W/SCORE 06/19/2020  Addendum 06/19/2020 10:13 PM ADDENDUM REPORT: 06/19/2020 22:11  CLINICAL DATA:  83 year old female with chest pain. Medical history includes hypertension, and hyperlipidemia.  EXAM: Cardiac/Coronary  CT  TECHNIQUE: The patient was scanned on a [employer].  FINDINGS: A 120 kV prospective scan was triggered in the descending thoracic aorta at 111 HU's. Axial non-contrast 3 mm slices were carried out through the heart. The data set was analyzed on a dedicated work station and scored using the Agatson method. Gantry rotation speed was 250 msecs and collimation was .6 mm. No beta blockade and 0.8 mg of sl NTG was given. The 3D data set was reconstructed in 5% intervals of the 67-82 % of the R-R cycle. Diastolic phases were analyzed on a dedicated work station using MPR, MIP and VRT modes. The patient received 80 cc of contrast.  Aorta: Normal size.  No calcifications.  No dissection.  Aortic Valve:  Trileaflet.  No calcifications.  Coronary Arteries:  Normal coronary origin.  Right dominance.  RCA is a large dominant artery that gives rise to PDA and PLVB. There is no plaque.  Left main is a large  artery that gives rise to LAD and LCX arteries.  LAD is a large vessel.  There is a minimal (<24%) calcified plaque in the proximal portion of the LAD. The mid and distal portion with no plaque. D1 on with a minimal soft plaque in the proximal portion of the vessel. D2 with no plaque.  LCX is a non-dominant artery that gives rise to one large OM1 branch. There is no plaque.  Other findings:  Normal pulmonary vein drainage into the left atrium.  Normal left atrial appendage without a thrombus.  Normal size of the pulmonary artery.  IMPRESSION: 1. Coronary calcium score of 12. This was 23 percentile for age and sex matched control.  2. Normal coronary origin with right dominance.  3. Minimal Coronary artery disease. CADRAD 1. Recommend medical therapy.  Kardie Tobb, DO   Electronically Signed By: Kardie  Tobb DO On: 06/19/2020 22:11  Narrative EXAM: OVER-READ INTERPRETATION  CT CHEST  The following report is an over-read performed by radiologist Dr. Toribio Aye of [facility] on 06/19/2020. This over-read does not include interpretation of cardiac or coronary anatomy or pathology. The coronary calcium score/coronary CTA interpretation by the cardiologist is attached.  COMPARISON:  None.  FINDINGS: Aortic atherosclerosis. Within the visualized portions of the thorax there are no suspicious appearing pulmonary nodules or masses, there is no acute consolidative airspace disease, no pleural effusions, no pneumothorax and no lymphadenopathy. Visualized portions of the upper abdomen demonstrates diffuse low attenuation throughout the visualized hepatic parenchyma, indicative of hepatic steatosis. There are no aggressive appearing lytic or blastic lesions noted in the visualized portions of the skeleton.  IMPRESSION: 1.  Aortic Atherosclerosis (ICD10-I70.0). 2. Hepatic steatosis.  Electronically Signed: By: Toribio Aye M.D. On: 06/19/2020 13:23      ______________________________________________________________________________________________      EKG Interpretation Date/Time:  Thursday July 26 2024 15:04:33 EDT Ventricular Rate:  71 PR Interval:  154 QRS Duration:  78 QT Interval:  414 QTC Calculation: 449 R Axis:   -42  Text Interpretation: Normal sinus rhythm Left axis deviation No previous ECGs available Confirmed by Monetta Rogue ([id number]) on 07/26/2024 3:08:55 PM   Recent Labs: 05/18/2024: ALT 22; BUN 15; Creatinine, Ser 0.60; Hemoglobin 14.2; Platelets 226; Potassium 4.7; Sodium 140; TSH 0.262  Recent Lipid Panel    Component Value Date/Time   CHOL 165 05/18/2024 1053   TRIG 125 05/18/2024 1053   HDL 50 05/18/2024 1053   CHOLHDL 3.3 05/18/2024 1053   LDLCALC 93 05/18/2024 1053    Physical Exam:    VS:  BP 118/80   Pulse 71   Ht 4' 11 (1.499 m)   Wt 171 lb 6.4 oz (77.7 kg)   SpO2 97%   BMI 34.62 kg/m     Wt Readings from Last 3 Encounters:  07/26/24 171 lb 6.4 oz (77.7 kg)  05/17/24 171 lb 3.2 oz (77.7 kg)  03/16/24 165 lb 3.2 oz (74.9 kg)     GEN:  Well nourished, well developed in no acute distress HEENT: Normal NECK: No JVD; No carotid bruits LYMPHATICS: No lymphadenopathy CARDIAC: RRR, no murmurs, rubs, gallops RESPIRATORY:  Clear to auscultation without rales, wheezing or rhonchi  ABDOMEN: Soft, non-tender, non-distended MUSCULOSKELETAL:  No edema; No deformity  SKIN: Warm and dry NEUROLOGIC:  Alert and oriented x 3 PSYCHIATRIC:  Normal affect    Signed, Rogue Monetta, MD  07/26/2024 4:46 PM    [facility]

## 2024-07-26 ENCOUNTER — Encounter: Payer: Self-pay | Admitting: Cardiology

## 2024-07-26 ENCOUNTER — Ambulatory Visit: Attending: Cardiology | Admitting: Cardiology

## 2024-07-26 VITALS — BP 118/80 | HR 71 | Ht 59.0 in | Wt 171.4 lb

## 2024-07-26 DIAGNOSIS — I10 Essential (primary) hypertension: Secondary | ICD-10-CM

## 2024-07-26 DIAGNOSIS — I4719 Other supraventricular tachycardia: Secondary | ICD-10-CM | POA: Diagnosis not present

## 2024-07-26 DIAGNOSIS — I2510 Atherosclerotic heart disease of native coronary artery without angina pectoris: Secondary | ICD-10-CM

## 2024-07-26 NOTE — Patient Instructions (Signed)

## 2024-08-02 ENCOUNTER — Other Ambulatory Visit: Payer: Self-pay

## 2024-08-07 DIAGNOSIS — M5416 Radiculopathy, lumbar region: Secondary | ICD-10-CM | POA: Diagnosis not present

## 2024-08-09 ENCOUNTER — Other Ambulatory Visit (HOSPITAL_COMMUNITY): Payer: Self-pay

## 2024-08-09 ENCOUNTER — Other Ambulatory Visit: Payer: Self-pay | Admitting: Internal Medicine

## 2024-08-09 ENCOUNTER — Other Ambulatory Visit: Payer: Self-pay

## 2024-08-09 NOTE — Progress Notes (Signed)
 Specialty Pharmacy Refill Coordination Note  Tina Perkins is a 83 y.o. female assessed today regarding refills of clinic administered specialty medication(s) Omalizumab  (XOLAIR )   Clinic requested Courier to Provider Office   Delivery date: 08/14/24   Verified address: [facility]   Medication will be filled on: 08/13/24

## 2024-08-13 ENCOUNTER — Other Ambulatory Visit: Payer: Self-pay

## 2024-08-13 MED ORDER — XOLAIR 300 MG/2ML ~~LOC~~ SOSY
300.0000 mg | PREFILLED_SYRINGE | SUBCUTANEOUS | 11 refills | Status: DC
  Filled 2024-08-13: qty 2, 28d supply, fill #0
  Filled 2024-09-03 – 2024-09-04 (×2): qty 2, 28d supply, fill #1
  Filled 2024-09-26 – 2024-10-10 (×2): qty 2, 28d supply, fill #2

## 2024-08-15 ENCOUNTER — Other Ambulatory Visit: Payer: Self-pay | Admitting: Physician Assistant

## 2024-08-16 ENCOUNTER — Ambulatory Visit: Admitting: *Deleted

## 2024-08-16 DIAGNOSIS — L501 Idiopathic urticaria: Secondary | ICD-10-CM | POA: Diagnosis not present

## 2024-08-17 ENCOUNTER — Ambulatory Visit

## 2024-08-17 VITALS — BP 118/72 | HR 79 | Temp 98.0°F | Ht 59.0 in | Wt 174.2 lb

## 2024-08-17 DIAGNOSIS — E785 Hyperlipidemia, unspecified: Secondary | ICD-10-CM | POA: Diagnosis not present

## 2024-08-17 DIAGNOSIS — E1169 Type 2 diabetes mellitus with other specified complication: Secondary | ICD-10-CM | POA: Diagnosis not present

## 2024-08-17 DIAGNOSIS — E1165 Type 2 diabetes mellitus with hyperglycemia: Secondary | ICD-10-CM | POA: Diagnosis not present

## 2024-08-17 DIAGNOSIS — E1159 Type 2 diabetes mellitus with other circulatory complications: Secondary | ICD-10-CM | POA: Diagnosis not present

## 2024-08-17 DIAGNOSIS — L501 Idiopathic urticaria: Secondary | ICD-10-CM

## 2024-08-17 DIAGNOSIS — E559 Vitamin D deficiency, unspecified: Secondary | ICD-10-CM

## 2024-08-17 DIAGNOSIS — I152 Hypertension secondary to endocrine disorders: Secondary | ICD-10-CM

## 2024-08-17 DIAGNOSIS — R899 Unspecified abnormal finding in specimens from other organs, systems and tissues: Secondary | ICD-10-CM

## 2024-08-17 DIAGNOSIS — M48062 Spinal stenosis, lumbar region with neurogenic claudication: Secondary | ICD-10-CM | POA: Diagnosis not present

## 2024-08-17 DIAGNOSIS — L509 Urticaria, unspecified: Secondary | ICD-10-CM

## 2024-08-17 NOTE — Assessment & Plan Note (Addendum)
 Abnormal thyroid  function tests, under monitoring Abnormal thyroid  function tests with low thyroid  levels, likely due to metabolic stress. No symptoms of hyperthyroidism present. - Will repeat thyroid  function tests in 1-2 months to monitor levels. Orders:   Thyroid  Panel With TSH; Future

## 2024-08-17 NOTE — Patient Instructions (Signed)
  VISIT SUMMARY: Today, we discussed your ongoing back pain, leg cramps, and other health concerns. We reviewed your current medications and made some recommendations to help manage your symptoms.  YOUR PLAN: SCOLIOSIS WITH CHRONIC LOW BACK PAIN AND LUMBAR RADICULOPATHY: You have severe scoliosis causing chronic low back pain and nerve pain that radiates to your legs and toes. -Continue taking gabapentin 300 mg at bedtime for nerve pain. -Do daily stretching and exercises to improve core strength. -Use tramadol as needed for pain, but be cautious of potential grogginess and constipation.  CHRONIC IDIOPATHIC URTICARIA: You experience episodes of hives and swelling, which are managed with your current medications. -Continue with your monthly Xolair  injections. -Continue taking Singulair  as prescribed.  TYPE 2 DIABETES MELLITUS, WELL CONTROLLED: Your diabetes is well controlled with an A1c of 5.5. -Keep monitoring your blood glucose levels regularly.  ABNORMAL THYROID  FUNCTION TESTS, UNDER MONITORING: Your thyroid  function tests showed low thyroid  levels, but you do not have symptoms of hyperthyroidism. -We will repeat your thyroid  function tests in 1-2 months to monitor your levels.  GENERAL HEALTH MAINTENANCE: We discussed your overall health, including diet and exercise. -Maintain a healthy diet with plenty of fruits and vegetables. -Continue regular exercise and stretching to help with mobility and pain management.                      Contains text generated by [employer].                                 Contains text generated by [employer].

## 2024-08-17 NOTE — Assessment & Plan Note (Signed)
  Orders:   CBC with Differential/Platelet; Future   Comprehensive metabolic panel with GFR; Future   Thyroid  Panel With TSH; Future

## 2024-08-17 NOTE — Progress Notes (Signed)
 Subjective:  Patient ID: Tina Perkins, female    DOB: 20-Dec-1940  Age: 83 y.o. MRN: [id number]  Chief Complaint  Patient presents with   Medical Management of Chronic Issues    HPI: Discussed the use of AI scribe software for clinical note transcription with the patient, who gave verbal consent to proceed.   History of Present Illness   Tina Perkins is an 83 year old female accompanied by her daughter, here for follow up. She has been a patient of this office, but is switching to me as her PCP. She has scoliosis, back pain and leg cramps.  Back pain and radiculopathy - Severe back pain attributed to scoliosis and pinched nerves in the spine - Pain has persisted for years without relief despite multiple treatments, including four back injections - Pain radiates from lower back down to legs and toes - Significantly impacts mobility and quality of life - Referred to pain management and has tried various medications without success - Current medications for pain and nerve symptoms include gabapentin 300 mg at bedtime and tramadol as needed  Leg cramps and neuromuscular symptoms - Frequent leg cramps, particularly in the left leg - Severity of cramps prevents weight bearing on the left foot - Drinks plenty of water to help manage symptoms  Cutaneous and allergic manifestations - Diagnosed with IDIOPATHIC URTICARIA; genetic testing negative - Symptoms include idiopathic urticaria, rashes, hives, and swelling in legs and arms - Receives monthly Xolair  injections for symptom management - Uses Singulair  for hives  Cardiovascular symptoms and blood pressure management - History of low blood pressure - Previously on two antihypertensive medications, discontinued due to dizziness and near-syncope - Currently not taking blood pressure medications - Uses meclizine  as needed for dizziness - Manages heart condition with daily baby aspirin  Glycemic control - History of diabetes - A1c  of 5.5, indicating good glycemic control - Elevated blood sugar during prior hospitalization for COVID-19, attributed to medication received at that time  Thyroid  function abnormality - Thyroid  function slightly abnormal in August WITH LOW TSH AND NORMAL T4 - Not currently taking thyroid  medication             08/17/2024   11:06 AM 05/17/2024   10:32 AM 01/30/2024   10:13 AM 05/31/2023   11:21 AM 05/12/2023    9:47 AM  Depression screen PHQ 2/9  Decreased Interest 0 0 2 2 0  Down, Depressed, Hopeless 0 0 3 1 0  PHQ - 2 Score 0 0 5 3 0  Altered sleeping   3 3   Tired, decreased energy   3 3   Change in appetite   0 2   Feeling bad or failure about yourself    0 0   Trouble concentrating   3 0   Moving slowly or fidgety/restless   0 0   Suicidal thoughts   0 0   PHQ-9 Score   14  11    Difficult doing work/chores   Not difficult at all Not difficult at all      Data saved with a previous flowsheet row definition        08/17/2024   11:06 AM  Fall Risk   Falls in the past year? 0  Number falls in past yr: 0  Injury with Fall? 0  Risk for fall due to : No Fall Risks  Follow up Falls evaluation completed    Patient Care Team: Elison Worrel, MD as PCP - General (Family Medicine)  Review of Systems  Constitutional:  Negative for chills, fatigue and fever.  HENT:  Negative for congestion, ear pain, sinus pressure and sore throat.   Respiratory:  Negative for cough and shortness of breath.   Cardiovascular:  Negative for chest pain.  Gastrointestinal:  Negative for abdominal pain, constipation, diarrhea, nausea and vomiting.  Genitourinary:  Negative for dysuria and frequency.  Musculoskeletal:  Positive for arthralgias and back pain. Negative for myalgias.  Skin:  Positive for color change and rash.  Neurological:  Negative for dizziness and headaches.  Psychiatric/Behavioral:  Negative for dysphoric mood. The patient is not nervous/anxious.     Current Outpatient  Medications on File Prior to Visit  Medication Sig Dispense Refill   aspirin EC 81 MG tablet Take 81 mg by mouth daily. Swallow whole.     EPINEPHrine  0.3 mg/0.3 mL IJ SOAJ injection Inject 0.3 mg into the muscle as needed for anaphylaxis. 1 each 1   famotidine  (PEPCID ) 20 MG tablet Take 1 tablet (20 mg total) by mouth 2 (two) times daily. 60 tablet 5   fexofenadine  (ALLEGRA ) 180 MG tablet Take 1 tablet (180 mg total) by mouth daily. 90 tablet 5   gabapentin (NEURONTIN) 300 MG capsule Take 300 mg by mouth at bedtime.     meclizine  (ANTIVERT ) 12.5 MG tablet TAKE 1 TABLET BY MOUTH THREE TIMES DAILY AS NEEDED FOR DIZZINESS 30 tablet 5   montelukast  (SINGULAIR ) 10 MG tablet Take 1 tablet (10 mg total) by mouth at bedtime. 90 tablet 1   nitroGLYCERIN  (NITROSTAT ) 0.4 MG SL tablet Place 1 tablet (0.4 mg total) under the tongue every 5 (five) minutes as needed for chest pain. 90 tablet 3   omalizumab  (XOLAIR ) 300 MG/2  ML prefilled syringe Inject 300 mg into the skin every 28 (twenty-eight) days. 2 mL 11   ondansetron  (ZOFRAN ) 4 MG tablet Take 1 tablet (4 mg total) by mouth every 8 (eight) hours as needed for nausea or vomiting. 20 tablet 0   traMADol (ULTRAM) 50 MG tablet Take 50 mg by mouth at bedtime.     Vitamin D , Ergocalciferol , (DRISDOL ) 1.25 MG (50000 UNIT) CAPS capsule Take 50,000 Units by mouth 2 (two) times a week.     blood glucose meter kit and supplies KIT Dispense based on patient and insurance preference. Use up to four times daily as directed. (Patient not taking: Reported on 08/17/2024) 1 each 0   Blood Glucose Monitoring Suppl (ONETOUCH VERIO FLEX SYSTEM) w/Device KIT Check Glucose fasting and 2 hours after meals (Patient not taking: Reported on 08/17/2024) 1 kit 0   glucose blood (ONETOUCH VERIO) test strip Check glucose bid (Patient not taking: Reported on 08/17/2024) 300 each 3   Lancets (ONETOUCH DELICA PLUS LANCET33G) MISC USE TO TEST BLOOD SUGAR 4 TIMES DAILY AS DIRECTED (Patient  not taking: Reported on 08/17/2024) 100 each 10   Current Facility-Administered Medications on File Prior to Visit  Medication Dose Route Frequency Provider Last Rate Last Admin   omalizumab  (XOLAIR ) prefilled syringe 600 mg  600 mg Subcutaneous Q28 days Lorin Norris, MD   600 mg at 08/16/24 1436   Past Medical History:  Diagnosis Date   Acute respiratory disease due to COVID-19 virus 08/12/2019   BMI 35.0-35.9,adult 03/31/2020   Chest pain, precordial 04/10/2020   Chronic bilateral low back pain with bilateral sciatica 09/15/2021   Last Assessment & Plan:  Formatting of this note might be different from the original. 83 year old female seen today in initial consultation for  her chronic back pain with bilateral lower extremity radiculopathy, left greater than right.    Will request previous lumbar spine MRI for review.  At this time, I will start patient on gabapentin 100 mg q.h.s. x2 weeks then increase her to 200 mg q.h.s.    Coronary artery disease involving native coronary artery of native heart without angina pectoris 05/10/2016   Apparently coronary intervention done in 1993 in [place]  She was fine to have 75% narrowing of the mid LAD, nitroglycerin  was given reduction to 30-40% it was also described as bridging in that area   CVA (cerebral vascular accident) (HCC)    2020   Daytime somnolence 09/15/2020   Essential hypertension 04/10/2020   History of hepatitis 12/14/2019   Hypovitaminosis D 10/29/2021   Mixed hyperlipidemia 12/14/2019   Neck pain 09/15/2021   Last Assessment & Plan:  Formatting of this note might be different from the original. Atraumatic, acute onset cervical spine pain and stiffness x 2-3 weeks which has been progressive without improvement.  No associated fevers or chills.  Associated headaches, left shoulder, and left upper extremity pain that radiates down into her hand and fingers. This pain is most likely radicular in nature.      Obesity (BMI 30-39.9)  04/10/2020   Pain medication agreement 09/15/2021   Last Assessment & Plan:  Formatting of this note might be different from the original. Agreement signed and placed in the patient's chart today..  UDS to be collected in compliance with clinic policies and procedures.   Patient did not display any signs of abuse or diversion and has been checked on the [place]  Controlled Substance website.   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 Added automatically from request for surgery [id number] Formatting of this note might be different from the original. Added automatically from request for surgery [id number]   Past Surgical History:  Procedure Laterality Date   CESAREAN SECTION N/A    COLON SURGERY     TOTAL KNEE ARTHROPLASTY Bilateral    2017 and 2019    Family History  Problem Relation Age of Onset   Cancer Mother    Heart attack Mother    Heart attack Father    Cancer Father    Cancer Sister    Cancer Brother    Cancer Daughter    Social History   Socioeconomic History   Marital status: Single    Spouse name: Not on file   Number of children: 11   Years of education: 9   Highest education level: 9th grade  Occupational History   Occupation: Retired  Tobacco Use   Smoking status: Never    Passive exposure: Never   Smokeless tobacco: Never  Vaping Use   Vaping status: Never Used  Substance and Sexual Activity   Alcohol use: Never   Drug use: Never   Sexual activity: Not Currently  Other Topics Concern   Not on file  Social History Narrative   Not on file    Social Drivers of Health   Financial Resource Strain: Low Risk  (05/17/2024)  Overall Financial Resource Strain (CARDIA)    Difficulty of Paying Living Expenses: Not hard at all  Food Insecurity: No Food Insecurity (05/17/2024)   Hunger Vital Sign    Worried About Running Out of Food in the Last Year: Never true    Ran Out of Food in the Last Year: Never true  Transportation Needs: No Transportation Needs (05/17/2024)   PRAPARE - Administrator, Civil Service (Medical): No    Lack of Transportation (Non-Medical): No  Physical Activity: Inactive (05/17/2024)   Exercise Vital Sign    Days of Exercise per Week: 0 days    Minutes of Exercise per Session: 0 min  Stress: No Stress Concern Present (05/17/2024)   [employer] of Occupational Health - Occupational Stress Questionnaire    Feeling of Stress: Not at all  Social Connections: Socially Isolated (05/17/2024)   Social Connection and Isolation Panel    Frequency of Communication with Friends and Family: More than three times a week    Frequency of Social Gatherings with Friends and Family: More than three times a week    Attends Religious Services: Never    Database Administrator or Organizations: No    Attends Banker Meetings: Never    Marital Status: Widowed    Objective:  BP 118/72   Pulse 79   Temp 98 F (36.7 C)   Ht 4' 11 (1.499 m)   Wt 174 lb 3.2 oz (79 kg)   SpO2 96%   BMI 35.18 kg/m      08/17/2024   11:02 AM 07/26/2024    3:02 PM 05/25/2024   11:01 AM  BP/Weight  Systolic BP 118 118 126  Diastolic BP 72 80 72  Wt. (Lbs) 174.2 171.4   BMI 35.18 kg/m2 34.62 kg/m2     Physical Exam Vitals and nursing note reviewed.  Constitutional:      Appearance: She is obese.  HENT:     Head: Normocephalic and atraumatic.  Eyes:     Pupils: Pupils are equal, round, and reactive to light.  Cardiovascular:     Rate and Rhythm: Normal rate and regular rhythm.  Pulmonary:     Effort:  Pulmonary effort is normal.     Breath sounds: Normal breath sounds.  Musculoskeletal:     Cervical back: Normal range of motion.  Skin:    Findings: Lesion (very SK lesions noted on the skin. no hives at this time) present.  Neurological:     Mental Status: She is alert.         Lab Results  Component Value Date   WBC 9.6 05/18/2024   HGB 14.2 05/18/2024   HCT 44.3 05/18/2024   PLT 226 05/18/2024   GLUCOSE 134 (H) 05/18/2024   CHOL 165 05/18/2024   TRIG 125 05/18/2024   HDL 50 05/18/2024   LDLCALC 93 05/18/2024   ALT 22 05/18/2024   AST 22 05/18/2024   NA 140 05/18/2024   K 4.7 05/18/2024   CL 107 (H) 05/18/2024   CREATININE 0.60 05/18/2024   BUN 15 05/18/2024   CO2 16 (L) 05/18/2024   TSH 0.262 (L) 05/18/2024   HGBA1C 5.5 05/18/2024    Results for orders placed or performed in visit on 05/25/24  KIT (D816V) Digital PCR   Collection Time: 05/25/24  2:30 PM  Result Value Ref Range   CKIT Result Negative    CKIT Quant Value Comment %   Specimen Type Blood  Background Comment    Method/Extraction Comment    References Comment    Director Review Comment   .  Assessment & Plan:   Assessment & Plan Hypertension associated with diabetes (HCC)  Orders:   CBC with Differential/Platelet; Future   Comprehensive metabolic panel with GFR; Future   Thyroid  Panel With TSH; Future   Lipid panel; Future  Hives of unknown origin  Orders:   CBC with Differential/Platelet; Future   Comprehensive metabolic panel with GFR; Future   Thyroid  Panel With TSH; Future  Type 2 diabetes mellitus with hyperglycemia, without long-term current use of insulin  (HCC) Type 2 diabetes mellitus, well controlled without complication Type 2 diabetes mellitus is well controlled with an A1c of 5.5, improved from 7.2 in December 2023. - Continue monitoring blood glucose levels. Orders:   Comprehensive metabolic panel with GFR; Future   Hemoglobin A1c; Future  Hyperlipidemia  associated with type 2 diabetes mellitus (HCC)  Orders:   Comprehensive metabolic panel with GFR; Future   Lipid panel; Future  Abnormal laboratory test Abnormal thyroid  function tests, under monitoring Abnormal thyroid  function tests with low thyroid  levels, likely due to metabolic stress. No symptoms of hyperthyroidism present. - Will repeat thyroid  function tests in 1-2 months to monitor levels. Orders:   Thyroid  Panel With TSH; Future  Hypovitaminosis D Continue vitamin D  supplement     Spinal stenosis, lumbar region with neurogenic claudication Scoliosis with chronic low back pain and lumbar radiculopathy Severe scoliosis with chronic low back pain and lumbar radiculopathy. Previous interventions, including injections and pain management, have been ineffective. Surgical intervention is not recommended due to age and potential risks. Pain radiates from the waist to the toes, likely due to pinched nerves from arthritis in the lumbar spine. - Continue gabapentin 300 mg at bedtime for nerve pain. - Encouraged daily stretching and exercises to improve core strength. - Use tramadol as needed for pain management, cautioning about potential grogginess and constipation. - continue to follow up with pain management.     Chronic idiopathic urticaria Chronic idiopathic urticaria Episodes of hives and swelling, managed with Xolair  and Singulair . Genetic testing for systemic mastocytosis was negative. - Continue Xolair  injections monthly. - Continue Singulair  as prescribed.     General Health Maintenance General health maintenance discussed, including diet and exercise. No significant issues with vision or mobility. Lives with son but requires assistance with shopping and transportation. - Encouraged healthy diet with fruits and vegetables. - Encouraged regular exercise and stretching to maintain mobility and manage pain.       OVERALL, PATIENT DOING WELL WITH HER HEALTH, EXCEPT BEING  IN PAIN FROM THE SCOLIOSIS. SHE MENTIONED SOMETHING ABOUT A RECENT CANCER DIAGNOSIS BUT I AM UNSURE OF THIS. SHE TESTED NEGATIVE FOR SYSTEMIC MASTOCYTOSIS.  ADVISED TO DISCUSS WITH HER ALLERGY SPECIALIST.  Body mass index is 35.18 kg/m.    No orders of the defined types were placed in this encounter.   No orders of the defined types were placed in this encounter.      Follow-up: No follow-ups on file.  An After Visit Summary was printed and given to the patient.  Phil Michels, MD [facility] [phone]

## 2024-08-17 NOTE — Assessment & Plan Note (Addendum)
 Continue vitamin D supplement

## 2024-08-17 NOTE — Assessment & Plan Note (Addendum)
 Type 2 diabetes mellitus, well controlled without complication Type 2 diabetes mellitus is well controlled with an A1c of 5.5, improved from 7.2 in December 2023. - Continue monitoring blood glucose levels. Orders:   Comprehensive metabolic panel with GFR; Future   Hemoglobin A1c; Future

## 2024-08-17 NOTE — Assessment & Plan Note (Signed)
  Orders:   CBC with Differential/Platelet; Future   Comprehensive metabolic panel with GFR; Future   Thyroid  Panel With TSH; Future   Lipid panel; Future

## 2024-08-17 NOTE — Assessment & Plan Note (Signed)
  Orders:   Comprehensive metabolic panel with GFR; Future   Lipid panel; Future

## 2024-08-18 LAB — COMPREHENSIVE METABOLIC PANEL WITH GFR
ALT: 19 IU/L (ref 0–32)
AST: 20 IU/L (ref 0–40)
Albumin: 4.1 g/dL (ref 3.7–4.7)
Alkaline Phosphatase: 56 IU/L (ref 48–129)
BUN/Creatinine Ratio: 22 (ref 12–28)
BUN: 17 mg/dL (ref 8–27)
Bilirubin Total: 0.5 mg/dL (ref 0.0–1.2)
CO2: 23 mmol/L (ref 20–29)
Calcium: 9.6 mg/dL (ref 8.7–10.3)
Chloride: 103 mmol/L (ref 96–106)
Creatinine, Ser: 0.79 mg/dL (ref 0.57–1.00)
Globulin, Total: 2.2 g/dL (ref 1.5–4.5)
Glucose: 133 mg/dL — ABNORMAL HIGH (ref 70–99)
Potassium: 4 mmol/L (ref 3.5–5.2)
Sodium: 139 mmol/L (ref 134–144)
Total Protein: 6.3 g/dL (ref 6.0–8.5)
eGFR: 74 mL/min/1.73 (ref >59)

## 2024-08-18 LAB — CBC WITH DIFFERENTIAL/PLATELET
Basophils Absolute: 0.1 x10E3/uL (ref 0.0–0.2)
Basos: 1 %
EOS (ABSOLUTE): 0.1 x10E3/uL (ref 0.0–0.4)
Eos: 2 %
Hematocrit: 43.9 % (ref 34.0–46.6)
Hemoglobin: 14.2 g/dL (ref 11.1–15.9)
Immature Grans (Abs): 0 x10E3/uL (ref 0.0–0.1)
Immature Granulocytes: 0 %
Lymphocytes Absolute: 1.8 x10E3/uL (ref 0.7–3.1)
Lymphs: 24 %
MCH: 29.5 pg (ref 26.6–33.0)
MCHC: 32.3 g/dL (ref 31.5–35.7)
MCV: 91 fL (ref 79–97)
Monocytes Absolute: 0.6 x10E3/uL (ref 0.1–0.9)
Monocytes: 9 %
Neutrophils Absolute: 4.8 x10E3/uL (ref 1.4–7.0)
Neutrophils: 64 %
Platelets: 250 x10E3/uL (ref 150–450)
RBC: 4.82 x10E6/uL (ref 3.77–5.28)
RDW: 11.6 % — ABNORMAL LOW (ref 11.7–15.4)
WBC: 7.5 x10E3/uL (ref 3.4–10.8)

## 2024-08-18 LAB — THYROID PANEL WITH TSH
Free Thyroxine Index: 2.1 (ref 1.2–4.9)
T3 Uptake Ratio: 25 % (ref 24–39)
T4, Total: 8.5 ug/dL (ref 4.5–12.0)
TSH: 0.402 u[IU]/mL — ABNORMAL LOW (ref 0.450–4.500)

## 2024-08-18 LAB — LIPID PANEL
Chol/HDL Ratio: 3.9 ratio (ref 0.0–4.4)
Cholesterol, Total: 179 mg/dL (ref 100–199)
HDL: 46 mg/dL (ref >39)
LDL Chol Calc (NIH): 103 mg/dL — ABNORMAL HIGH (ref 0–99)
Triglycerides: 174 mg/dL — ABNORMAL HIGH (ref 0–149)
VLDL Cholesterol Cal: 30 mg/dL (ref 5–40)

## 2024-08-18 LAB — HEMOGLOBIN A1C
Est. average glucose Bld gHb Est-mCnc: 117 mg/dL
Hgb A1c MFr Bld: 5.7 % — ABNORMAL HIGH (ref 4.8–5.6)

## 2024-08-18 NOTE — Assessment & Plan Note (Signed)
 Chronic idiopathic urticaria Episodes of hives and swelling, managed with Xolair  and Singulair . Genetic testing for systemic mastocytosis was negative. - Continue Xolair  injections monthly. - Continue Singulair  as prescribed.

## 2024-08-18 NOTE — Assessment & Plan Note (Signed)
 Scoliosis with chronic low back pain and lumbar radiculopathy Severe scoliosis with chronic low back pain and lumbar radiculopathy. Previous interventions, including injections and pain management, have been ineffective. Surgical intervention is not recommended due to age and potential risks. Pain radiates from the waist to the toes, likely due to pinched nerves from arthritis in the lumbar spine. - Continue gabapentin 300 mg at bedtime for nerve pain. - Encouraged daily stretching and exercises to improve core strength. - Use tramadol as needed for pain management, cautioning about potential grogginess and constipation. - continue to follow up with pain management.

## 2024-08-20 ENCOUNTER — Ambulatory Visit: Payer: Self-pay

## 2024-08-21 DIAGNOSIS — Z1231 Encounter for screening mammogram for malignant neoplasm of breast: Secondary | ICD-10-CM | POA: Diagnosis not present

## 2024-08-21 LAB — HM MAMMOGRAPHY

## 2024-08-22 ENCOUNTER — Encounter: Payer: Self-pay | Admitting: Physician Assistant

## 2024-08-24 ENCOUNTER — Ambulatory Visit (INDEPENDENT_AMBULATORY_CARE_PROVIDER_SITE_OTHER): Admitting: Internal Medicine

## 2024-08-24 ENCOUNTER — Encounter: Payer: Self-pay | Admitting: Internal Medicine

## 2024-08-24 VITALS — BP 120/76 | HR 62 | Resp 16

## 2024-08-24 DIAGNOSIS — I10 Essential (primary) hypertension: Secondary | ICD-10-CM

## 2024-08-24 DIAGNOSIS — L501 Idiopathic urticaria: Secondary | ICD-10-CM

## 2024-08-24 DIAGNOSIS — R748 Abnormal levels of other serum enzymes: Secondary | ICD-10-CM | POA: Diagnosis not present

## 2024-08-24 NOTE — Progress Notes (Signed)
   FOLLOW UP Date of Service/Encounter:  08/24/24  Subjective:  Tina Perkins (DOB: 1940-12-31) is a 83 y.o. female who [facility] in re-evaluation of the following: CSU/A, elevated tryptase History obtained from: chart review and patient and daughter.  For Review, LV was on 05/25/24  with Dr. Lorin seen for intial visit for urticaria and itch . See below for summary of history and diagnostics.   Today presents for follow-up. Discussed the use of AI scribe software for clinical note transcription with the patient, who gave verbal consent to proceed.  History of Present Illness [name] is an 83 year old female with chronic hives who presents for follow-up after starting Xolair  treatment.  Chronic urticaria - Chronic hives with recent flare-up prior to last Xolair  injection on August 16, 2024 - Flare-up involved hives under the arm and resolved with extra allegra   - Overall significant improvement in hives as this has been the only flare  - No recent illness, medication changes, or stressors contributing to flare-up  Allergy pharmacotherapy - Currently taking Allegra  twice daily, Pepcid , and Singulair  for allergy management - Received three Xolair  injections for chronic urticaria - No side effects from Xolair  therapy - No logistical issues obtaining Xolair   Laboratory and genetic evaluation - Tryptase levels elevated at 19 and 20 - Ckit testing for mastocytosis negative  All medications reviewed by clinical staff and updated in chart. No new pertinent medical or surgical history except as noted in HPI.  ROS: All others negative except as noted per HPI.   Objective:  BP 120/76   Pulse 62   Resp 16   SpO2 96%  There is no height or weight on file to calculate BMI. Physical Exam: General Appearance:  Alert, cooperative, no distress, appears stated age  Head:  Normocephalic, without obvious abnormality, atraumatic  Eyes:   Conjunctiva clear, EOM's intact  Ears EACs normal bilaterally  Nose: Nares normal, no rhinnorrhea   Throat: Lips, tongue normal; teeth and gums normal, MMM  Neck: Supple, symmetrical  Lungs:    , Respirations unlabored, no coughing  Heart:   , Appears well perfused  Extremities: No edema  Skin: Excoriation marks on arms  and Skin color, texture, turgor normal  Neurologic: No gross deficits   Labs:  Lab Orders  No laboratory test(s) ordered today       Assessment/Plan   Patient Instructions  Chronic Idiopathic Urticaria: improved  with elevated tryptase level   Therapy Plan:  - Continue allegra  (fexofenadine ) 180mg  twice daily  - Continue pepcid  20mg  twice dialy  - Continue singulair  10mg  daily  - Continue xolair  300mg  every 4 weeks,   Peripheral C-KIT mutation was negative for systemic mastocytosis   Can purchase fexofenadine  cheaply from [employer], [employer], [employer] or amazon.  Generic is fine.   Recommend avoiding any sedating antihistamines as this can be associated with dementia   Follow up: in 6 months   Other:    Thank you so much for letting me partake in your care today.  Don't hesitate to reach out if you have any additional concerns!  Hargis Lorin, MD  [facility] [place], [place]

## 2024-08-24 NOTE — Patient Instructions (Addendum)
 Chronic Idiopathic Urticaria: improved  with elevated tryptase level   Therapy Plan:  - Continue allegra  (fexofenadine ) 180mg  twice daily  - Continue pepcid  20mg  twice dialy  - Continue singulair  10mg  daily  - Continue xolair  300mg  every 4 weeks,   Peripheral C-KIT mutation was negative for systemic mastocytosis   Can purchase fexofenadine  cheaply from [employer], [employer], [employer] or amazon.  Generic is fine.   Recommend avoiding any sedating antihistamines as this can be associated with dementia   Follow up: in 6 months

## 2024-09-03 ENCOUNTER — Other Ambulatory Visit (HOSPITAL_COMMUNITY): Payer: Self-pay

## 2024-09-04 ENCOUNTER — Other Ambulatory Visit: Payer: Self-pay

## 2024-09-04 NOTE — Progress Notes (Signed)
 Specialty Pharmacy Refill Coordination Note  Tina Perkins is a 83 y.o. female contacted today regarding refills of specialty medication(s) Omalizumab  (Xolair )   Patient requested Courier to Provider Office   Delivery date: 09/10/24   Verified address: [facility]   Medication will be filled on: 09/07/24   Appointment: 12.11.25  Copay: $0.00

## 2024-09-06 ENCOUNTER — Other Ambulatory Visit: Payer: Self-pay

## 2024-09-07 ENCOUNTER — Other Ambulatory Visit: Payer: Self-pay

## 2024-09-13 ENCOUNTER — Ambulatory Visit

## 2024-09-13 DIAGNOSIS — L501 Idiopathic urticaria: Secondary | ICD-10-CM

## 2024-09-14 ENCOUNTER — Other Ambulatory Visit: Payer: Self-pay | Admitting: Physician Assistant

## 2024-09-20 ENCOUNTER — Ambulatory Visit: Admitting: Endocrinology

## 2024-09-24 NOTE — Progress Notes (Signed)
 Pharmacy Quality Measure Review  This patient is appearing on a report for being at risk of failing the adherence measure for diabetes medications this calendar year.   Medication: Rybelsus  7 mg Last fill date: 09/14/24 for 30 day supply  Insurance report was not up to date. No action needed at this time.   Jenkins Graces, PharmD PGY1 Pharmacy Resident

## 2024-09-25 ENCOUNTER — Other Ambulatory Visit: Payer: Self-pay | Admitting: Physician Assistant

## 2024-09-26 ENCOUNTER — Other Ambulatory Visit: Payer: Self-pay

## 2024-09-26 NOTE — Progress Notes (Signed)
 Specialty Pharmacy Refill Coordination Note  Tina Perkins is a 83 y.o. female assessed today regarding refills of clinic administered specialty medication(s) Omalizumab  (Xolair )   Clinic requested Courier to Provider Office   Delivery date: 10/08/24   Verified address: [facility]   Medication will be filled on: 10/05/24

## 2024-10-05 ENCOUNTER — Other Ambulatory Visit (HOSPITAL_COMMUNITY): Payer: Self-pay

## 2024-10-05 ENCOUNTER — Other Ambulatory Visit: Payer: Self-pay

## 2024-10-08 ENCOUNTER — Other Ambulatory Visit: Payer: Self-pay

## 2024-10-09 ENCOUNTER — Telehealth: Payer: Self-pay

## 2024-10-09 ENCOUNTER — Other Ambulatory Visit: Payer: Self-pay

## 2024-10-09 NOTE — Telephone Encounter (Signed)
 To look into possible PAP/other options for patient. Copay showing $1,200+

## 2024-10-10 ENCOUNTER — Telehealth: Payer: Self-pay | Admitting: *Deleted

## 2024-10-10 ENCOUNTER — Other Ambulatory Visit (HOSPITAL_COMMUNITY): Payer: Self-pay

## 2024-10-10 ENCOUNTER — Other Ambulatory Visit: Payer: Self-pay

## 2024-10-10 NOTE — Telephone Encounter (Signed)
 Messaged Tammy V for approval letter from insurance

## 2024-10-10 NOTE — Telephone Encounter (Signed)
 Per Tammy- office will be working on PAP. Nothing further needed on my end. Closing encounter.

## 2024-10-10 NOTE — Telephone Encounter (Signed)
 Sent Tina Perkins claim information via Teams for PAP

## 2024-10-10 NOTE — Telephone Encounter (Signed)
 L/M for patient to call back to discuss possible PAP application through [employer]. Can submit Patient Consent form online.

## 2024-10-10 NOTE — Telephone Encounter (Signed)
 Patient called me back, I explained to the patient that [facility] is going to continue the process for PAP application. Patient has appt tomorrow, I advised her to call the office and inquire about possible sample usage until PAP is denied or what other options they may have.

## 2024-10-10 NOTE — Telephone Encounter (Signed)
 Patient called in regards to her Xolair  received info from [place] patient copay now $1200 for Jan. She is going to come in fro sample tomorrow and will have her sign PAP from to try and get her Xolair  through [employer]

## 2024-10-11 ENCOUNTER — Ambulatory Visit: Admitting: *Deleted

## 2024-10-11 DIAGNOSIS — L501 Idiopathic urticaria: Secondary | ICD-10-CM

## 2024-10-12 ENCOUNTER — Ambulatory Visit

## 2024-10-12 VITALS — BP 114/88 | HR 66 | Temp 98.4°F | Ht 59.0 in | Wt 174.3 lb

## 2024-10-12 DIAGNOSIS — E66812 Obesity, class 2: Secondary | ICD-10-CM

## 2024-10-12 DIAGNOSIS — M5442 Lumbago with sciatica, left side: Secondary | ICD-10-CM | POA: Diagnosis not present

## 2024-10-12 DIAGNOSIS — L501 Idiopathic urticaria: Secondary | ICD-10-CM

## 2024-10-12 DIAGNOSIS — M5441 Lumbago with sciatica, right side: Secondary | ICD-10-CM

## 2024-10-12 DIAGNOSIS — E119 Type 2 diabetes mellitus without complications: Secondary | ICD-10-CM | POA: Diagnosis not present

## 2024-10-12 DIAGNOSIS — G8929 Other chronic pain: Secondary | ICD-10-CM

## 2024-10-12 DIAGNOSIS — Z6835 Body mass index (BMI) 35.0-35.9, adult: Secondary | ICD-10-CM | POA: Diagnosis not present

## 2024-10-12 DIAGNOSIS — E1169 Type 2 diabetes mellitus with other specified complication: Secondary | ICD-10-CM

## 2024-10-12 DIAGNOSIS — E785 Hyperlipidemia, unspecified: Secondary | ICD-10-CM | POA: Diagnosis not present

## 2024-10-12 MED ORDER — TRAMADOL HCL 50 MG PO TABS: 50.0000 mg | ORAL_TABLET | Freq: Every evening | ORAL | 0 refills | Status: AC | PRN

## 2024-10-12 NOTE — Assessment & Plan Note (Signed)
 Not on a statin inspite of diabetes diagnosis. Last LDL at 103. Good HDL. But given her age, not sure if she would need to be on a statin. Will consider if LDL rises.

## 2024-10-12 NOTE — Patient Instructions (Addendum)
" °  ° ° °  Contains text generated by Abridge.   VISIT SUMMARY: During your visit, we discussed your chronic back pain, idiopathic urticaria, and recent lab results. We reviewed your current medications and made some recommendations for managing your symptoms.  YOUR PLAN: CHRONIC BACK PAIN WITH LUMBAR SCOLIOSIS AND RADICULOPATHY: You have severe and persistent back pain that radiates to your legs, making it difficult to walk. Previous treatments have not been effective. -Continue taking gabapentin 300 mg three times daily. -Tramadol  prescription refilled with 30 tablets for severe pain; use sparingly. -Engage in daily stretching and core strengthening exercises. -Try chair yoga and back pain exercises available on YouTube. -You have been referred to a pain management clinic for further evaluation on February 20th.  IDIOPATHIC URTICARIA: You have persistent itching despite treatment with Xolair  injections and Singulair . -Continue Xolair  injections every 28 days. -Continue taking Singulair  as prescribed.  TYPE 2 DIABETES MELLITUS: Your diabetes is well-controlled with a recent A1c of 5.7%. -Continue monitoring your blood sugar levels and maintain your current lifestyle and diet.  HYPERLIPIDEMIA: Your cholesterol levels are slightly elevated but not concerning at this time. -Continue with your current diet and lifestyle. No medication needed at this time.  HYPOTHYROIDISM: Your thyroid  function is improving and you do not need medication currently. -No medication required at this time. Continue monitoring your thyroid  function.  GENERAL HEALTH MAINTENANCE: Your recent mammogram was normal, and your blood work showed normal kidney and liver function, slightly elevated cholesterol, and borderline blood sugar levels. -Continue with annual mammograms. -No immediate need for repeat blood work.                      Contains text generated by Abridge.                                  Contains text generated by Abridge.   "

## 2024-10-12 NOTE — Progress Notes (Unsigned)
 "  Subjective:  Patient ID: Tina Perkins, female    DOB: 08/09/1941  Age: 84 y.o. MRN: [id number]  Chief Complaint  Patient presents with   Medical Management of Chronic Issues    HPI: Discussed the use of AI scribe software for clinical note transcription with the patient, who gave verbal consent to proceed.  History of Present Illness   Tina Perkins is an 84 year old female with idiopathic urticaria and chronic back pain who presents for follow-up on her conditions.   Pruritus and urticaria - Idiopathic urticaria with persistent pruritus despite Xolair  injections every 28 days - Episodes of itching occur in various locations - Singulair  is taken for symptom management  Chronic back pain with radiculopathy - Chronic, severe back pain attributed to scoliosis and two pinched nerves - Pain is constant, radiates from back down both legs to toes, left leg more affected - Burning sensation described as 'somebody's put a rod burning me' - Pain impairs ambulation, causing her to bend and fall over after 5-10 minutes of walking - Gabapentin 300 mg three times daily and tramadol  as needed for pain control - Physical therapy, chiropractic care, and injections have not provided lasting relief - No loss of sensation in feet despite pain radiating down legs  Recent laboratory and imaging findings - November blood work showed good kidney and liver function - Slightly elevated cholesterol - Borderline blood glucose with A1c of 5.7 - Improved thyroid  function; not taking thyroid  medication - Normal mammogram on November 18th  Gastrointestinal symptoms - No abdominal pain            10/12/2024   10:03 AM 08/17/2024   11:06 AM 05/17/2024   10:32 AM 01/30/2024   10:13 AM 05/31/2023   11:21 AM  Depression screen PHQ 2/9  Decreased Interest 0 0 0 2 2  Down, Depressed, Hopeless 1 0 0 3 1  PHQ - 2 Score 1 0 0 5 3  Altered sleeping 3   3 3   Tired, decreased energy 3   3 3   Change in appetite  0   0 2  Feeling bad or failure about yourself  0   0 0  Trouble concentrating 2   3 0  Moving slowly or fidgety/restless 0   0 0  Suicidal thoughts 0   0 0  PHQ-9 Score 9   14  11    Difficult doing work/chores Not difficult at all   Not difficult at all Not difficult at all     Data saved with a previous flowsheet row definition        10/12/2024   10:03 AM  Fall Risk   Falls in the past year? 0  Number falls in past yr: 0  Injury with Fall? 0  Risk for fall due to : No Fall Risks  Follow up Falls evaluation completed    Patient Care Team: Konya Fauble, MD as PCP - General (Family Medicine)   Review of Systems  Constitutional:  Negative for chills, fatigue and fever.  HENT:  Negative for congestion, ear pain, sinus pressure and sore throat.   Respiratory:  Negative for cough and shortness of breath.   Cardiovascular:  Negative for chest pain.  Gastrointestinal:  Negative for abdominal pain, constipation, diarrhea, nausea and vomiting.  Genitourinary:  Negative for dysuria and frequency.  Musculoskeletal:  Positive for back pain and myalgias. Negative for arthralgias.  Neurological:  Negative for dizziness and headaches.  Psychiatric/Behavioral:  Negative for dysphoric mood. The patient  is not nervous/anxious.     Medications Ordered Prior to Encounter[1] Past Medical History:  Diagnosis Date   Acute respiratory disease due to COVID-19 virus 08/12/2019   BMI 35.0-35.9,adult 03/31/2020   Chest pain, precordial 04/10/2020   Chronic bilateral low back pain with bilateral sciatica 09/15/2021   Last Assessment & Plan:  Formatting of this note might be different from the original. 84 year old female seen today in initial consultation for her chronic back pain with bilateral lower extremity radiculopathy, left greater than right.    Will request previous lumbar spine MRI for review.  At this time, I will start patient on gabapentin 100 mg q.h.s. x2 weeks then increase her to 200  mg q.h.s.    Coronary artery disease involving native coronary artery of native heart without angina pectoris 05/10/2016   Apparently coronary intervention done in 1993 in [place]  She was fine to have 75% narrowing of the mid LAD, nitroglycerin  was given reduction to 30-40% it was also described as bridging in that area   CVA (cerebral vascular accident) (HCC)    2020   Daytime somnolence 09/15/2020   Essential hypertension 04/10/2020   History of hepatitis 12/14/2019   Hypovitaminosis D 10/29/2021   Mixed hyperlipidemia 12/14/2019   Neck pain 09/15/2021   Last Assessment & Plan:  Formatting of this note might be different from the original. Atraumatic, acute onset cervical spine pain and stiffness x 2-3 weeks which has been progressive without improvement.  No associated fevers or chills.  Associated headaches, left shoulder, and left upper extremity pain that radiates down into her hand and fingers. This pain is most likely radicular in nature.      Obesity (BMI 30-39.9) 04/10/2020   Pain medication agreement 09/15/2021   Last Assessment & Plan:  Formatting of this note might be different from the original. Agreement signed and placed in the patient's chart today..  UDS to be collected in compliance with clinic policies and procedures.   Patient did not display any signs of abuse or diversion and has been checked on the [place]  Controlled Substance website.   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 Added automatically from request for surgery [id number] Formatting of this note  might be different from the original. Added automatically from request for surgery [id number]   Past Surgical History:  Procedure Laterality Date   CESAREAN SECTION N/A    COLON SURGERY     TOTAL KNEE ARTHROPLASTY Bilateral    2017 and 2019    Family History  Problem Relation Age of Onset   Cancer Mother    Heart attack Mother    Heart attack Father    Cancer Father    Cancer Sister    Cancer Brother    Cancer Daughter    Social History   Socioeconomic History   Marital status: Single    Spouse name: Not on file   Number of children: 11   Years of education: 9   Highest education level: 9th grade  Occupational History   Occupation: Retired  Tobacco Use   Smoking status: Never  Passive exposure: Never   Smokeless tobacco: Never  Vaping Use   Vaping status: Never Used  Substance and Sexual Activity   Alcohol use: Never   Drug use: Never   Sexual activity: Not Currently  Other Topics Concern   Not on file  Social History Narrative   Not on file   Social Drivers of Health   Tobacco Use: Low Risk (10/12/2024)   Patient History    Smoking Tobacco Use: Never    Smokeless Tobacco Use: Never    Passive Exposure: Never  Financial Resource Strain: Low Risk (05/17/2024)   Overall Financial Resource Strain (CARDIA)    Difficulty of Paying Living Expenses: Not hard at all  Food Insecurity: No Food Insecurity (05/17/2024)   Epic    Worried About Programme Researcher, Broadcasting/film/video in the Last Year: Never true    Ran Out of Food in the Last Year: Never true  Transportation Needs: No Transportation Needs (05/17/2024)   Epic    Lack of Transportation (Medical): No    Lack of Transportation (Non-Medical): No  Physical Activity: Inactive (05/17/2024)   Exercise Vital Sign    Days of Exercise per Week: 0 days    Minutes of Exercise per Session: 0 min  Stress: No Stress Concern Present (05/17/2024)   [employer] of Occupational Health - Occupational Stress Questionnaire    Feeling of  Stress: Not at all  Social Connections: Socially Isolated (05/17/2024)   Social Connection and Isolation Panel    Frequency of Communication with Friends and Family: More than three times a week    Frequency of Social Gatherings with Friends and Family: More than three times a week    Attends Religious Services: Never    Database Administrator or Organizations: No    Attends Banker Meetings: Never    Marital Status: Widowed  Depression (PHQ2-9): Medium Risk (10/12/2024)   Depression (PHQ2-9)    PHQ-2 Score: 9  Alcohol Screen: Low Risk (05/17/2024)   Alcohol Screen    Last Alcohol Screening Score (AUDIT): 0  Housing: Low Risk (05/17/2024)   Epic    Unable to Pay for Housing in the Last Year: No    Number of Times Moved in the Last Year: 0    Homeless in the Last Year: No  Utilities: Not At Risk (05/17/2024)   Epic    Threatened with loss of utilities: No  Health Literacy: Adequate Health Literacy (05/17/2024)   B1300 Health Literacy    Frequency of need for help with medical instructions: Never    Objective:  BP 114/88   Pulse 66   Temp 98.4 F (36.9 C)   Ht 4' 11 (1.499 m)   Wt 174 lb 4.8 oz (79.1 kg)   SpO2 98%   BMI 35.20 kg/m      10/12/2024   10:00 AM 08/24/2024   11:05 AM 08/17/2024   11:02 AM  BP/Weight  Systolic BP 114 120 118  Diastolic BP 88 76 72  Wt. (Lbs) 174.3  174.2  BMI 35.2 kg/m2  35.18 kg/m2    Physical Exam Vitals and nursing note reviewed.  Constitutional:      Appearance: She is obese.  HENT:     Head: Normocephalic and atraumatic.  Cardiovascular:     Rate and Rhythm: Normal rate and regular rhythm.  Musculoskeletal:        General: Tenderness (low back pain, left low back tenderness) present. Normal range of motion.  Neurological:  General: No focal deficit present.     Mental Status: She is alert and oriented to person, place, and time.      Diabetic foot exam was performed with the following findings:   No  deformities, ulcerations, or other skin breakdown Normal sensation of 10g monofilament Intact posterior tibialis and dorsalis pedis pulses      Lab Results  Component Value Date   WBC 7.5 08/17/2024   HGB 14.2 08/17/2024   HCT 43.9 08/17/2024   PLT 250 08/17/2024   GLUCOSE 133 (H) 08/17/2024   CHOL 179 08/17/2024   TRIG 174 (H) 08/17/2024   HDL 46 08/17/2024   LDLCALC 103 (H) 08/17/2024   ALT 19 08/17/2024   AST 20 08/17/2024   NA 139 08/17/2024   K 4.0 08/17/2024   CL 103 08/17/2024   CREATININE 0.79 08/17/2024   BUN 17 08/17/2024   CO2 23 08/17/2024   TSH 0.402 (L) 08/17/2024   HGBA1C 5.7 (H) 08/17/2024    Results for orders placed or performed in visit on 08/22/24  [facility] MAMMOGRAPHY   Collection Time: 08/21/24 10:27 AM  Result Value Ref Range   [facility] Mammogram 0-4 Bi-Rad 0-4 Bi-Rad, Self Reported Normal  .  Assessment & Plan:   Assessment & Plan Hyperlipidemia associated with type 2 diabetes mellitus (HCC) Not on a statin inspite of diabetes diagnosis. Last LDL at 103. Good HDL. But given her age, not sure if she would need to be on a statin. Will consider if LDL rises.      Diabetes mellitus type 2, controlled, without complications (HCC) Type 2 diabetes mellitus, well-controlled without complications, without current long term use of insulin , with recent A1c of 5.7%.  Not on any medications to lower sugars.     Class 2 severe obesity due to excess calories with serious comorbidity and body mass index (BMI) of 35.0 to 35.9 in adult Class 2 obesity with BMI 35.20 with comorbidities of Diabetes type 2 and GERD. Cannot exercise due to chronic pain. Generally eats healthy. Encouraged to continue healthy lifestyle.     Chronic bilateral low back pain with bilateral sciatica Chronic back pain with lumbar scoliosis and radiculopathy Chronic back pain with lumbar scoliosis and radiculopathy, severe and persistent, radiating to both legs, with burning sensation and  difficulty walking. Previous interventions, including physical therapy and chiropractic care, have been ineffective. Surgical intervention is not recommended due to risks. Pain management is challenging due to severity and impact on daily activities. - Continue gabapentin 300 mg three times daily. - Refilled tramadol  30 tablets for severe pain, advised to use sparingly. - Encouraged daily stretching and core strengthening exercises. - Recommended chair yoga and back pain exercises from YouTube. - Referred to pain management clinic for further evaluation on February 20th.    Chronic idiopathic urticaria Idiopathic urticaria Persistent itching despite treatment with Xolair  injections and Singulair . Recent Xolair  injection on December 11th. Cost of treatment is a concern, but copay may be covered at the beginning of the year. - Continue Xolair  injections every 28 days. - Continue Singulair  as prescribed.      hypothyroidism Improving thyroid  function, no current medication required.  General Health Maintenance Recent mammogram on November 18th was normal. Blood work in November showed normal kidney and liver function, slightly elevated cholesterol, and borderline blood sugar levels. - Continue annual mammograms. - No immediate need for repeat blood work.        Body mass index is 35.2 kg/m.  Meds ordered this encounter  Medications   traMADol  (ULTRAM ) 50 MG tablet    Sig: Take 1 tablet (50 mg total) by mouth at bedtime as needed for severe pain (pain score 7-10).    Dispense:  30 tablet    Refill:  0    No orders of the defined types were placed in this encounter.      Follow-up: Return in about 3 months (around 01/10/2025) for chronic disease follow up.  An After Visit Summary was printed and given to the patient.  Tommy Schimke, MD [facility] [phone]     [1]  Current Outpatient Medications on File Prior to Visit  Medication Sig Dispense  Refill   aspirin EC 81 MG tablet Take 81 mg by mouth daily. Swallow whole.     EPINEPHrine  0.3 mg/0.3 mL IJ SOAJ injection Inject 0.3 mg into the muscle as needed for anaphylaxis. 1 each 1   famotidine  (PEPCID ) 20 MG tablet Take 1 tablet (20 mg total) by mouth 2 (two) times daily. 60 tablet 5   fexofenadine  (ALLEGRA ) 180 MG tablet Take 1 tablet (180 mg total) by mouth daily. 90 tablet 5   gabapentin (NEURONTIN) 300 MG capsule Take 300 mg by mouth at bedtime.     meclizine  (ANTIVERT ) 12.5 MG tablet TAKE 1 TABLET BY MOUTH THREE TIMES DAILY AS NEEDED FOR DIZZINESS 30 tablet 5   nitroGLYCERIN  (NITROSTAT ) 0.4 MG SL tablet Place 1 tablet (0.4 mg total) under the tongue every 5 (five) minutes as needed for chest pain. 90 tablet 3   ondansetron  (ZOFRAN ) 4 MG tablet Take 1 tablet (4 mg total) by mouth every 8 (eight) hours as needed for nausea or vomiting. 20 tablet 0   Vitamin D , Ergocalciferol , (DRISDOL ) 1.25 MG (50000 UNIT) CAPS capsule Take 50,000 Units by mouth 2 (two) times a week.     montelukast  (SINGULAIR ) 10 MG tablet Take 1 tablet (10 mg total) by mouth at bedtime. (Patient not taking: Reported on 10/12/2024) 90 tablet 1   Current Facility-Administered Medications on File Prior to Visit  Medication Dose Route Frequency Provider Last Rate Last Admin   omalizumab  (XOLAIR ) prefilled syringe 600 mg  600 mg Subcutaneous Q28 days Lorin Norris, MD   600 mg at 10/11/24 1059   "

## 2024-10-12 NOTE — Assessment & Plan Note (Signed)
 SABRA

## 2024-10-12 NOTE — Assessment & Plan Note (Signed)
 Tina Perkins

## 2024-10-13 NOTE — Assessment & Plan Note (Signed)
 Idiopathic urticaria Persistent itching despite treatment with Xolair  injections and Singulair . Recent Xolair  injection on December 11th. Cost of treatment is a concern, but copay may be covered at the beginning of the year. - Continue Xolair  injections every 28 days. - Continue Singulair  as prescribed.

## 2024-10-13 NOTE — Assessment & Plan Note (Signed)
 Chronic back pain with lumbar scoliosis and radiculopathy Chronic back pain with lumbar scoliosis and radiculopathy, severe and persistent, radiating to both legs, with burning sensation and difficulty walking. Previous interventions, including physical therapy and chiropractic care, have been ineffective. Surgical intervention is not recommended due to risks. Pain management is challenging due to severity and impact on daily activities. - Continue gabapentin 300 mg three times daily. - Refilled tramadol  30 tablets for severe pain, advised to use sparingly. - Encouraged daily stretching and core strengthening exercises. - Recommended chair yoga and back pain exercises from YouTube. - Referred to pain management clinic for further evaluation on February 20th.

## 2024-10-15 ENCOUNTER — Telehealth: Payer: Self-pay | Admitting: Internal Medicine

## 2024-10-15 NOTE — Telephone Encounter (Signed)
 Patient states she got her Xolair  last Thursday, 1/8. Friday morning she had an appointment with her PCP and her wrists/hands started to itch and swell. Not too long after, the top of her feet started itching and swelling. By Friday night, her feet and hands were HUGE. She took allergy medication but it did not help. The areas were very red and flaming hot, as well. Sunday, the swelling went down but the itching was still there.

## 2024-10-16 ENCOUNTER — Ambulatory Visit

## 2024-10-16 VITALS — BP 110/78 | HR 70 | Temp 97.5°F | Ht 59.0 in | Wt 175.0 lb

## 2024-10-16 DIAGNOSIS — M5441 Lumbago with sciatica, right side: Secondary | ICD-10-CM | POA: Diagnosis not present

## 2024-10-16 DIAGNOSIS — M79671 Pain in right foot: Secondary | ICD-10-CM | POA: Insufficient documentation

## 2024-10-16 DIAGNOSIS — M5442 Lumbago with sciatica, left side: Secondary | ICD-10-CM

## 2024-10-16 DIAGNOSIS — G8929 Other chronic pain: Secondary | ICD-10-CM | POA: Diagnosis not present

## 2024-10-16 MED ORDER — KETOROLAC TROMETHAMINE 60 MG/2ML IM SOLN
60.0000 mg | Freq: Once | INTRAMUSCULAR | Status: AC
  Administered 2024-10-16: 60 mg via INTRAMUSCULAR

## 2024-10-16 MED ORDER — METHYLPREDNISOLONE 4 MG PO TBPK: ORAL_TABLET | ORAL | 1 refills | Status: AC

## 2024-10-16 NOTE — Assessment & Plan Note (Signed)
 Chronic low back pain with right-sided sciatica, possibly contributing to neuropathic symptoms in the right foot.ordered blood work to rule out acute infectious etiology for her worsening pain. Orders:   CBC with Differential/Platelet   Comprehensive metabolic panel with GFR

## 2024-10-16 NOTE — Progress Notes (Unsigned)
 "  Subjective:  Patient ID: Tina Perkins, female    DOB: [birth year]  Age: 84 y.o. MRN: [id number]  Chief Complaint  Patient presents with   Leg Pain    HPI: Discussed the use of AI scribe software for clinical note transcription with the patient, who gave verbal consent to proceed.   Discussed the use of AI scribe software for clinical note transcription with the patient, who gave verbal consent to proceed.  History of Present Illness   Tina Perkins is an 84 year old female who presents with severe pain and swelling in her right leg and foot.  Right lower extremity pain and swelling - Severe pain and swelling in the right leg and foot since Saturday night - Initial onset with redness and itching, progressing to severe swelling and pain - Pain described as feeling like her foot is in a vise with a pipe running through it, radiating up the leg with 'sparks' of pain - Unable to bear weight on the right foot, requiring assistance for mobility and use of a wheelchair - No recent falls or injuries to the foot or ankle - Persistent pain interfering with sleep - Minimal relief with lotion, cold rags, and elevation  Left hand swelling - Initial swelling of the left hand, which has since improved  Cough with sputum production - New onset of cough with significant mucus production for several days - Episodes of coughing and choking, particularly at night  History of thromboembolic disease - History of blood clots, last occurrence approximately forty years ago  Recent medication and laboratory history - No recent use of prednisone , though used in the past - No recent blood work since November           10/16/2024    2:17 PM 10/12/2024   10:03 AM 08/17/2024   11:06 AM 05/17/2024   10:32 AM 01/30/2024   10:13 AM  Depression screen PHQ 2/9  Decreased Interest 0 0 0 0 2  Down, Depressed, Hopeless 0 1 0 0 3  PHQ - 2 Score 0 1 0 0 5  Altered sleeping  3   3  Tired, decreased energy  3    3  Change in appetite  0   0  Feeling bad or failure about yourself   0   0  Trouble concentrating  2   3  Moving slowly or fidgety/restless  0   0  Suicidal thoughts  0   0  PHQ-9 Score  9   14   Difficult doing work/chores  Not difficult at all   Not difficult at all     Data saved with a previous flowsheet row definition        10/16/2024    2:17 PM  Fall Risk   Falls in the past year? 0  Number falls in past yr: 0  Injury with Fall? 0  Risk for fall due to : No Fall Risks  Follow up Falls evaluation completed    Patient Care Team: Cutler Sunday, MD as PCP - General (Family Medicine)   Review of Systems  Constitutional:  Negative for chills, fatigue and fever.  HENT:  Negative for congestion, ear pain, sinus pressure and sore throat.   Respiratory:  Negative for cough and shortness of breath.   Cardiovascular:  Negative for chest pain and leg swelling.  Gastrointestinal:  Negative for abdominal pain, constipation, diarrhea, nausea and vomiting.  Genitourinary:  Negative for dysuria and frequency.  Musculoskeletal:  Positive  for arthralgias, back pain, gait problem and myalgias.       Right foot pain  Skin:  Positive for color change.  Neurological:  Negative for dizziness and headaches.  Psychiatric/Behavioral:  Negative for dysphoric mood. The patient is not nervous/anxious.     Medications Ordered Prior to Encounter[1] Past Medical History:  Diagnosis Date   Acute respiratory disease due to COVID-19 virus 08/12/2019   BMI 35.0-35.9,adult 03/31/2020   Chest pain, precordial 04/10/2020   Chronic bilateral low back pain with bilateral sciatica 09/15/2021   Last Assessment & Plan:  Formatting of this note might be different from the original. 84 year old female seen today in initial consultation for her chronic back pain with bilateral lower extremity radiculopathy, left greater than right.    Will request previous lumbar spine MRI for review.  At this time, I will  start patient on gabapentin 100 mg q.h.s. x2 weeks then increase her to 200 mg q.h.s.    Coronary artery disease involving native coronary artery of native heart without angina pectoris 05/10/2016   Apparently coronary intervention done in 1993 in [place]  She was fine to have 75% narrowing of the mid LAD, nitroglycerin  was given reduction to 30-40% it was also described as bridging in that area   CVA (cerebral vascular accident) (HCC)    2020   Daytime somnolence 09/15/2020   Essential hypertension 04/10/2020   History of hepatitis 12/14/2019   Hypovitaminosis D 10/29/2021   Mixed hyperlipidemia 12/14/2019   Neck pain 09/15/2021   Last Assessment & Plan:  Formatting of this note might be different from the original. Atraumatic, acute onset cervical spine pain and stiffness x 2-3 weeks which has been progressive without improvement.  No associated fevers or chills.  Associated headaches, left shoulder, and left upper extremity pain that radiates down into her hand and fingers. This pain is most likely radicular in nature.      Obesity (BMI 30-39.9) 04/10/2020   Pain medication agreement 09/15/2021   Last Assessment & Plan:  Formatting of this note might be different from the original. Agreement signed and placed in the patient's chart today..  UDS to be collected in compliance with clinic policies and procedures.   Patient did not display any signs of abuse or diversion and has been checked on the [place]  Controlled Substance website.   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  Added automatically from request for surgery [id number] Formatting of this note might be different from the original. Added automatically from request for surgery [id number]   Past Surgical History:  Procedure Laterality Date   CESAREAN SECTION N/A    COLON SURGERY     TOTAL KNEE ARTHROPLASTY Bilateral    2017 and 2019    Family History  Problem Relation Age of Onset   Cancer Mother    Heart attack Mother    Heart attack Father    Cancer Father    Cancer Sister    Cancer Brother    Cancer Daughter    Social History   Socioeconomic History   Marital status: Single  Spouse name: Not on file   Number of children: 11   Years of education: 9   Highest education level: 9th grade  Occupational History   Occupation: Retired  Tobacco Use   Smoking status: Never    Passive exposure: Never   Smokeless tobacco: Never  Vaping Use   Vaping status: Never Used  Substance and Sexual Activity   Alcohol use: Never   Drug use: Never   Sexual activity: Not Currently  Other Topics Concern   Not on file  Social History Narrative   Not on file   Social Drivers of Health   Tobacco Use: Low Risk (10/16/2024)   Patient History    Smoking Tobacco Use: Never    Smokeless Tobacco Use: Never    Passive Exposure: Never  Financial Resource Strain: Low Risk (05/17/2024)   Overall Financial Resource Strain (CARDIA)    Difficulty of Paying Living Expenses: Not hard at all  Food Insecurity: No Food Insecurity (05/17/2024)   Epic    Worried About Programme Researcher, Broadcasting/film/video in the Last Year: Never true    Ran Out of Food in the Last Year: Never true  Transportation Needs: No Transportation Needs (05/17/2024)   Epic    Lack of Transportation (Medical): No    Lack of Transportation (Non-Medical): No  Physical Activity: Inactive (05/17/2024)   Exercise Vital Sign    Days of Exercise per Week: 0 days    Minutes of Exercise per Session: 0 min  Stress: No Stress Concern Present (05/17/2024)   [employer] of  Occupational Health - Occupational Stress Questionnaire    Feeling of Stress: Not at all  Social Connections: Socially Isolated (05/17/2024)   Social Connection and Isolation Panel    Frequency of Communication with Friends and Family: More than three times a week    Frequency of Social Gatherings with Friends and Family: More than three times a week    Attends Religious Services: Never    Database Administrator or Organizations: No    Attends Banker Meetings: Never    Marital Status: Widowed  Depression (PHQ2-9): Low Risk (10/16/2024)   Depression (PHQ2-9)    PHQ-2 Score: 0  Recent Concern: Depression (PHQ2-9) - Medium Risk (10/12/2024)   Depression (PHQ2-9)    PHQ-2 Score: 9  Alcohol Screen: Low Risk (05/17/2024)   Alcohol Screen    Last Alcohol Screening Score (AUDIT): 0  Housing: Low Risk (05/17/2024)   Epic    Unable to Pay for Housing in the Last Year: No    Number of Times Moved in the Last Year: 0    Homeless in the Last Year: No  Utilities: Not At Risk (05/17/2024)   Epic    Threatened with loss of utilities: No  Health Literacy: Adequate Health Literacy (05/17/2024)   B1300 Health Literacy    Frequency of need for help with medical instructions: Never    Objective:  BP 110/78   Pulse 70   Temp (!) 97.5 F (36.4 C)   Ht 4' 11 (1.499 m)   Wt 175 lb (79.4 kg)   SpO2 98%   BMI 35.35 kg/m      10/16/2024    2:15 PM 10/12/2024   10:00 AM 08/24/2024   11:05 AM  BP/Weight  Systolic BP 110 114 120  Diastolic BP 78 88 76  Wt. (Lbs) 175 174.3   BMI 35.35 kg/m2 35.2 kg/m2     Physical Exam Vitals and nursing note reviewed.  Constitutional:      Appearance: Normal appearance.     Comments: Sitting in wheel chair  HENT:     Head: Normocephalic and atraumatic.  Cardiovascular:     Rate and Rhythm: Normal rate and regular rhythm.  Pulmonary:     Effort: Pulmonary effort is normal.     Breath sounds: Normal breath sounds.  Musculoskeletal:      Comments: No tenderness noted in the calves No warmth or swelling noted, but with plantar or dorsiflexion of the foot, there is pain in the right foot and behind right knee in the popliteal region  Skin:    Comments: No significant skin changes newly noticed. There might be a slight tinge of erythema on the right mid foot BUT DOES NOT LOOK LIKE CELLULITIS AT THIS TIME.   Neurological:     Mental Status: She is alert.         Lab Results  Component Value Date   WBC 7.5 08/17/2024   HGB 14.2 08/17/2024   HCT 43.9 08/17/2024   PLT 250 08/17/2024   GLUCOSE 133 (H) 08/17/2024   CHOL 179 08/17/2024   TRIG 174 (H) 08/17/2024   HDL 46 08/17/2024   LDLCALC 103 (H) 08/17/2024   ALT 19 08/17/2024   AST 20 08/17/2024   NA 139 08/17/2024   K 4.0 08/17/2024   CL 103 08/17/2024   CREATININE 0.79 08/17/2024   BUN 17 08/17/2024   CO2 23 08/17/2024   TSH 0.402 (L) 08/17/2024   HGBA1C 5.7 (H) 08/17/2024    Results for orders placed or performed in visit on 08/22/24  [facility] MAMMOGRAPHY   Collection Time: 08/21/24 10:27 AM  Result Value Ref Range   HM Mammogram 0-4 Bi-Rad 0-4 Bi-Rad, Self Reported Normal  .  Assessment & Plan:   Assessment & Plan Right foot pain  Right foot pain with suspected neuropathic etiology Acute right foot pain with severe, vise-like, and shooting characteristics, exacerbated by weight-bearing. No signs of infection or cellulitis. Differential includes neuropathy, possible nerve impingement, or other non-infectious causes. No evidence of deep vein thrombosis or cellulitis. Pain onset was sudden, with significant functional impairment. - Administered Toradol  injection for pain relief - Prescribed Medrol  Dosepak for inflammation - Ordered CBC to rule out infection - Advised to monitor for increased redness or swelling and report if symptoms worsen - Instructed to go to the emergency department if symptoms become severe Orders:   CBC with Differential/Platelet    Comprehensive metabolic panel with GFR  Chronic bilateral low back pain with bilateral sciatica Chronic low back pain with right-sided sciatica, possibly contributing to neuropathic symptoms in the right foot.ordered blood work to rule out acute infectious etiology for her worsening pain. Orders:   CBC with Differential/Platelet   Comprehensive metabolic panel with GFR  Acute cough Recent onset of acute cough with production of phlegm, occurring at night and causing sleep disturbance. No signs of respiratory distress or abnormal lung sounds. Cough is new and has been ongoing for a few days. - Continue to monitor symptoms and report if condition worsens       Assessment & Plan    Body mass index is 35.35 kg/m.   Meds ordered this encounter  Medications   methylPREDNISolone  (MEDROL  DOSEPAK) 4 MG TBPK tablet    Sig: 6 tabs on day 1, 5 tabs on day 2, 4 tabs on day 3, 3 tabs on day 4, 2 tabs on day 5,  1 tab on day 6.  Dispense:  21 tablet    Refill:  1    Orders Placed This Encounter  Procedures   CBC with Differential/Platelet   Comprehensive metabolic panel with GFR       Follow-up: No follow-ups on file.  An After Visit Summary was printed and given to the patient.  Tommy Schimke, MD [facility] [phone]     [1]  Current Outpatient Medications on File Prior to Visit  Medication Sig Dispense Refill   aspirin EC 81 MG tablet Take 81 mg by mouth daily. Swallow whole.     EPINEPHrine  0.3 mg/0.3 mL IJ SOAJ injection Inject 0.3 mg into the muscle as needed for anaphylaxis. 1 each 1   famotidine  (PEPCID ) 20 MG tablet Take 1 tablet (20 mg total) by mouth 2 (two) times daily. 60 tablet 5   fexofenadine  (ALLEGRA ) 180 MG tablet Take 1 tablet (180 mg total) by mouth daily. 90 tablet 5   gabapentin (NEURONTIN) 300 MG capsule Take 300 mg by mouth at bedtime.     meclizine  (ANTIVERT ) 12.5 MG tablet TAKE 1 TABLET BY MOUTH THREE TIMES DAILY AS NEEDED FOR DIZZINESS  30 tablet 5   nitroGLYCERIN  (NITROSTAT ) 0.4 MG SL tablet Place 1 tablet (0.4 mg total) under the tongue every 5 (five) minutes as needed for chest pain. 90 tablet 3   ondansetron  (ZOFRAN ) 4 MG tablet Take 1 tablet (4 mg total) by mouth every 8 (eight) hours as needed for nausea or vomiting. 20 tablet 0   traMADol  (ULTRAM ) 50 MG tablet Take 1 tablet (50 mg total) by mouth at bedtime as needed for severe pain (pain score 7-10). 30 tablet 0   Vitamin D , Ergocalciferol , (DRISDOL ) 1.25 MG (50000 UNIT) CAPS capsule Take 50,000 Units by mouth 2 (two) times a week.     montelukast  (SINGULAIR ) 10 MG tablet Take 1 tablet (10 mg total) by mouth at bedtime. (Patient not taking: Reported on 10/12/2024) 90 tablet 1   Current Facility-Administered Medications on File Prior to Visit  Medication Dose Route Frequency Provider Last Rate Last Admin   omalizumab  (XOLAIR ) prefilled syringe 600 mg  600 mg Subcutaneous Q28 days Lorin Norris, MD   600 mg at 10/11/24 1059   "

## 2024-10-16 NOTE — Patient Instructions (Signed)
" °  VISIT SUMMARY: During your visit, we addressed the severe pain and swelling in your right leg and foot, as well as a new cough with mucus production. We also discussed your history of thromboembolic disease and recent medication history.  YOUR PLAN: RIGHT FOOT PAIN WITH SUSPECTED NEUROPATHIC ETIOLOGY: You have severe pain and swelling in your right leg and foot, which may be due to nerve-related issues. -You received a Toradol  injection for pain relief. -You were prescribed a Medrol  Dosepak to reduce inflammation. -A CBC blood test was ordered to rule out infection. -Monitor for increased redness or swelling and report if symptoms worsen. -Go to the emergency department if symptoms become severe.  CHRONIC LOW BACK PAIN WITH RIGHT-SIDED SCIATICA: Your chronic low back pain with right-sided sciatica may be contributing to the neuropathic symptoms in your right foot. -Continue to manage your chronic low back pain as previously discussed.  ACUTE COUGH: You have a new cough with mucus production that has been disturbing your sleep. -Continue to monitor your symptoms and report if your condition worsens.                      Contains text generated by Abridge.                                 Contains text generated by Abridge.   "

## 2024-10-16 NOTE — Assessment & Plan Note (Signed)
  Orders:   CBC with Differential/Platelet   Comprehensive metabolic panel with GFR

## 2024-10-17 LAB — COMPREHENSIVE METABOLIC PANEL WITH GFR
ALT: 21 IU/L (ref 0–32)
AST: 19 IU/L (ref 0–40)
Albumin: 4 g/dL (ref 3.7–4.7)
Alkaline Phosphatase: 54 IU/L (ref 48–129)
BUN/Creatinine Ratio: 17 (ref 12–28)
BUN: 11 mg/dL (ref 8–27)
Bilirubin Total: 0.4 mg/dL (ref 0.0–1.2)
CO2: 18 mmol/L — ABNORMAL LOW (ref 20–29)
Calcium: 8.7 mg/dL (ref 8.7–10.3)
Chloride: 107 mmol/L — ABNORMAL HIGH (ref 96–106)
Creatinine, Ser: 0.64 mg/dL (ref 0.57–1.00)
Globulin, Total: 2.2 g/dL (ref 1.5–4.5)
Glucose: 167 mg/dL — ABNORMAL HIGH (ref 70–99)
Potassium: 3.9 mmol/L (ref 3.5–5.2)
Sodium: 139 mmol/L (ref 134–144)
Total Protein: 6.2 g/dL (ref 6.0–8.5)
eGFR: 88 mL/min/1.73

## 2024-10-17 LAB — CBC WITH DIFFERENTIAL/PLATELET
Basophils Absolute: 0.1 x10E3/uL (ref 0.0–0.2)
Basos: 1 %
EOS (ABSOLUTE): 0.2 x10E3/uL (ref 0.0–0.4)
Eos: 3 %
Hematocrit: 41.6 % (ref 34.0–46.6)
Hemoglobin: 13.3 g/dL (ref 11.1–15.9)
Immature Grans (Abs): 0 x10E3/uL (ref 0.0–0.1)
Immature Granulocytes: 0 %
Lymphocytes Absolute: 1.4 x10E3/uL (ref 0.7–3.1)
Lymphs: 17 %
MCH: 28.7 pg (ref 26.6–33.0)
MCHC: 32 g/dL (ref 31.5–35.7)
MCV: 90 fL (ref 79–97)
Monocytes Absolute: 0.7 x10E3/uL (ref 0.1–0.9)
Monocytes: 8 %
Neutrophils Absolute: 6.3 x10E3/uL (ref 1.4–7.0)
Neutrophils: 71 %
Platelets: 227 x10E3/uL (ref 150–450)
RBC: 4.63 x10E6/uL (ref 3.77–5.28)
RDW: 11.6 % — ABNORMAL LOW (ref 11.7–15.4)
WBC: 8.7 x10E3/uL (ref 3.4–10.8)

## 2024-10-17 NOTE — Telephone Encounter (Signed)
 Called and informed patient of Dr. Carita message.  She was not taking anithistamines so I encouraged her to restart them and to keep us  updated.

## 2024-10-18 ENCOUNTER — Ambulatory Visit: Payer: Self-pay

## 2024-10-19 ENCOUNTER — Other Ambulatory Visit: Payer: Self-pay | Admitting: Physician Assistant

## 2024-10-19 DIAGNOSIS — R42 Dizziness and giddiness: Secondary | ICD-10-CM

## 2024-11-08 ENCOUNTER — Ambulatory Visit: Admitting: *Deleted

## 2024-11-08 DIAGNOSIS — L501 Idiopathic urticaria: Secondary | ICD-10-CM

## 2024-12-06 ENCOUNTER — Ambulatory Visit

## 2025-01-15 ENCOUNTER — Ambulatory Visit

## 2025-02-22 ENCOUNTER — Ambulatory Visit: Admitting: Internal Medicine
